# Patient Record
Sex: Female | Born: 1960
Health system: Southern US, Community
[De-identification: ages and names within clinical notes are randomized; demographics above are authoritative.]

## PROBLEM LIST (undated history)

## (undated) DIAGNOSIS — R112 Nausea with vomiting, unspecified: Secondary | ICD-10-CM

## (undated) DIAGNOSIS — B019 Varicella without complication: Secondary | ICD-10-CM

## (undated) DIAGNOSIS — I471 Supraventricular tachycardia, unspecified: Secondary | ICD-10-CM

## (undated) DIAGNOSIS — Z8742 Personal history of other diseases of the female genital tract: Secondary | ICD-10-CM

## (undated) DIAGNOSIS — N809 Endometriosis, unspecified: Secondary | ICD-10-CM

## (undated) DIAGNOSIS — M419 Scoliosis, unspecified: Secondary | ICD-10-CM

## (undated) DIAGNOSIS — N816 Rectocele: Secondary | ICD-10-CM

## (undated) DIAGNOSIS — N393 Stress incontinence (female) (male): Secondary | ICD-10-CM

## (undated) DIAGNOSIS — C4490 Unspecified malignant neoplasm of skin, unspecified: Secondary | ICD-10-CM

## (undated) DIAGNOSIS — T7840XA Allergy, unspecified, initial encounter: Secondary | ICD-10-CM

## (undated) DIAGNOSIS — N951 Menopausal and female climacteric states: Secondary | ICD-10-CM

## (undated) DIAGNOSIS — IMO0002 Reserved for concepts with insufficient information to code with codable children: Secondary | ICD-10-CM

## (undated) DIAGNOSIS — Z973 Presence of spectacles and contact lenses: Secondary | ICD-10-CM

## (undated) DIAGNOSIS — J45909 Unspecified asthma, uncomplicated: Secondary | ICD-10-CM

## (undated) DIAGNOSIS — R32 Unspecified urinary incontinence: Secondary | ICD-10-CM

## (undated) DIAGNOSIS — Z974 Presence of external hearing-aid: Secondary | ICD-10-CM

## (undated) DIAGNOSIS — K589 Irritable bowel syndrome without diarrhea: Secondary | ICD-10-CM

## (undated) DIAGNOSIS — E0500 Thyrotoxicosis with diffuse goiter without thyrotoxic crisis or storm: Secondary | ICD-10-CM

## (undated) DIAGNOSIS — Z83719 Family history of colon polyps, unspecified: Secondary | ICD-10-CM

## (undated) DIAGNOSIS — E785 Hyperlipidemia, unspecified: Secondary | ICD-10-CM

## (undated) DIAGNOSIS — Z9889 Other specified postprocedural states: Secondary | ICD-10-CM

## (undated) DIAGNOSIS — N390 Urinary tract infection, site not specified: Secondary | ICD-10-CM

## (undated) DIAGNOSIS — H7291 Unspecified perforation of tympanic membrane, right ear: Secondary | ICD-10-CM

## (undated) DIAGNOSIS — Z8371 Family history of colonic polyps: Secondary | ICD-10-CM

## (undated) DIAGNOSIS — M1990 Unspecified osteoarthritis, unspecified site: Secondary | ICD-10-CM

## (undated) HISTORY — DX: Varicella without complication: B01.9

## (undated) HISTORY — DX: Hyperlipidemia, unspecified: E78.5

## (undated) HISTORY — DX: Supraventricular tachycardia, unspecified: I47.10

## (undated) HISTORY — DX: Supraventricular tachycardia: I47.1

## (undated) HISTORY — DX: Allergy, unspecified, initial encounter: T78.40XA

## (undated) HISTORY — DX: Unspecified malignant neoplasm of skin, unspecified: C44.90

## (undated) HISTORY — DX: Unspecified urinary incontinence: R32

## (undated) HISTORY — PX: NASOPHARYNGOSCOPY EUSTATION TUBE BALLOON DILATION: SHX6729

## (undated) HISTORY — DX: Stress incontinence (female) (male): N39.3

## (undated) HISTORY — DX: Thyrotoxicosis with diffuse goiter without thyrotoxic crisis or storm: E05.00

## (undated) HISTORY — DX: Family history of colon polyps, unspecified: Z83.719

## (undated) HISTORY — DX: Other specified postprocedural states: Z98.890

## (undated) HISTORY — DX: Urinary tract infection, site not specified: N39.0

## (undated) HISTORY — PX: EYE SURGERY: SHX253

## (undated) HISTORY — PX: THYROIDECTOMY, PARTIAL: SHX18

## (undated) HISTORY — DX: Endometriosis, unspecified: N80.9

## (undated) HISTORY — DX: Family history of colonic polyps: Z83.71

## (undated) HISTORY — DX: Personal history of other diseases of the female genital tract: Z87.42

## (undated) HISTORY — DX: Reserved for concepts with insufficient information to code with codable children: IMO0002

## (undated) HISTORY — DX: Unspecified asthma, uncomplicated: J45.909

## (undated) HISTORY — DX: Rectocele: N81.6

## (undated) HISTORY — PX: TONSILLECTOMY AND ADENOIDECTOMY: SUR1326

## (undated) HISTORY — DX: Irritable bowel syndrome, unspecified: K58.9

## (undated) HISTORY — DX: Menopausal and female climacteric states: N95.1

## (undated) HISTORY — PX: OTHER SURGICAL HISTORY: SHX169

---

## 2004-10-26 ENCOUNTER — Ambulatory Visit: Payer: Self-pay | Admitting: Obstetrics and Gynecology

## 2005-03-01 ENCOUNTER — Ambulatory Visit: Payer: Self-pay | Admitting: Unknown Physician Specialty

## 2005-11-11 ENCOUNTER — Ambulatory Visit: Payer: Self-pay | Admitting: Obstetrics and Gynecology

## 2006-11-14 ENCOUNTER — Ambulatory Visit: Payer: Self-pay | Admitting: Obstetrics and Gynecology

## 2007-11-22 ENCOUNTER — Ambulatory Visit: Payer: Self-pay | Admitting: Obstetrics and Gynecology

## 2008-01-29 ENCOUNTER — Ambulatory Visit: Payer: Self-pay | Admitting: Unknown Physician Specialty

## 2008-02-05 ENCOUNTER — Ambulatory Visit: Payer: Self-pay | Admitting: Gastroenterology

## 2008-04-01 ENCOUNTER — Ambulatory Visit: Payer: Self-pay | Admitting: Unknown Physician Specialty

## 2008-05-01 ENCOUNTER — Ambulatory Visit: Payer: Self-pay | Admitting: Unknown Physician Specialty

## 2008-10-09 ENCOUNTER — Ambulatory Visit: Payer: Self-pay | Admitting: Internal Medicine

## 2008-11-25 ENCOUNTER — Ambulatory Visit: Payer: Self-pay

## 2008-12-02 ENCOUNTER — Ambulatory Visit: Payer: Self-pay | Admitting: Obstetrics and Gynecology

## 2009-02-14 ENCOUNTER — Other Ambulatory Visit: Payer: Self-pay | Admitting: General Practice

## 2009-02-19 ENCOUNTER — Ambulatory Visit: Payer: Self-pay | Admitting: General Practice

## 2009-12-15 ENCOUNTER — Ambulatory Visit: Payer: Self-pay | Admitting: Obstetrics and Gynecology

## 2009-12-16 ENCOUNTER — Ambulatory Visit: Payer: Self-pay | Admitting: Obstetrics and Gynecology

## 2009-12-17 ENCOUNTER — Ambulatory Visit: Payer: Self-pay | Admitting: Obstetrics and Gynecology

## 2009-12-19 ENCOUNTER — Ambulatory Visit: Payer: Self-pay | Admitting: Obstetrics and Gynecology

## 2010-01-13 ENCOUNTER — Ambulatory Visit: Payer: Self-pay | Admitting: Physician Assistant

## 2010-01-14 ENCOUNTER — Ambulatory Visit: Payer: Self-pay | Admitting: General Practice

## 2010-02-04 ENCOUNTER — Ambulatory Visit: Payer: Self-pay | Admitting: Cardiology

## 2011-01-12 ENCOUNTER — Ambulatory Visit: Payer: Self-pay | Admitting: Obstetrics and Gynecology

## 2011-05-20 ENCOUNTER — Ambulatory Visit: Payer: Self-pay

## 2011-05-24 ENCOUNTER — Ambulatory Visit: Payer: Self-pay

## 2011-08-08 ENCOUNTER — Emergency Department: Payer: Self-pay | Admitting: Emergency Medicine

## 2011-08-24 HISTORY — PX: OTHER SURGICAL HISTORY: SHX169

## 2011-09-02 ENCOUNTER — Ambulatory Visit: Payer: Self-pay | Admitting: General Practice

## 2011-09-15 ENCOUNTER — Ambulatory Visit: Payer: Self-pay | Admitting: General Practice

## 2011-09-15 LAB — BASIC METABOLIC PANEL
Anion Gap: 7 (ref 7–16)
BUN: 11 mg/dL (ref 7–18)
Calcium, Total: 9.7 mg/dL (ref 8.5–10.1)
Chloride: 102 mmol/L (ref 98–107)
Creatinine: 0.59 mg/dL — ABNORMAL LOW (ref 0.60–1.30)
EGFR (African American): >60
EGFR (Non-African Amer.): >60
Glucose: 87 mg/dL (ref 65–99)
Osmolality: 280 (ref 275–301)

## 2011-09-15 LAB — HEMOGLOBIN: HGB: 13.9 g/dL (ref 12.0–16.0)

## 2011-09-27 ENCOUNTER — Ambulatory Visit: Payer: Self-pay | Admitting: General Practice

## 2012-01-26 ENCOUNTER — Ambulatory Visit: Payer: Self-pay | Admitting: Obstetrics and Gynecology

## 2012-04-18 ENCOUNTER — Encounter: Payer: Self-pay | Admitting: General Practice

## 2012-04-23 ENCOUNTER — Encounter: Payer: Self-pay | Admitting: General Practice

## 2012-05-24 DIAGNOSIS — N3946 Mixed incontinence: Secondary | ICD-10-CM | POA: Insufficient documentation

## 2012-05-24 DIAGNOSIS — R339 Retention of urine, unspecified: Secondary | ICD-10-CM | POA: Insufficient documentation

## 2012-05-24 DIAGNOSIS — N3020 Other chronic cystitis without hematuria: Secondary | ICD-10-CM | POA: Insufficient documentation

## 2012-06-09 ENCOUNTER — Other Ambulatory Visit: Payer: Self-pay | Admitting: Physician Assistant

## 2012-06-09 LAB — CBC WITH DIFFERENTIAL/PLATELET
Basophil #: 0 10*3/uL (ref 0.0–0.1)
Eosinophil %: 1.1 %
HCT: 40.5 % (ref 35.0–47.0)
Lymphocyte #: 1.8 10*3/uL (ref 1.0–3.6)
MCH: 29.3 pg (ref 26.0–34.0)
MCHC: 34.4 g/dL (ref 32.0–36.0)
MCV: 85 fL (ref 80–100)
Monocyte #: 0.6 x10 3/mm (ref 0.2–0.9)
Monocyte %: 9.2 %
Neutrophil #: 3.9 10*3/uL (ref 1.4–6.5)
Neutrophil %: 61.3 %
Platelet: 241 10*3/uL (ref 150–440)
RBC: 4.76 10*6/uL (ref 3.80–5.20)
WBC: 6.3 10*3/uL (ref 3.6–11.0)

## 2012-06-09 LAB — COMPREHENSIVE METABOLIC PANEL
BUN: 12 mg/dL (ref 7–18)
Bilirubin,Total: 0.6 mg/dL (ref 0.2–1.0)
Calcium, Total: 8.6 mg/dL (ref 8.5–10.1)
Chloride: 105 mmol/L (ref 98–107)
Creatinine: 0.61 mg/dL (ref 0.60–1.30)
EGFR (African American): >60
Potassium: 4.4 mmol/L (ref 3.5–5.1)
SGPT (ALT): 26 U/L (ref 12–78)
Total Protein: 6.6 g/dL (ref 6.4–8.2)

## 2012-06-09 LAB — TSH: Thyroid Stimulating Horm: 0.863 u[IU]/mL

## 2012-06-09 LAB — LIPID PANEL
Cholesterol: 187 mg/dL (ref 0–200)
Ldl Cholesterol, Calc: 127 mg/dL — ABNORMAL HIGH (ref 0–100)
VLDL Cholesterol, Calc: 11 mg/dL (ref 5–40)

## 2012-06-23 ENCOUNTER — Ambulatory Visit: Payer: Self-pay | Admitting: General Practice

## 2013-02-16 ENCOUNTER — Ambulatory Visit: Payer: Self-pay | Admitting: Obstetrics and Gynecology

## 2013-04-23 LAB — HM COLONOSCOPY: HM Colonoscopy: 1

## 2013-05-21 ENCOUNTER — Ambulatory Visit: Payer: Self-pay | Admitting: Unknown Physician Specialty

## 2013-05-23 LAB — PATHOLOGY REPORT

## 2014-01-18 ENCOUNTER — Encounter: Payer: Self-pay | Admitting: Family Medicine

## 2014-01-18 ENCOUNTER — Ambulatory Visit (INDEPENDENT_AMBULATORY_CARE_PROVIDER_SITE_OTHER): Payer: 59 | Admitting: Family Medicine

## 2014-01-18 ENCOUNTER — Other Ambulatory Visit (INDEPENDENT_AMBULATORY_CARE_PROVIDER_SITE_OTHER): Payer: 59

## 2014-01-18 VITALS — BP 120/84 | HR 91 | Ht 62.0 in | Wt 127.0 lb

## 2014-01-18 DIAGNOSIS — S86819A Strain of other muscle(s) and tendon(s) at lower leg level, unspecified leg, initial encounter: Secondary | ICD-10-CM | POA: Insufficient documentation

## 2014-01-18 DIAGNOSIS — M79661 Pain in right lower leg: Secondary | ICD-10-CM

## 2014-01-18 DIAGNOSIS — M79609 Pain in unspecified limb: Secondary | ICD-10-CM

## 2014-01-18 DIAGNOSIS — S838X9A Sprain of other specified parts of unspecified knee, initial encounter: Secondary | ICD-10-CM

## 2014-01-18 DIAGNOSIS — S86119A Strain of other muscle(s) and tendon(s) of posterior muscle group at lower leg level, unspecified leg, initial encounter: Secondary | ICD-10-CM | POA: Insufficient documentation

## 2014-01-18 NOTE — Patient Instructions (Signed)
Very good to meet you Ice 20 minutes 2 times a day Wear compression with activity  Wear heel lift in shoe for now Meloxicam daily for 10 days.  Exercises starting tomorrow and most days of the week OK to bike or maybe try walking a pool.  Come back in 2-3 weeks.

## 2014-01-18 NOTE — Progress Notes (Signed)
  [facility] [place], [place] [address] Phone: [phone] Subjective:     CC:  Leg pain  [id number] [name] is a 53 y.o. female coming in with complaint of leg pain. This right-sided. Started approximately 10 days ago. Patient was running and felt a sharp pain in the back of her leg. Patient did not notice an audible pop. Patient had difficulty to bearing weight and had to call her husband to pick her up. Since that time she has been walking with a limp. Patient denies any swelling or any discoloration. Denies any radiation to the foot or any numbness. Patient has not tried any home modalities at this time. Patient rates the severity a 7/10 and states that she can rest comfortably. Pain is only with ambulation.     Past medical history, social, surgical and family history all reviewed in electronic medical record.   Review of Systems: No headache, visual changes, nausea, vomiting, diarrhea, constipation, dizziness, abdominal pain, skin rash, fevers, chills, night sweats, weight loss, swollen lymph nodes, body aches, joint swelling, muscle aches, chest pain, shortness of breath, mood changes.   Objective Blood pressure 120/84, pulse 91, height 5\' 2"  (1.575 m), weight 127 lb (57.607 kg), SpO2 99.00%.  General: No apparent distress alert and oriented x3 mood and affect normal, dressed appropriately.  HEENT: Pupils equal, extraocular movements intact  Respiratory: Patient's speak in full sentences and does not appear short of breath  Cardiovascular: No lower extremity edema, non tender, no erythema  Skin: Warm dry intact with no signs of infection or rash on extremities or on axial skeleton.  Abdomen: Soft nontender  Neuro: Cranial nerves II through XII are intact, neurovascularly intact in all extremities with 2+ DTRs and 2+ pulses.  Lymph: No lymphadenopathy of posterior or anterior cervical chain or axillae bilaterally.  Gait normal  with good balance and coordination.  MSK:  Non tender with full range of motion and good stability and symmetric strength and tone of shoulders, elbows, wrist, hip, and ankles bilaterally.   Knee: Right Normal to inspection with no erythema or effusion or obvious bony abnormalities. Palpation normal with no warmth, joint line tenderness, patellar tenderness, or condyle tenderness. ROM full in flexion and extension and lower leg rotation. Ligaments with solid consistent endpoints including ACL, PCL, LCL, MCL. Negative Mcmurray's, Apley's, and Thessalonian tests. Non painful patellar compression. Patellar glide without crepitus. Patellar and quadriceps tendons unremarkable. Hamstring and quadriceps strength is normal.  Patient does have severe tenderness to of the gastrocnemius mostly on the medial gastroc head. Contralateral knee and gastrocnemius unremarkable.  MSK US performed of: Right This study was ordered, performed, and interpreted by Charlann Boxer D.O.  Knee: All structures visualized. Anteromedial, anterolateral, posteromedial, and posterolateral menisci unremarkable without tearing, fraying, effusion, or displacement. Patellar Tendon unremarkable on long and transverse views without effusion. No abnormality of prepatellar bursa. LCL and MCL unremarkable on long and transverse views. Patient midsubstance of the medial gastroc head does have a small tear with no significant hypoechoic changes in this area. This measures approximately 2 cm in length. Increasing Doppler flow noted.  IMPRESSION:  Intersubstance tear of the medial head of the gastrocnemius      Impression and Recommendations:     This case required medical decision making of moderate complexity.

## 2014-01-18 NOTE — Assessment & Plan Note (Signed)
Patient does have a tear of the gastrocnemius muscle. Patient was given illness today, compression sleeve, icing protocol and home exercise program. Patient will try these interventions and come back again in 2-3 weeks for further evaluation and treatment.

## 2014-01-22 ENCOUNTER — Telehealth: Payer: Self-pay | Admitting: *Deleted

## 2014-01-22 MED ORDER — MELOXICAM 15 MG PO TABS: 15.0000 mg | ORAL_TABLET | Freq: Every day | ORAL | Status: DC

## 2014-01-22 NOTE — Telephone Encounter (Signed)
Pt called states she was to believe she was being prescribed Meloxicam.  Please send rx to [facility] [phone], [phone].

## 2014-01-22 NOTE — Telephone Encounter (Signed)
rx sent to pharmacy

## 2014-01-29 LAB — HM PAP SMEAR: HM Pap smear: NEGATIVE

## 2014-02-01 ENCOUNTER — Ambulatory Visit (INDEPENDENT_AMBULATORY_CARE_PROVIDER_SITE_OTHER): Payer: 59 | Admitting: Family Medicine

## 2014-02-01 ENCOUNTER — Encounter: Payer: Self-pay | Admitting: Family Medicine

## 2014-02-01 VITALS — BP 114/82 | HR 86 | Ht 62.0 in | Wt 126.0 lb

## 2014-02-01 DIAGNOSIS — S86119A Strain of other muscle(s) and tendon(s) of posterior muscle group at lower leg level, unspecified leg, initial encounter: Secondary | ICD-10-CM

## 2014-02-01 DIAGNOSIS — S838X9A Sprain of other specified parts of unspecified knee, initial encounter: Secondary | ICD-10-CM

## 2014-02-01 DIAGNOSIS — S86819A Strain of other muscle(s) and tendon(s) at lower leg level, unspecified leg, initial encounter: Secondary | ICD-10-CM

## 2014-02-01 NOTE — Progress Notes (Signed)
  [name] [place], [place] [address] Phone: [phone] Subjective:     CC:  Leg pain follow up  [id number] [name] is a 53 y.o. female coming in with complaint of leg pain. Patient was seen previously and was diagnosed with a gastrocnemius tear. Patient was given a heel lifts, we discussed compression, icing as well as a home exercise program slowly. Patient was also given a short burst of anti-inflammatories. Patient states she is approximately 90% better. Patient has been wearing a compression sleeve as well as the heel lift them in doing the exercises regularly. Patient is happy with results and no further pain. No new symptoms.     Past medical history, social, surgical and family history all reviewed in electronic medical record.   Review of Systems: No headache, visual changes, nausea, vomiting, diarrhea, constipation, dizziness, abdominal pain, skin rash, fevers, chills, night sweats, weight loss, swollen lymph nodes, body aches, joint swelling, muscle aches, chest pain, shortness of breath, mood changes.   Objective Blood pressure 114/82, pulse 86, height 5\' 2"  (1.575 m), weight 126 lb (57.153 kg), SpO2 99.00%.  General: No apparent distress alert and oriented x3 mood and affect normal, dressed appropriately.  HEENT: Pupils equal, extraocular movements intact  Respiratory: Patient's speak in full sentences and does not appear short of breath  Cardiovascular: No lower extremity edema, non tender, no erythema  Skin: Warm dry intact with no signs of infection or rash on extremities or on axial skeleton.  Abdomen: Soft nontender  Neuro: Cranial nerves II through XII are intact, neurovascularly intact in all extremities with 2+ DTRs and 2+ pulses.  Lymph: No lymphadenopathy of posterior or anterior cervical chain or axillae bilaterally.  Gait normal with good balance and coordination.  MSK:  Non tender with full range of motion  and good stability and symmetric strength and tone of shoulders, elbows, wrist, hip, and ankles bilaterally.   Knee: Right Normal to inspection with no erythema or effusion or obvious bony abnormalities. Palpation normal with no warmth, joint line tenderness, patellar tenderness, or condyle tenderness. ROM full in flexion and extension and lower leg rotation. Ligaments with solid consistent endpoints including ACL, PCL, LCL, MCL. Negative Mcmurray's, Apley's, and Thessalonian tests. Non painful patellar compression. Patellar glide without crepitus. Patellar and quadriceps tendons unremarkable. Hamstring and quadriceps strength is normal.  Patient is minimally tender over the medial gastroc head. Contralateral knee and gastrocnemius unremarkable.  MSK US performed of: Right This study was ordered, performed, and interpreted by Charlann Boxer D.O.  Knee: All structures visualized. Anteromedial, anterolateral, posteromedial, and posterolateral menisci unremarkable without tearing, fraying, effusion, or displacement. Patellar Tendon unremarkable on long and transverse views without effusion. No abnormality of prepatellar bursa. LCL and MCL unremarkable on long and transverse views. Patient midsubstance of the medial gastroc head does have a small tear but significant improvement from previous exam This measures approximately 0.5 cm in length. Increasing Doppler flow noted.  IMPRESSION:  Significant healing of gastrocnemius tear      Impression and Recommendations:     This case required medical decision making of moderate complexity.

## 2014-02-01 NOTE — Patient Instructions (Signed)
Great to see you! You are healing very fast.  Continue the compression sleeve with exercise for another 3 weeks.  The heel lift I think will still be beneficial for another couple weeks.  Starting in 1 week start the follow progression.  If not perfect in 3 weeks please come back.  Start a walk-run progression: - I would like you to do line drills (try to keep foot on a line when jogging). - Initially start one minute walking than one minute running for 20 mins in the first week,   then 25 mins during the second week, then 30 mins afterwards.  Once you have reached 30 mins: - Run 2 mins, then walk 1 min. -Then run 3 mins, and walk 1 min. -Then run 4 mins, and walk 1 min. -Then run 5 mins, and walk 1 min. -Slowly build up weekly to running 30 mins nonstop.  If painful at any of the steps, back up one step.

## 2014-02-01 NOTE — Assessment & Plan Note (Signed)
Overall patient is doing very well. Patient was given phase II exercises to do. We discussed strengthening as well we'll be helpful. Patient was also given Neurontin progression did start in one week's time. We discussed the importance of continuing compression as well as home exercises. Patient will followup again in 3 weeks.  Spent greater than 25 minutes with patient face-to-face and had greater than 50% of counseling including as described above in assessment and plan.

## 2014-02-18 ENCOUNTER — Ambulatory Visit: Payer: Self-pay | Admitting: Obstetrics and Gynecology

## 2014-02-18 LAB — HM MAMMOGRAPHY

## 2014-02-26 ENCOUNTER — Ambulatory Visit: Payer: 59 | Admitting: Family Medicine

## 2014-03-11 ENCOUNTER — Encounter: Payer: Self-pay | Admitting: Obstetrics and Gynecology

## 2014-03-23 ENCOUNTER — Encounter: Payer: Self-pay | Admitting: Obstetrics and Gynecology

## 2014-04-23 ENCOUNTER — Encounter: Payer: Self-pay | Admitting: Obstetrics and Gynecology

## 2014-06-20 ENCOUNTER — Ambulatory Visit: Payer: Self-pay | Admitting: Cardiology

## 2014-09-09 ENCOUNTER — Ambulatory Visit: Payer: Self-pay | Admitting: Physician Assistant

## 2014-09-09 LAB — URINALYSIS, COMPLETE
BILIRUBIN, UR: NEGATIVE
GLUCOSE, UR: NEGATIVE
KETONE: NEGATIVE
Nitrite: POSITIVE
PH: 6.5 (ref 5.0–8.0)
Protein: NEGATIVE
Specific Gravity: 1.01 (ref 1.000–1.030)
WBC UR: >30 /HPF (ref 0–5)

## 2014-09-12 LAB — URINE CULTURE

## 2014-12-15 NOTE — Op Note (Signed)
PATIENT NAME:  [name], [name] MR#:  [id number] DATE OF BIRTH:  31-Oct-1960  DATE OF PROCEDURE:  09/27/2011  PREOPERATIVE DIAGNOSIS: Internal derangement of the left knee.   POSTOPERATIVE DIAGNOSIS: Tear of the posterior horn medial meniscus, left knee.   PROCEDURE PERFORMED: Left knee arthroscopy and partial medial meniscectomy.   SURGEON: Laurice Record. Holley Bouche., MD  ANESTHESIA: General.   ESTIMATED BLOOD LOSS: Minimal.   TOURNIQUET TIME: Not used.   DRAINS: None.   INDICATIONS FOR SURGERY: The patient is a 54 year old female who has been seen for complaints of left knee pain, catching, near giving way. MRI demonstrated findings consistent with meniscal pathology. After discussion of the risks and benefits of surgical intervention, the patient expressed her understanding of the risks and benefits and agreed with plans for surgical intervention.   PROCEDURE IN DETAIL: Patient was brought into the Operating Room and, after adequate general anesthesia was achieved, a tourniquet was placed on the patient's left thigh and leg was placed in a leg holder. All bony prominences were well padded. The patient's left knee and leg were cleaned and prepped with alcohol and DuraPrep, draped in the usual sterile fashion. A "timeout" was performed as per usual protocol. The anticipated portal sites were injected with 0.25% Marcaine with epinephrine. An anterolateral portal was created and cannula was inserted. A small effusion was evacuated. The scope was inserted and the knee was distended with fluid using the DePuy Mitek pump. Scope was advanced down the medial gutter into the medial compartment of the knee. Under visualization with the scope, an anteromedial portal was created and a hook probe was inserted. Inspection of the medial compartment demonstrated a large flap-type lesion that extended from the posterior horn of the medial meniscus into the medial gutter. The flap-type lesion was debrided and excised  using combination of meniscal punches and 4.5 mm incisor shaver. Some degenerative changes are also noted to the posterior horn of the medial meniscus. These areas were debrided and contoured using meniscal punches and 4.5 mm shaver. Final contouring was performed using an ArthroCare wand. The remaining rim of meniscus was visualized and probed and felt to be stable. The articular surface was in excellent condition. Scope was then advanced into the intercondylar region. Anterior cruciate ligament was visualized and probed and felt to be stable. Scope was removed from anterolateral portal and reinserted via the anteromedial portal so as to better visualize the lateral compartment. The articular surface was in excellent condition. Lateral meniscus was visualized and probed and felt to be stable. Finally, scope was positioned so as to visualize the patellofemoral articulation. Good patellar tracking was appreciated. Articular surface was in good condition.   The knee was irrigated with copious amounts of fluid then suctioned dry. The anterolateral portal was reapproximated using 3-0 nylon. A combination of 0.25% Marcaine with epinephrine and 4 mg of morphine was injected via the scope. Scope was removed and the anteromedial portal was reapproximated using 3-0 nylon. A sterile dressing was applied followed by application of an ice wrap.   Patient tolerated procedure well. She was transported to the recovery room in stable condition.   ____________________________ Laurice Record. Holley Bouche., MD jph:cms D: 09/27/2011 18:17:07 ET T: 09/28/2011 08:28:29 ET JOB#: [id number]  cc: Laurice Record. Holley Bouche., MD, <Dictator> Laurice Record Holley Bouche MD ELECTRONICALLY SIGNED 10/03/2011 9:24

## 2015-02-04 ENCOUNTER — Encounter: Payer: Self-pay | Admitting: Obstetrics and Gynecology

## 2015-03-19 ENCOUNTER — Encounter: Payer: Self-pay | Admitting: Obstetrics and Gynecology

## 2015-03-19 ENCOUNTER — Ambulatory Visit (INDEPENDENT_AMBULATORY_CARE_PROVIDER_SITE_OTHER): Payer: 59 | Admitting: Obstetrics and Gynecology

## 2015-03-19 VITALS — BP 119/78 | HR 99 | Ht 62.0 in | Wt 132.4 lb

## 2015-03-19 DIAGNOSIS — Z1231 Encounter for screening mammogram for malignant neoplasm of breast: Secondary | ICD-10-CM | POA: Diagnosis not present

## 2015-03-19 DIAGNOSIS — Z1211 Encounter for screening for malignant neoplasm of colon: Secondary | ICD-10-CM

## 2015-03-19 DIAGNOSIS — N390 Urinary tract infection, site not specified: Secondary | ICD-10-CM | POA: Diagnosis not present

## 2015-03-19 DIAGNOSIS — N809 Endometriosis, unspecified: Secondary | ICD-10-CM

## 2015-03-19 DIAGNOSIS — Z0000 Encounter for general adult medical examination without abnormal findings: Secondary | ICD-10-CM | POA: Diagnosis not present

## 2015-03-19 DIAGNOSIS — Z01419 Encounter for gynecological examination (general) (routine) without abnormal findings: Secondary | ICD-10-CM

## 2015-03-19 DIAGNOSIS — Z803 Family history of malignant neoplasm of breast: Secondary | ICD-10-CM

## 2015-03-19 DIAGNOSIS — K589 Irritable bowel syndrome without diarrhea: Secondary | ICD-10-CM | POA: Diagnosis not present

## 2015-03-19 DIAGNOSIS — R159 Full incontinence of feces: Secondary | ICD-10-CM | POA: Diagnosis not present

## 2015-03-19 NOTE — Progress Notes (Signed)
Patient ID: [name], female   DOB: 15-Aug-1961, 54 y.o.   MRN: [id number] ANNUAL PREVENTATIVE CARE GYN  ENCOUNTER NOTE  Subjective:       [name] is a 54 y.o. [id number] female here for a routine annual gynecologic exam.  Current complaints: 1.  Hot flashes- 1 a week was having them qd for several months 2.  Wt gain- up 6 # 3.  Bowel incontinence - colonoscopy 2 years ago wnl; symptoms developed after resolution of SUI from physical therapy consultation   Gynecologic History Patient's last menstrual period was 07/01/2014 (exact date). Contraception: vasectomy Last Pap: 01/29/2014 neg/neg. Results were: normal Last mammogram: 02/19/2015 birad 1. Results were: normal  Obstetric History OB History  Gravida Para Term Preterm AB SAB TAB Ectopic Multiple Living  3 3 3       3     # Outcome Date GA Lbr Len/2nd Weight Sex Delivery Anes PTL Lv  3 Term 08/08/96    F CS-Unspec   Y  2 Term 08/23/95    Jerilynn Mages CS-Unspec   Y  1 Term 10/25/93    M CS-Unspec   Y      Past Medical History  Diagnosis Date  . Asthma   . Chicken pox   . Allergy   . Family history of polyps in the colon   . Urine incontinence   . Chronic UTI   . History of heavy periods   . SVT (supraventricular tachycardia)   . Endometriosis   . SUI (stress urinary incontinence, female)   . S/P endometrial ablation   . Perimenopausal   . Cystocele     2nd degree  . Rectocele     mild  . Hyperlipemia     Past Surgical History  Procedure Laterality Date  . Thyroidectomy, partial    . Eye surgery    . Tonsillectomy and adenoidectomy      Current Outpatient Prescriptions on File Prior to Visit  Medication Sig Dispense Refill  . albuterol (PROVENTIL HFA;VENTOLIN HFA) 108 (90 BASE) MCG/ACT inhaler Inhale 2 puffs into the lungs every 6 (six) hours as needed for wheezing or shortness of breath.    Marland Kitchen aspirin 81 MG tablet Take 81 mg by mouth daily.    . cholecalciferol (VITAMIN D) 1000 UNITS tablet Take 1,000 Units  by mouth daily.    . fexofenadine (ALLEGRA) 180 MG tablet Take 180 mg by mouth daily.    . fluticasone (FLONASE) 50 MCG/ACT nasal spray Place 1 spray into both nostrils daily.    Marland Kitchen imipramine (TOFRANIL) 25 MG tablet Take 50 mg by mouth at bedtime.    Marland Kitchen levothyroxine (SYNTHROID, LEVOTHROID) 88 MCG tablet Take 88 mcg by mouth daily before breakfast.    . metoprolol succinate (TOPROL-XL) 50 MG 24 hr tablet Take 50 mg by mouth daily. Take with or immediately following a meal.    . montelukast (SINGULAIR) 10 MG tablet Take 10 mg by mouth at bedtime.    . Multiple Vitamin (MULTIVITAMIN) tablet Take 1 tablet by mouth daily.     No current facility-administered medications on file prior to visit.    Allergies  Allergen Reactions  . Amoxicillin   . Bextra [Valdecoxib]   . Ciprofloxacin   . Floxin [Ofloxacin]   . Gyne-Lotrimin [Clotrimazole]   . Levaquin [Levofloxacin In D5w]   . Septra [Sulfamethoxazole-Trimethoprim]   . Sulfa Antibiotics Other (See Comments)    fever  . Penicillins Rash    History  Social History  . Marital Status: Married    Spouse Name: N/A  . Number of Children: N/A  . Years of Education: N/A   Occupational History  . [employer] History Main Topics  . Smoking status: Never Smoker   . Smokeless tobacco: Never Used  . Alcohol Use: No  . Drug Use: No  . Sexual Activity: Yes   Other Topics Concern  . Not on file   Social History Narrative    Family History  Problem Relation Age of Onset  . Cancer Mother   . Hyperlipidemia Mother   . Heart disease Mother   . Hypertension Mother   . Cancer Father   . Hyperlipidemia Father   . Heart disease Father   . Hypertension Father     The following portions of the patient's history were reviewed and updated as appropriate: allergies, current medications, past family history, past medical history, past social history, past surgical history and problem list.  Review of Systems ROS Review of  Systems - General ROS: negative for - chills, fatigue, fever, hot flashes, night sweats, weight gain or weight loss Psychological ROS: negative for - anxiety, decreased libido, depression, mood swings, physical abuse or sexual abuse Ophthalmic ROS: negative for - blurry vision, eye pain or loss of vision ENT ROS: negative for - headaches, hearing change, visual changes or vocal changes Allergy and Immunology ROS: negative for - hives, itchy/watery eyes or seasonal allergies Hematological and Lymphatic ROS: negative for - bleeding problems, bruising, swollen lymph nodes or weight loss Endocrine ROS: negative for - galactorrhea, hair pattern changes, hot flashes, malaise/lethargy, mood swings, palpitations, polydipsia/polyuria, skin changes, temperature intolerance or unexpected weight changes Breast ROS: negative for - new or changing breast lumps or nipple discharge Respiratory ROS: negative for - cough or shortness of breath Cardiovascular ROS: negative for - chest pain, irregular heartbeat, palpitations or shortness of breath Gastrointestinal ROS: no abdominal pain,, or black or bloody stools.  POSITIVE- change in bowel habits with stool incontinence Genito-Urinary ROS: no dysuria, trouble voiding, or hematuria Musculoskeletal ROS: negative for - joint pain or joint stiffness Neurological ROS: negative for abnormalbladder control changes.  POSITIVE-stool incontinence Dermatological ROS: negative for rash and skin lesion changes   Objective:   BP 119/78 mmHg  Pulse 99  Ht 5\' 2"  (1.575 m)  Wt 132 lb 6.4 oz (60.056 kg)  BMI 24.21 kg/m2  LMP 07/01/2014 (Exact Date) CONSTITUTIONAL: Well-developed, well-nourished female in no acute distress.  PSYCHIATRIC: Normal mood and affect. Normal behavior. Normal judgment and thought content. [place]: Alert and oriented to person, place, and time. Normal muscle tone coordination. No cranial nerve deficit noted. HENT:  Normocephalic, atraumatic,  External right and left ear normal. Oropharynx is clear and moist EYES: Conjunctivae and EOM are normal. Pupils are equal, round, and reactive to light. No scleral icterus.  NECK: Normal range of motion, supple, no masses.  Normal thyroid.  SKIN: Skin is warm and dry. No rash noted. Not diaphoretic. No erythema. No pallor. CARDIOVASCULAR: Normal heart rate noted, regular rhythm, no murmur. RESPIRATORY: Clear to auscultation bilaterally. Effort and breath sounds normal, no problems with respiration noted. BREASTS: Symmetric in size. No masses, skin changes, nipple drainage, or lymphadenopathy. ABDOMEN: Soft, normal bowel sounds, no distention noted.  No tenderness, rebound or guarding.  BLADDER: Normal PELVIC:  External Genitalia: Normal  BUS: Normal  Vagina: Normal  Cervix: Normal  Uterus: Normal  Adnexa: Normal  RV: External Exam NormaI, No Rectal Masses  and Normal Sphincter tone  MUSCULOSKELETAL: Normal range of motion. No tenderness.  No cyanosis, clubbing, or edema.  2+ distal pulses. LYMPHATIC: No Axillary, Supraclavicular, or Inguinal Adenopathy.    Assessment:   Annual gynecologic examination 54 y.o. Contraception: vasectomy Normal BMI Stool incontinence; unclear etiology; normal sphincter tone. SUI, resolved. History of endometriosis, asymptomatic Plan:  Pap: Not needed Mammogram: Ordered Stool Guaiac Testing:  Ordered Labs: thru pcp Routine preventative health maintenance measures emphasized: Exercise/Diet/Weight control, Tobacco Warnings, Alcohol/Substance use risks and Stress Management Monitor stool incontinence symptoms; consider following up with GI or colorectal surgery. Return to [place], [place]  Brayton Mars, MD

## 2015-03-21 DIAGNOSIS — R159 Full incontinence of feces: Secondary | ICD-10-CM | POA: Insufficient documentation

## 2015-03-21 DIAGNOSIS — K589 Irritable bowel syndrome without diarrhea: Secondary | ICD-10-CM | POA: Insufficient documentation

## 2015-03-21 DIAGNOSIS — E0500 Thyrotoxicosis with diffuse goiter without thyrotoxic crisis or storm: Secondary | ICD-10-CM | POA: Insufficient documentation

## 2015-03-21 DIAGNOSIS — J45909 Unspecified asthma, uncomplicated: Secondary | ICD-10-CM | POA: Insufficient documentation

## 2015-03-21 DIAGNOSIS — I471 Supraventricular tachycardia: Secondary | ICD-10-CM | POA: Insufficient documentation

## 2015-03-21 DIAGNOSIS — N809 Endometriosis, unspecified: Secondary | ICD-10-CM | POA: Insufficient documentation

## 2015-03-21 DIAGNOSIS — Z803 Family history of malignant neoplasm of breast: Secondary | ICD-10-CM | POA: Insufficient documentation

## 2015-03-21 DIAGNOSIS — N390 Urinary tract infection, site not specified: Secondary | ICD-10-CM | POA: Insufficient documentation

## 2015-03-26 ENCOUNTER — Encounter: Payer: Self-pay | Admitting: Obstetrics and Gynecology

## 2015-03-31 ENCOUNTER — Ambulatory Visit
Admission: RE | Admit: 2015-03-31 | Discharge: 2015-03-31 | Disposition: A | Discharge status: Home / Self Care | Payer: 59 | Source: Ambulatory Visit | Attending: Obstetrics and Gynecology | Admitting: Obstetrics and Gynecology

## 2015-03-31 ENCOUNTER — Other Ambulatory Visit: Payer: Self-pay | Admitting: Obstetrics and Gynecology

## 2015-03-31 DIAGNOSIS — Z1231 Encounter for screening mammogram for malignant neoplasm of breast: Secondary | ICD-10-CM | POA: Diagnosis not present

## 2015-04-26 LAB — FECAL OCCULT BLOOD, IMMUNOCHEMICAL: Fecal Occult Bld: NEGATIVE

## 2015-06-19 ENCOUNTER — Ambulatory Visit: Payer: Self-pay

## 2015-06-19 ENCOUNTER — Encounter: Payer: Self-pay | Admitting: Physician Assistant

## 2015-06-19 VITALS — BP 110/80 | HR 84 | Temp 97.9°F

## 2015-06-19 DIAGNOSIS — R300 Dysuria: Secondary | ICD-10-CM

## 2015-06-19 LAB — POCT URINALYSIS DIPSTICK
Bilirubin, UA: NEGATIVE
Glucose, UA: NEGATIVE
KETONES UA: NEGATIVE
Leukocytes, UA: NEGATIVE
Nitrite, UA: NEGATIVE
PH UA: 6
PROTEIN UA: NEGATIVE
RBC UA: NEGATIVE
Spec Grav, UA: 1.025
Urobilinogen, UA: 0.2

## 2015-06-19 NOTE — Progress Notes (Signed)
S: c/o low back pain, urine has strong odor, hx of multiple utis, use to be on macrodantin daily for chronic utis, followed by dr cope, home urine test had nitrites in it, no fever/chills/abd pain  O: vitals wnl, nad, ua wnl  A: well adult  P: recheck urine tomorrow

## 2015-07-04 ENCOUNTER — Ambulatory Visit: Payer: 59 | Admitting: Physician Assistant

## 2015-07-04 ENCOUNTER — Encounter: Payer: Self-pay | Admitting: Physician Assistant

## 2015-07-04 LAB — POCT URINALYSIS DIPSTICK
BILIRUBIN UA: NEGATIVE
Glucose, UA: NEGATIVE
KETONES UA: NEGATIVE
LEUKOCYTES UA: NEGATIVE
Nitrite, UA: POSITIVE
Protein, UA: NEGATIVE
Spec Grav, UA: 1.02
Urobilinogen, UA: 0.2
pH, UA: 6

## 2015-07-04 MED ORDER — NITROFURANTOIN MONOHYD MACRO 100 MG PO CAPS: 100.0000 mg | ORAL_CAPSULE | Freq: Every day | ORAL | Status: DC

## 2015-07-04 NOTE — Progress Notes (Signed)
Pt still having urinary odor, home tests are positive for leuks and nitrites, told pt to drop urine off in clinic  O: urine with + nitrites  A: uti secondary to urinary reflux  P: macrobid 100mg  qd

## 2015-10-15 DIAGNOSIS — E7800 Pure hypercholesterolemia, unspecified: Secondary | ICD-10-CM | POA: Diagnosis not present

## 2015-10-15 DIAGNOSIS — I471 Supraventricular tachycardia: Secondary | ICD-10-CM | POA: Diagnosis not present

## 2015-10-31 DIAGNOSIS — Z0000 Encounter for general adult medical examination without abnormal findings: Secondary | ICD-10-CM | POA: Diagnosis not present

## 2015-10-31 DIAGNOSIS — E7800 Pure hypercholesterolemia, unspecified: Secondary | ICD-10-CM | POA: Diagnosis not present

## 2015-11-05 DIAGNOSIS — R7303 Prediabetes: Secondary | ICD-10-CM | POA: Diagnosis not present

## 2015-11-05 DIAGNOSIS — Z8639 Personal history of other endocrine, nutritional and metabolic disease: Secondary | ICD-10-CM | POA: Diagnosis not present

## 2015-11-05 DIAGNOSIS — E7800 Pure hypercholesterolemia, unspecified: Secondary | ICD-10-CM | POA: Diagnosis not present

## 2015-11-05 DIAGNOSIS — Z0000 Encounter for general adult medical examination without abnormal findings: Secondary | ICD-10-CM | POA: Diagnosis not present

## 2015-11-06 ENCOUNTER — Other Ambulatory Visit: Payer: Self-pay | Admitting: Physician Assistant

## 2015-11-06 NOTE — Telephone Encounter (Signed)
Pt has hx of severe allergies to foods and meds Refill approved and sent to [facility]

## 2016-03-24 ENCOUNTER — Encounter: Payer: 59 | Admitting: Obstetrics and Gynecology

## 2016-03-31 ENCOUNTER — Encounter: Payer: 59 | Admitting: Obstetrics and Gynecology

## 2016-04-05 NOTE — Progress Notes (Signed)
ANNUAL PREVENTATIVE CARE GYN  ENCOUNTER NOTE  Subjective:       [name] is a 55 y.o. G73P3003 female here for a routine annual gynecologic exam.  Current complaints: 1.   SUI  Menopause occurred in November 2015; no postmenopausal bleeding Vasomotor symptoms are mild; patient does not desire any intervention Vaginal atrophy symptoms are mild. Patient does have mild leaking of urine with coughing and sneezing; physical therapy consultation in the past has helped these symptoms.  Gynecologic History No LMP recorded. Patient is not currently having periods (Reason: Perimenopausal). Contraception: post menopausal status November 2015 Last Pap: 01/2014 neg/neg. Results were: normal Last mammogram: 03/2015. Results were: normal  Obstetric History OB History  Gravida Para Term Preterm AB Living  3 3 3     3   SAB TAB Ectopic Multiple Live Births          3    # Outcome Date GA Lbr Len/2nd Weight Sex Delivery Anes PTL Lv  3 Term 08/08/96    F CS-Unspec   LIV  2 Term 08/23/95    M CS-Unspec   LIV  1 Term 10/25/93    M CS-Unspec   LIV      Past Medical History:  Diagnosis Date  . Allergy   . Asthma   . Chicken pox   . Chronic UTI   . Cystocele    2nd degree  . Endometriosis   . Family history of polyps in the colon   . History of heavy periods   . Hyperlipemia   . Perimenopausal   . Rectocele    mild  . S/P endometrial ablation   . SUI (stress urinary incontinence, female)   . SVT (supraventricular tachycardia) ([place])   . Urine incontinence     Past Surgical History:  Procedure Laterality Date  . EYE SURGERY    . THYROIDECTOMY, PARTIAL    . TONSILLECTOMY AND ADENOIDECTOMY      Current Outpatient Prescriptions on File Prior to Visit  Medication Sig Dispense Refill  . albuterol (PROVENTIL HFA;VENTOLIN HFA) 108 (90 BASE) MCG/ACT inhaler Inhale 2 puffs into the lungs every 6 (six) hours as needed for wheezing or shortness of breath.    Marland Kitchen aspirin 81 MG tablet Take  81 mg by mouth daily.    . cholecalciferol (VITAMIN D) 1000 UNITS tablet Take 1,000 Units by mouth daily.    Marland Kitchen EPIPEN 2-PAK 0.3 MG/0.3ML SOAJ injection USE FOR ANAPHYLAXIS 2 Device 3  . fexofenadine (ALLEGRA) 180 MG tablet Take 180 mg by mouth daily.    . fluticasone (FLONASE) 50 MCG/ACT nasal spray Place 1 spray into both nostrils daily.    Marland Kitchen imipramine (TOFRANIL) 25 MG tablet Take 50 mg by mouth at bedtime.    Marland Kitchen levothyroxine (SYNTHROID, LEVOTHROID) 88 MCG tablet Take 88 mcg by mouth daily before breakfast.    . lovastatin (MEVACOR) 20 MG tablet Take by mouth.    . metoprolol succinate (TOPROL-XL) 50 MG 24 hr tablet Take 50 mg by mouth daily. Take with or immediately following a meal.    . montelukast (SINGULAIR) 10 MG tablet Take 10 mg by mouth at bedtime.    . Multiple Vitamin (MULTIVITAMIN) tablet Take 1 tablet by mouth daily.    . nitrofurantoin, macrocrystal-monohydrate, (MACROBID) 100 MG capsule Take 1 capsule (100 mg total) by mouth at bedtime. 30 capsule 6   No current facility-administered medications on file prior to visit.     Allergies  Allergen Reactions  .  Amoxicillin   . Bextra [Valdecoxib]   . Ciprofloxacin   . Floxin [Ofloxacin]   . Gyne-Lotrimin [Clotrimazole]   . Levaquin [Levofloxacin In D5w]   . Septra [Sulfamethoxazole-Trimethoprim]   . Sulfa Antibiotics Other (See Comments)    fever  . Penicillins Rash    Social History   Social History  . Marital status: Married    Spouse name: N/A  . Number of children: N/A  . Years of education: N/A   Occupational History  . [place] History Main Topics  . Smoking status: Never Smoker  . Smokeless tobacco: Never Used  . Alcohol use No  . Drug use: No  . Sexual activity: Yes   Other Topics Concern  . Not on file   Social History Narrative  . No narrative on file    Family History  Problem Relation Age of Onset  . Cancer Mother   . Hyperlipidemia Mother   . Heart disease Mother   .  Hypertension Mother   . Breast cancer Mother 56  . Cancer Father   . Hyperlipidemia Father   . Heart disease Father   . Hypertension Father   . Breast cancer Paternal Aunt 58    The following portions of the patient's history were reviewed and updated as appropriate: allergies, current medications, past family history, past medical history, past social history, past surgical history and problem list.  Review of Systems ROS Review of Systems - General ROS: negative for - chills, fatigue, fever,  night sweats, weight gain or weight loss. POSITIVE-hot flashes, Psychological ROS: negative for - anxiety, decreased libido, depression, mood swings, physical abuse or sexual abuse Ophthalmic ROS: negative for - blurry vision, eye pain or loss of vision ENT ROS: negative for - headaches, hearing change, visual changes or vocal changes Allergy and Immunology ROS: negative for - hives, itchy/watery eyes or seasonal allergies Hematological and Lymphatic ROS: negative for - bleeding problems, bruising, swollen lymph nodes or weight loss Endocrine ROS: negative for - galactorrhea, hair pattern changes, hot flashes, malaise/lethargy, mood swings, palpitations, polydipsia/polyuria, skin changes, temperature intolerance or unexpected weight changes Breast ROS: negative for - new or changing breast lumps or nipple discharge Respiratory ROS: negative for - cough or shortness of breath Cardiovascular ROS: negative for - chest pain, irregular heartbeat, palpitations or shortness of breath Gastrointestinal ROS: no abdominal pain, change in bowel habits, or black or bloody stools Genito-Urinary ROS: no dysuria, trouble voiding, or hematuria. POSITIVE mild stress incontinence Musculoskeletal ROS: negative for - joint pain or joint stiffness Neurological ROS: negative for - bowel and bladder control changes Dermatological ROS: negative for rash and skin lesion changes   Objective:  BP 120/82   Pulse 93   Ht 5'  2" (1.575 m)   Wt 136 lb 9.6 oz (62 kg)   BMI 24.98 kg/m  CONSTITUTIONAL: Well-developed, well-nourished female in no acute distress.  PSYCHIATRIC: Normal mood and affect. Normal behavior. Normal judgment and thought content. [place]: Alert and oriented to person, place, and time. Normal muscle tone coordination. No cranial nerve deficit noted. HENT:  Normocephalic, atraumatic, External right and left ear normal. Oropharynx is clear and moist EYES: Conjunctivae and EOM are normal. Pupils are equal, round, and reactive to light. No scleral icterus.  NECK: Normal range of motion, supple, no masses.  Normal thyroid.  SKIN: Skin is warm and dry. No rash noted. Not diaphoretic. No erythema. No pallor. CARDIOVASCULAR: Normal heart rate noted, regular rhythm, no murmur. RESPIRATORY:  Clear to auscultation bilaterally. Effort and breath sounds normal, no problems with respiration noted. BREASTS: Symmetric in size. No masses, skin changes, nipple drainage, or lymphadenopathy. ABDOMEN: Soft, normal bowel sounds, no distention noted.  No tenderness, rebound or guarding.  BLADDER: Normal PELVIC:  External Genitalia: Normal  BUS: Normal  Vagina: Normal estrogen effect; second-degree cystourethrocele  Cervix: Normal  Uterus: Normal; midplane, normal size and shape, mobile, nontender  Adnexa: Normal  RV: External Exam NormaI, No Rectal Masses and Normal Sphincter tone  MUSCULOSKELETAL: Normal range of motion. No tenderness.  No cyanosis, clubbing, or edema.  2+ distal pulses. LYMPHATIC: No Axillary, Supraclavicular, or Inguinal Adenopathy.    Assessment:   Annual gynecologic examination 55 y.o. Contraception: post menopausal status Normal BMI Problem List Items Addressed This Visit    Endometriosis   Family history of breast cancer    Other Visit Diagnoses    Well woman exam    -  Primary   Encounter for screening mammogram for breast cancer       Screening for colon cancer          Grade 2 cystocele Stress urinary incontinence Plan:  Pap: due 2018 Mammogram: Ordered Stool Guaiac Testing:  Ordered Labs: Not ordered Routine preventative health maintenance measures emphasized: Exercise/Diet/Weight control, Tobacco Warnings and Alcohol/Substance use risks  Kegel exercises; timed voiding Return to Clinic - [address] [place], CMA  Brayton Mars, MD

## 2016-04-07 ENCOUNTER — Encounter: Payer: Self-pay | Admitting: Obstetrics and Gynecology

## 2016-04-07 ENCOUNTER — Ambulatory Visit (INDEPENDENT_AMBULATORY_CARE_PROVIDER_SITE_OTHER): Payer: 59 | Admitting: Obstetrics and Gynecology

## 2016-04-07 VITALS — BP 120/82 | HR 93 | Ht 62.0 in | Wt 136.6 lb

## 2016-04-07 DIAGNOSIS — N809 Endometriosis, unspecified: Secondary | ICD-10-CM

## 2016-04-07 DIAGNOSIS — Z1231 Encounter for screening mammogram for malignant neoplasm of breast: Secondary | ICD-10-CM | POA: Diagnosis not present

## 2016-04-07 DIAGNOSIS — N8110 Cystocele, unspecified: Secondary | ICD-10-CM | POA: Diagnosis not present

## 2016-04-07 DIAGNOSIS — IMO0001 Reserved for inherently not codable concepts without codable children: Secondary | ICD-10-CM

## 2016-04-07 DIAGNOSIS — Z1211 Encounter for screening for malignant neoplasm of colon: Secondary | ICD-10-CM

## 2016-04-07 DIAGNOSIS — Z803 Family history of malignant neoplasm of breast: Secondary | ICD-10-CM

## 2016-04-07 DIAGNOSIS — Z0000 Encounter for general adult medical examination without abnormal findings: Secondary | ICD-10-CM

## 2016-04-07 DIAGNOSIS — N393 Stress incontinence (female) (male): Secondary | ICD-10-CM | POA: Diagnosis not present

## 2016-04-07 DIAGNOSIS — Z01419 Encounter for gynecological examination (general) (routine) without abnormal findings: Secondary | ICD-10-CM

## 2016-04-07 DIAGNOSIS — IMO0002 Reserved for concepts with insufficient information to code with codable children: Secondary | ICD-10-CM

## 2016-04-07 NOTE — Patient Instructions (Addendum)
1. No Pap smear is performed. 2. Mammogram is ordered 3. Stool guaiac card testing for colon cancer screening is ordered 4. Routine screening labs are not ordered 5. Continue with healthy eating and exercise 6. Return in 1 year for annual exam

## 2016-04-21 ENCOUNTER — Ambulatory Visit
Admission: RE | Admit: 2016-04-21 | Discharge: 2016-04-21 | Disposition: A | Discharge status: Home / Self Care | Payer: 59 | Source: Ambulatory Visit | Attending: Obstetrics and Gynecology | Admitting: Obstetrics and Gynecology

## 2016-04-21 ENCOUNTER — Other Ambulatory Visit: Payer: Self-pay | Admitting: Obstetrics and Gynecology

## 2016-04-21 DIAGNOSIS — Z01419 Encounter for gynecological examination (general) (routine) without abnormal findings: Secondary | ICD-10-CM

## 2016-04-21 DIAGNOSIS — Z1231 Encounter for screening mammogram for malignant neoplasm of breast: Secondary | ICD-10-CM

## 2016-04-21 DIAGNOSIS — Z803 Family history of malignant neoplasm of breast: Secondary | ICD-10-CM

## 2016-04-26 DIAGNOSIS — Z0000 Encounter for general adult medical examination without abnormal findings: Secondary | ICD-10-CM | POA: Diagnosis not present

## 2016-04-26 DIAGNOSIS — Z1211 Encounter for screening for malignant neoplasm of colon: Secondary | ICD-10-CM | POA: Diagnosis not present

## 2016-05-02 LAB — FECAL OCCULT BLOOD, IMMUNOCHEMICAL: Fecal Occult Bld: NEGATIVE

## 2016-05-07 DIAGNOSIS — R7303 Prediabetes: Secondary | ICD-10-CM | POA: Diagnosis not present

## 2016-05-07 DIAGNOSIS — Z8639 Personal history of other endocrine, nutritional and metabolic disease: Secondary | ICD-10-CM | POA: Diagnosis not present

## 2016-05-07 DIAGNOSIS — E7800 Pure hypercholesterolemia, unspecified: Secondary | ICD-10-CM | POA: Diagnosis not present

## 2016-05-12 DIAGNOSIS — R7303 Prediabetes: Secondary | ICD-10-CM | POA: Diagnosis not present

## 2016-05-12 DIAGNOSIS — E7800 Pure hypercholesterolemia, unspecified: Secondary | ICD-10-CM | POA: Diagnosis not present

## 2016-05-12 DIAGNOSIS — Z8639 Personal history of other endocrine, nutritional and metabolic disease: Secondary | ICD-10-CM | POA: Diagnosis not present

## 2016-05-12 DIAGNOSIS — M2540 Effusion, unspecified joint: Secondary | ICD-10-CM | POA: Diagnosis not present

## 2016-05-12 DIAGNOSIS — M151 Heberden's nodes (with arthropathy): Secondary | ICD-10-CM | POA: Diagnosis not present

## 2016-07-07 DIAGNOSIS — E7800 Pure hypercholesterolemia, unspecified: Secondary | ICD-10-CM | POA: Diagnosis not present

## 2016-07-07 DIAGNOSIS — Z8639 Personal history of other endocrine, nutritional and metabolic disease: Secondary | ICD-10-CM | POA: Diagnosis not present

## 2016-07-18 ENCOUNTER — Encounter: Payer: Self-pay | Admitting: Gynecology

## 2016-07-18 ENCOUNTER — Ambulatory Visit
Admission: EM | Admit: 2016-07-18 | Discharge: 2016-07-18 | Disposition: A | Discharge status: Home / Self Care | Payer: 59 | Attending: Emergency Medicine | Admitting: Emergency Medicine

## 2016-07-18 DIAGNOSIS — J0140 Acute pansinusitis, unspecified: Secondary | ICD-10-CM | POA: Diagnosis not present

## 2016-07-18 DIAGNOSIS — J45901 Unspecified asthma with (acute) exacerbation: Secondary | ICD-10-CM

## 2016-07-18 MED ORDER — AZITHROMYCIN 250 MG PO TABS: 250.0000 mg | ORAL_TABLET | Freq: Every day | ORAL | 0 refills | Status: DC

## 2016-07-18 MED ORDER — PREDNISONE 50 MG PO TABS: 50.0000 mg | ORAL_TABLET | Freq: Every day | ORAL | 0 refills | Status: DC

## 2016-07-18 NOTE — Discharge Instructions (Signed)
.   You may take 800 mg of motrin with 1 gram of tylenol up to 3 times a day as needed for pain. This is an effective combination for pain.  start Mucinex  Discontinue the Allegra for now. Use a neti pot or the NeilMed sinus rinse as often as you want to to reduce nasal congestion. Follow the directions on the box.   Take two puffs from your albuterol inhaler every 4 hours. Finish the steroids unless your doctor tells you to stop. Finish the antibiotics, even if you feel better. Do a peak flow, once the morning and once at night. Write this down. The number should be going up, not down. You may decrease the frequency of your albuterol inhaler as the numbers go up and you start feeling better.   Go to www.goodrx.com to look up your medications. This will give you a list of where you can find your prescriptions at the most affordable prices.

## 2016-07-18 NOTE — ED Triage Notes (Signed)
Patient c/o sinus infection and cough x 4 days. Per patient symptoms causing her asthma to flared up.

## 2016-07-18 NOTE — ED Provider Notes (Signed)
HPI  SUBJECTIVE:  [name] is a 55 y.o. female who presents with cough productive of yellowish sputum, nasal congestion, yellow rhinorrhea, postnasal drip, sinus pain and pressure, sore, scratchy throat, wheezing, chest tightness, shortness of breath with talking and coughing spells for the past 4 days. She tried her albuterol every 4 hours, states that she normally takes this as needed. She has also tried Flonase, Human resources officer, singulair, Tylenol Robitussin. Symptoms are better with cough syrup, worse with lying down. She denies fevers, pleuritic pain, chest pressure or heaviness, flu symptoms. No calf pain, swelling, recent immobilization, hemoptysis, surgeries in the past 4 weeks. No sick contacts. No antipyretic in the past 6-8 hours. She did get a flu shot this year. Patient states that she "needs a Z-Pak and prednisone". She states that this feels identical to previous asthma flares. She states that her baseline peak flows at home have been around 250-300. Her predicted peak flow is 390. She did take her inhaler this morning shortly prior to arrival. Did not take her metoprolol this morning. She has a past medical history including  asthma no intubations admissions or recent steroids. Also a history of sinusitis, SVT. No history of DVT, PE, hypercoagulability. She reports allergies to multiple antibiotics. PMD: Dion Body, MD     Past Medical History:  Diagnosis Date  . Allergy   . Asthma   . Chicken pox   . Chronic UTI   . Cystocele    2nd degree  . Endometriosis   . Family history of polyps in the colon   . History of heavy periods   . Hyperlipemia   . Perimenopausal   . Rectocele    mild  . S/P endometrial ablation   . SUI (stress urinary incontinence, female)   . SVT (supraventricular tachycardia) ([place])   . Urine incontinence     Past Surgical History:  Procedure Laterality Date  . EYE SURGERY    . THYROIDECTOMY, PARTIAL    . TONSILLECTOMY AND ADENOIDECTOMY       Family History  Problem Relation Age of Onset  . Cancer Mother   . Hyperlipidemia Mother   . Heart disease Mother   . Hypertension Mother   . Breast cancer Mother 26  . Cancer Father   . Hyperlipidemia Father   . Heart disease Father   . Hypertension Father   . Breast cancer Paternal Aunt 11  . Breast cancer Maternal Aunt 85    Social History  Substance Use Topics  . Smoking status: Never Smoker  . Smokeless tobacco: Never Used  . Alcohol use No    No current facility-administered medications for this encounter.   Current Outpatient Prescriptions:  .  albuterol (PROVENTIL HFA;VENTOLIN HFA) 108 (90 BASE) MCG/ACT inhaler, Inhale 2 puffs into the lungs every 6 (six) hours as needed for wheezing or shortness of breath., Disp: , Rfl:  .  aspirin 81 MG tablet, Take 81 mg by mouth daily., Disp: , Rfl:  .  cholecalciferol (VITAMIN D) 1000 UNITS tablet, Take 1,000 Units by mouth daily., Disp: , Rfl:  .  EPIPEN 2-PAK 0.3 MG/0.3ML SOAJ injection, USE FOR ANAPHYLAXIS, Disp: 2 Device, Rfl: 3 .  fluticasone (FLONASE) 50 MCG/ACT nasal spray, Place 1 spray into both nostrils daily., Disp: , Rfl:  .  imipramine (TOFRANIL) 25 MG tablet, Take 50 mg by mouth at bedtime., Disp: , Rfl:  .  levothyroxine (SYNTHROID, LEVOTHROID) 88 MCG tablet, Take 88 mcg by mouth daily before breakfast., Disp: , Rfl:  .  metoprolol succinate (TOPROL-XL) 50 MG 24 hr tablet, Take 50 mg by mouth daily. Take with or immediately following a meal., Disp: , Rfl:  .  montelukast (SINGULAIR) 10 MG tablet, Take 10 mg by mouth at bedtime., Disp: , Rfl:  .  Multiple Vitamin (MULTIVITAMIN) tablet, Take 1 tablet by mouth daily., Disp: , Rfl:  .  azithromycin (ZITHROMAX) 250 MG tablet, Take 1 tablet (250 mg total) by mouth daily. 2 tabs po on day 1, 1 tab po on days 2-5, Disp: 6 tablet, Rfl: 0 .  lovastatin (MEVACOR) 20 MG tablet, Take by mouth., Disp: , Rfl:  .  predniSONE (DELTASONE) 50 MG tablet, Take 1 tablet (50 mg total)  by mouth daily with breakfast., Disp: 5 tablet, Rfl: 0  Allergies  Allergen Reactions  . Other Anaphylaxis    All -floxacin drugs- causes anaphylaxis  . Amoxicillin   . Bextra [Valdecoxib]   . Ciprofloxacin   . Floxin [Ofloxacin]   . Gyne-Lotrimin [Clotrimazole]   . Levaquin [Levofloxacin In D5w]   . Quinolones Other (See Comments)  . Septra [Sulfamethoxazole-Trimethoprim] Other (See Comments)    Flushed and fever.  . Sulfa Antibiotics Other (See Comments)    fever  . Penicillins Rash     ROS  As noted in HPI.   Physical Exam  BP 132/86 (BP Location: Left Arm)   Pulse (!) 124   Temp 98.7 F (37.1 C) (Oral)   Resp 16   Ht 5\' 2"  (1.575 m)   Wt 135 lb (61.2 kg)   LMP 07/01/2014 (Exact Date)   SpO2 99%   BMI 24.69 kg/m   Constitutional: Well developed, well nourished, no acute distress Eyes:  EOMI, conjunctiva normal bilaterally HENT: Normocephalic, atraumatic,mucus membranes moist. Positive purulent nasal congestion, positive maxillary frontal sinus tenderness, normal oropharynx. Positive postnasal drip. Neck: No cervical lymphadenopathy Respiratory: Normal inspiratory effort. Poor air movement, lungs clear bilaterally. Positive chest wall tenderness Cardiovascular: Normal rate regular tachycardia, no murmurs rubs gallops skin: No rash, skin intact Musculoskeletal: Calves symmetric, nontender no deformities Neurologic: Alert & oriented x 3, no focal neuro deficits Psychiatric: Speech and behavior appropriate   ED Course   Medications - No data to display  No orders of the defined types were placed in this encounter.   No results found for this or any previous visit (from the past 24 hour(s)). No results found.  ED Clinical Impression  Acute pansinusitis, recurrence not specified  Exacerbation of asthma, unspecified asthma severity, unspecified whether persistent  ED Assessment/Plan  Patient reports anaphylaxis to Augmentin, doxycycline and  fluoroquinolones. States only antibiotic she can tolerate is azithromycin.  Presentation most consistent with a URI with a resulting sinusitis and asthma exacerbation. Doubt PE. She had some albuterol immediately prior to arrival, also states that she has not taken her metoprolol this morning because she has not eaten.  Pt declined duoneb, prednisone peak flow here. Has a peak flow meter at home. She states that she has plenty of albuterol with a spacer at home and does not need a prescription for this. We'll have her discontinue the Allegra, start some Mucinex, saline nasal irrigation. We'll send home with azithromycin  Z-Pak and prednisone 50 mg for 5 days. She is to take 2 puffs from her albuterol inhaler every 4-6 hours. She will keep a log of her peak flows and will follow-up with her doctor in several days. To the ER if gets worse.  Discussed MDM, plan and followup with patient. Discussed sn/sx that  should prompt return to the ED. Patient agrees with plan.   Meds ordered this encounter  Medications  . predniSONE (DELTASONE) 50 MG tablet    Sig: Take 1 tablet (50 mg total) by mouth daily with breakfast.    Dispense:  5 tablet    Refill:  0  . azithromycin (ZITHROMAX) 250 MG tablet    Sig: Take 1 tablet (250 mg total) by mouth daily. 2 tabs po on day 1, 1 tab po on days 2-5    Dispense:  6 tablet    Refill:  0    *This clinic note was created using [employer]. Therefore, there may be occasional mistakes despite careful proofreading.  ?   Melynda Ripple, MD 07/18/16 [phone]

## 2016-07-28 DIAGNOSIS — D225 Melanocytic nevi of trunk: Secondary | ICD-10-CM | POA: Diagnosis not present

## 2016-07-28 DIAGNOSIS — D2261 Melanocytic nevi of right upper limb, including shoulder: Secondary | ICD-10-CM | POA: Diagnosis not present

## 2016-07-28 DIAGNOSIS — D2272 Melanocytic nevi of left lower limb, including hip: Secondary | ICD-10-CM | POA: Diagnosis not present

## 2016-07-28 DIAGNOSIS — Z85828 Personal history of other malignant neoplasm of skin: Secondary | ICD-10-CM | POA: Diagnosis not present

## 2016-08-18 DIAGNOSIS — H5213 Myopia, bilateral: Secondary | ICD-10-CM | POA: Diagnosis not present

## 2016-09-24 DIAGNOSIS — C44622 Squamous cell carcinoma of skin of right upper limb, including shoulder: Secondary | ICD-10-CM | POA: Diagnosis not present

## 2016-09-24 DIAGNOSIS — D485 Neoplasm of uncertain behavior of skin: Secondary | ICD-10-CM | POA: Diagnosis not present

## 2016-10-21 ENCOUNTER — Ambulatory Visit: Payer: Self-pay | Admitting: Physician Assistant

## 2016-10-21 ENCOUNTER — Encounter: Payer: Self-pay | Admitting: Physician Assistant

## 2016-10-21 VITALS — BP 110/80 | HR 100 | Temp 98.7°F

## 2016-10-21 DIAGNOSIS — R300 Dysuria: Secondary | ICD-10-CM

## 2016-10-21 LAB — POCT URINALYSIS DIPSTICK
BILIRUBIN UA: NEGATIVE
GLUCOSE UA: NEGATIVE
Ketones, UA: NEGATIVE
NITRITE UA: POSITIVE
Spec Grav, UA: 1.02
UROBILINOGEN UA: 0.2
pH, UA: 6

## 2016-10-21 MED ORDER — NITROFURANTOIN MONOHYD MACRO 100 MG PO CAPS: 100.0000 mg | ORAL_CAPSULE | Freq: Two times a day (BID) | ORAL | 0 refills | Status: DC

## 2016-10-21 NOTE — Addendum Note (Signed)
Addended by: Sable Feil on: 10/21/2016 11:53 AM   Modules accepted: Orders

## 2016-10-21 NOTE — Progress Notes (Signed)
   Subjective: UTI    Patient ID: [name], female    DOB: [birth year], 56 y.o.   MRN: [id number]  HPI Patient c/o urinary urgency and frequency for 2 days. History of recurrent UTI. Denies vaginal discharge or flank pain.   Review of Systems   Hypothyroidism and hypertension.  Objective:   Physical Exam Deferred. Dip UA positive for Nitrate and 2+ leu       Assessment & Plan:UTI  Macrobid. Follow up with Urologist.

## 2016-10-27 DIAGNOSIS — E7800 Pure hypercholesterolemia, unspecified: Secondary | ICD-10-CM | POA: Diagnosis not present

## 2016-10-27 DIAGNOSIS — I471 Supraventricular tachycardia: Secondary | ICD-10-CM | POA: Diagnosis not present

## 2016-11-04 NOTE — Progress Notes (Signed)
Referral request to [facility]) sent on 11/04/2016 awaiting appointment

## 2016-11-04 NOTE — Addendum Note (Signed)
Addended by: Vassie Loll D on: 11/04/2016 09:33 AM   Modules accepted: Orders

## 2016-11-05 DIAGNOSIS — E7800 Pure hypercholesterolemia, unspecified: Secondary | ICD-10-CM | POA: Diagnosis not present

## 2016-11-05 DIAGNOSIS — M2540 Effusion, unspecified joint: Secondary | ICD-10-CM | POA: Diagnosis not present

## 2016-11-05 DIAGNOSIS — R7303 Prediabetes: Secondary | ICD-10-CM | POA: Diagnosis not present

## 2016-11-10 DIAGNOSIS — Z0000 Encounter for general adult medical examination without abnormal findings: Secondary | ICD-10-CM | POA: Diagnosis not present

## 2016-11-10 DIAGNOSIS — R7303 Prediabetes: Secondary | ICD-10-CM | POA: Diagnosis not present

## 2016-11-10 DIAGNOSIS — Z8639 Personal history of other endocrine, nutritional and metabolic disease: Secondary | ICD-10-CM | POA: Diagnosis not present

## 2016-11-10 DIAGNOSIS — E7800 Pure hypercholesterolemia, unspecified: Secondary | ICD-10-CM | POA: Diagnosis not present

## 2016-11-23 NOTE — Progress Notes (Signed)
11/24/2016 10:16 AM   [name] 27-Nov-1960 [id number]  Referring provider: Dion Body, MD [place] [facility] [place], [place] [address]  Chief Complaint  Patient presents with  . New Patient (Initial Visit)    Dysuria / recurrent uti referred by  Ashok Cordia PA    HPI: Patient is a 56 -year-old Caucasian female who is referred to [place] by, Dr. Netty Starring, for recurrent urinary tract infections.  Patient states that she has had 2 urinary tract infections over the six months and 3 over the last year.  Her symptoms with a urinary tract infection consist of malodorous urine, nausea, fecal incontinence and stomach ache.  She denies dysuria, gross hematuria, back pain, abdominal pain or flank pain.   She has not had any recent fevers, chills or vomiting.    She does not have a history of nephrolithiasis, GU surgery or GU trauma.  Reviewing her records,  she has had one documented UTI for E. Coli 09/09/2014 .    She had been on suppressive antibiotics in the remote past.    She is sexually active.  She has not noted a correlation with her urinary tract infections and sexual intercourse.  She is post menopausal.   She does not engage in anal sex.   She is voiding before and after sex.   She admits to constipation.   She does engage in good perineal hygiene. She does not take tub baths.   She has SUI incontinence.  She is using incontinence pads.   She is not having pain with bladder filling.  She has not had any recent imaging studies.   She is drinking 3 glasses of water daily.   She loves coffee and diet soda.  She is not taking cranberry tablets or probiotics.  She did take Vitamin C, but it caused bruising.  Her PVR was 27 mL.    PMH: Past Medical History:  Diagnosis Date  . Allergy   . Asthma   . Chicken pox   . Chronic UTI   . Cystocele    2nd degree  . Endometriosis   . Family history of polyps in the colon   . Graves disease   .  History of heavy periods   . Hyperlipemia   . IBS (irritable bowel syndrome)   . Perimenopausal   . Rectocele    mild  . S/P endometrial ablation   . Skin cancer   . SUI (stress urinary incontinence, female)   . SVT (supraventricular tachycardia) ([place])   . Urine incontinence     Surgical History: Past Surgical History:  Procedure Laterality Date  . endometrrial ablation    . EYE SURGERY    . knee athroscopy Left 2013  . laprascopic surgery     endometreosis  . THYROIDECTOMY, PARTIAL    . TONSILLECTOMY AND ADENOIDECTOMY    . tubes in ears      Home Medications:  Allergies as of 11/24/2016      Reactions   Other Anaphylaxis   All -floxacin drugs- causes anaphylaxis   Amoxicillin    Bextra [valdecoxib]    Ciprofloxacin    Floxin [ofloxacin]    Gyne-lotrimin [clotrimazole]    Levaquin [levofloxacin In D5w]    Quinolones Other (See Comments)   Septra [sulfamethoxazole-trimethoprim] Other (See Comments)   Flushed and fever.   Sulfa Antibiotics Other (See Comments)   fever   Penicillins Rash      Medication List  Accurate as of 11/24/16 10:16 AM. Always use your most recent med list.          albuterol 108 (90 Base) MCG/ACT inhaler Commonly known as:  PROVENTIL HFA;VENTOLIN HFA Inhale 2 puffs into the lungs every 6 (six) hours as needed for wheezing or shortness of breath.   aspirin 81 MG tablet Take 81 mg by mouth daily.   azithromycin 250 MG tablet Commonly known as:  ZITHROMAX Take 1 tablet (250 mg total) by mouth daily. 2 tabs po on day 1, 1 tab po on days 2-5   cholecalciferol 1000 units tablet Commonly known as:  VITAMIN D Take 1,000 Units by mouth daily.   EPIPEN 2-PAK 0.3 mg/0.3 mL Soaj injection Generic drug:  EPINEPHrine USE FOR ANAPHYLAXIS   fluticasone 50 MCG/ACT nasal spray Commonly known as:  FLONASE Place 1 spray into both nostrils daily.   imipramine 25 MG tablet Commonly known as:  TOFRANIL Take 50 mg by mouth at bedtime.     levothyroxine 88 MCG tablet Commonly known as:  SYNTHROID, LEVOTHROID Take 88 mcg by mouth daily before breakfast.   lovastatin 20 MG tablet Commonly known as:  MEVACOR Take by mouth.   lovastatin 20 MG tablet Commonly known as:  MEVACOR Take by mouth.   metoprolol succinate 50 MG 24 hr tablet Commonly known as:  TOPROL-XL Take 50 mg by mouth daily. Take with or immediately following a meal.   montelukast 10 MG tablet Commonly known as:  SINGULAIR Take 10 mg by mouth at bedtime.   multivitamin tablet Take 1 tablet by mouth daily.   nitrofurantoin (macrocrystal-monohydrate) 100 MG capsule Commonly known as:  MACROBID Take 1 capsule (100 mg total) by mouth 2 (two) times daily.   nitrofurantoin (macrocrystal-monohydrate) 100 MG capsule Commonly known as:  MACROBID Take 1 capsule (100 mg total) by mouth 2 (two) times daily.   predniSONE 50 MG tablet Commonly known as:  DELTASONE Take 1 tablet (50 mg total) by mouth daily with breakfast.       Allergies:  Allergies  Allergen Reactions  . Other Anaphylaxis    All -floxacin drugs- causes anaphylaxis  . Amoxicillin   . Bextra [Valdecoxib]   . Ciprofloxacin   . Floxin [Ofloxacin]   . Gyne-Lotrimin [Clotrimazole]   . Levaquin [Levofloxacin In D5w]   . Quinolones Other (See Comments)  . Septra [Sulfamethoxazole-Trimethoprim] Other (See Comments)    Flushed and fever.  . Sulfa Antibiotics Other (See Comments)    fever  . Penicillins Rash    Family History: Family History  Problem Relation Age of Onset  . Cancer Mother   . Hyperlipidemia Mother   . Heart disease Mother   . Hypertension Mother   . Breast cancer Mother 41  . Kidney failure Mother   . Cancer Father   . Hyperlipidemia Father   . Heart disease Father   . Hypertension Father   . Breast cancer Paternal Aunt 69  . Breast cancer Maternal Aunt 85  . Kidney cancer Neg Hx   . Bladder Cancer Neg Hx   . Prostate cancer Neg Hx     Social History:   reports that she has never smoked. She has never used smokeless tobacco. She reports that she does not drink alcohol or use drugs.  ROS: UROLOGY Frequent Urination?: No Hard to postpone urination?: No Burning/pain with urination?: No Get up at night to urinate?: No Leakage of urine?: Yes Urine stream starts and stops?: No Trouble starting stream?: No Do  you have to strain to urinate?: No Blood in urine?: No Urinary tract infection?: Yes Sexually transmitted disease?: No Injury to kidneys or bladder?: No Painful intercourse?: No Weak stream?: No Currently pregnant?: No Vaginal bleeding?: No Last menstrual period?: n  Gastrointestinal Nausea?: No Vomiting?: No Indigestion/heartburn?: No Diarrhea?: No Constipation?: Yes  Constitutional Fever: No Night sweats?: No Weight loss?: No Fatigue?: No  Skin Skin rash/lesions?: No Itching?: No  Eyes Blurred vision?: No Double vision?: No  Ears/Nose/Throat Sore throat?: No Sinus problems?: No  Hematologic/Lymphatic Swollen glands?: No Easy bruising?: No  Cardiovascular Leg swelling?: No Chest pain?: No  Respiratory Cough?: No Shortness of breath?: No  Endocrine Excessive thirst?: No  Musculoskeletal Back pain?: No Joint pain?: No  Neurological Headaches?: No Dizziness?: No  Psychologic Depression?: No Anxiety?: No  Physical Exam: BP 111/76   Pulse 88   Ht '5\' 2"'  (1.575 m)   Wt 128 lb 14.4 oz (58.5 kg)   LMP 07/01/2014 (Exact Date)   BMI 23.58 kg/m   Constitutional: Well nourished. Alert and oriented, No acute distress. HEENT: [place] AT, moist mucus membranes. Trachea midline, no masses. Cardiovascular: No clubbing, cyanosis, or edema. Respiratory: Normal respiratory effort, no increased work of breathing. GI: Abdomen is soft, non tender, non distended, no abdominal masses. Liver and spleen not palpable.  No hernias appreciated.  Stool sample for occult testing is not indicated.   GU: No CVA  tenderness.  No bladder fullness or masses.  Atrophic external genitalia, normal pubic hair distribution, no lesions.  Normal urethral meatus, no lesions, no prolapse, no discharge.   No urethral masses, tenderness and/or tenderness. No bladder fullness, tenderness or masses. Pale vagina mucosa, poor estrogen effect, no discharge, no lesions, good pelvic support, no cystocele or rectocele noted.  No cervical motion tenderness.  Uterus is freely mobile and non-fixed.  No adnexal/parametria masses or tenderness noted.  Anus and perineum are without rashes or lesions.    Skin: No rashes, bruises or suspicious lesions. Lymph: No cervical or inguinal adenopathy. Neurologic: Grossly intact, no focal deficits, moving all 4 extremities. Psychiatric: Normal mood and affect.  Laboratory Data: Lab Results  Component Value Date   WBC 6.3 06/09/2012   HGB 13.9 06/09/2012   HCT 40.5 06/09/2012   MCV 85 06/09/2012   PLT 241 06/09/2012    Lab Results  Component Value Date   CREATININE 0.61 06/09/2012    Lab Results  Component Value Date   TSH 0.863 06/09/2012       Component Value Date/Time   CHOL 187 06/09/2012 0818   HDL 49 06/09/2012 0818   VLDL 11 06/09/2012 0818   LDLCALC 127 (H) 06/09/2012 0818    Lab Results  Component Value Date   AST 13 (L) 06/09/2012   Lab Results  Component Value Date   ALT 26 06/09/2012    Pertinent Imaging: Results for JESILYN, EASOM (MRN [id number]) as of 11/24/2016 10:04  Ref. Range 11/24/2016 09:49  Scan Result Unknown 27    Assessment & Plan:    1. Malodorous urine  - criteria for recurrent UTI has been met with 2 or more infections in 6 months or 3 or greater infections in one year per patient's report, but there are no documented UTI's  - Patient is instructed to increase their water intake until the urine is pale yellow or clear (10 to 12 cups daily)   - encouraged to take probiotics (yogurt, oral pills or vaginal suppositories), take  cranberry pills or drink  the juice  - Vitamin C 1,000 mg daily caused bruising  - avoid soaking in tubs and wipe front to back after urinating   - benefit from core strengthening exercises has been seen - she has had pelvic floor PT in the past  - advised them to have CATH UA's for urinalysis and culture to prevent skin contamination of the specimen  - reviewed symptoms of UTI and advised not to have urine checked or be treated for UTI if not experiencing symptoms  - discussed antibiotic stewardship with the patient    - RTC in 3 months for OAB questionnaire and PVR  2. Vaginal atrophy  - I explained to the patient that when women go through menopause and her estrogen levels are severely diminished, the normal vaginal flora will change.  This is due to an increase of the vaginal canal's pH. Because of this, the vaginal canal may be colonized by bacteria from the rectum instead of the protective lactobacillus.  This accompanied by the loss of the mucus barrier with vaginal atrophy is a cause of recurrent urinary tract infections.  - In some studies, the use of vaginal estrogen cream has been demonstrated to reduce  recurrent urinary tract infections to one a year.   - Patient does not want to use hormone creams as her mother has been diagnosed with breast cancer  3. SUI  - not bothersome to patient at this time  - managed with imipramine by PCP  4. Cystocele  - see above                                   Return in about 3 months (around 02/23/2017) for PVR and OAB questionnaire.  These notes generated with voice recognition software. I apologize for typographical errors.  Zara Council, [facility] [address], [place], [place] [address] [phone]

## 2016-11-24 ENCOUNTER — Encounter: Payer: Self-pay | Admitting: Urology

## 2016-11-24 ENCOUNTER — Ambulatory Visit: Payer: 59 | Admitting: Urology

## 2016-11-24 VITALS — BP 111/76 | HR 88 | Ht 62.0 in | Wt 128.9 lb

## 2016-11-24 DIAGNOSIS — R8290 Unspecified abnormal findings in urine: Secondary | ICD-10-CM | POA: Diagnosis not present

## 2016-11-24 DIAGNOSIS — N393 Stress incontinence (female) (male): Secondary | ICD-10-CM | POA: Diagnosis not present

## 2016-11-24 DIAGNOSIS — N390 Urinary tract infection, site not specified: Secondary | ICD-10-CM | POA: Diagnosis not present

## 2016-11-24 DIAGNOSIS — N8111 Cystocele, midline: Secondary | ICD-10-CM

## 2016-11-24 DIAGNOSIS — N952 Postmenopausal atrophic vaginitis: Secondary | ICD-10-CM | POA: Diagnosis not present

## 2016-11-24 LAB — BLADDER SCAN AMB NON-IMAGING: SCAN RESULT: 27

## 2016-11-29 ENCOUNTER — Other Ambulatory Visit: Payer: Self-pay | Admitting: Physician Assistant

## 2016-11-29 MED ORDER — TOBRAMYCIN-DEXAMETHASONE 0.3-0.1 % OP SUSP: 2.0000 [drp] | OPHTHALMIC | 0 refills | Status: DC

## 2016-11-29 NOTE — Progress Notes (Signed)
S: pt c/o both eyes being red for several weeks, used otc visine allergy without relief, no matting or crusting, then used patanol op drops without relief, no cold sx , no fever/chills  O: injected conjunctiva b/l, no drainage or matted, perrl eomi  A: acute conjunctivitis  P: tobradex opth gtts, f/u with opth. If not better in few days

## 2016-12-24 ENCOUNTER — Ambulatory Visit (INDEPENDENT_AMBULATORY_CARE_PROVIDER_SITE_OTHER): Payer: 59

## 2016-12-24 VITALS — BP 118/80 | HR 91 | Ht 62.0 in | Wt 131.9 lb

## 2016-12-24 DIAGNOSIS — N390 Urinary tract infection, site not specified: Secondary | ICD-10-CM | POA: Diagnosis not present

## 2016-12-24 LAB — URINALYSIS, COMPLETE
Bilirubin, UA: NEGATIVE
Glucose, UA: NEGATIVE
Ketones, UA: NEGATIVE
Nitrite, UA: POSITIVE — AB
PH UA: 7 (ref 5.0–7.5)
PROTEIN UA: NEGATIVE
RBC, UA: NEGATIVE
Specific Gravity, UA: 1.01 (ref 1.005–1.030)
UUROB: 0.2 mg/dL (ref 0.2–1.0)

## 2016-12-24 LAB — MICROSCOPIC EXAMINATION: RBC MICROSCOPIC, UA: NONE SEEN /HPF (ref 0–?)

## 2016-12-24 NOTE — Progress Notes (Signed)
Pt presents today with c/o dysuria, leakage of urine, urinary urgency, and foul smelling urine. A CATH specimen was obtained for u/a and cx.   Blood pressure 118/80, pulse 91, height 5\' 2"  (1.575 m), weight 131 lb 14.4 oz (59.8 kg), last menstrual period 07/01/2014.

## 2016-12-27 ENCOUNTER — Telehealth: Payer: Self-pay

## 2016-12-27 DIAGNOSIS — H1045 Other chronic allergic conjunctivitis: Secondary | ICD-10-CM | POA: Diagnosis not present

## 2016-12-27 DIAGNOSIS — N390 Urinary tract infection, site not specified: Secondary | ICD-10-CM

## 2016-12-27 LAB — CULTURE, URINE COMPREHENSIVE

## 2016-12-27 MED ORDER — NITROFURANTOIN MONOHYD MACRO 100 MG PO CAPS: 100.0000 mg | ORAL_CAPSULE | Freq: Two times a day (BID) | ORAL | 0 refills | Status: AC

## 2016-12-27 NOTE — Telephone Encounter (Signed)
-----   Message from Nori Riis, PA-C sent at 12/27/2016  9:56 AM EDT ----- Patient has a +UCx.  They need to start Macrobid, one capsule twice daily for seven days.  They also need to take a probiotic with the antibiotic course.

## 2016-12-27 NOTE — Telephone Encounter (Signed)
Spoke with pt in reference to +ucx. Made aware abx were sent into pharmacy. Pt voiced understanding.

## 2017-01-07 DIAGNOSIS — E7800 Pure hypercholesterolemia, unspecified: Secondary | ICD-10-CM | POA: Diagnosis not present

## 2017-01-07 DIAGNOSIS — Z8639 Personal history of other endocrine, nutritional and metabolic disease: Secondary | ICD-10-CM | POA: Diagnosis not present

## 2017-01-10 DIAGNOSIS — H1045 Other chronic allergic conjunctivitis: Secondary | ICD-10-CM | POA: Diagnosis not present

## 2017-01-19 ENCOUNTER — Telehealth: Payer: Self-pay

## 2017-01-19 NOTE — Telephone Encounter (Signed)
Pt called and left a message that she thinks she has another UTI. Pt was seen as a nurse visit on 5/5 for a cath specimen for UTI. Macrobid was given. Pt stated after completing abx she felt better until now. Pt inquired about getting more abx, possible imaging, or just a cath specimen. Please advise.

## 2017-01-19 NOTE — Telephone Encounter (Signed)
I would like the patient to have another CATH UA and culture at this time.  I need to know if an infection is still present and if we are going to order imaging, we need to specify what type of imaging.  The UA will help determine this.

## 2017-01-20 ENCOUNTER — Ambulatory Visit (INDEPENDENT_AMBULATORY_CARE_PROVIDER_SITE_OTHER): Payer: 59 | Admitting: Family Medicine

## 2017-01-20 VITALS — BP 114/78 | HR 86 | Ht 62.0 in | Wt 130.4 lb

## 2017-01-20 DIAGNOSIS — N390 Urinary tract infection, site not specified: Secondary | ICD-10-CM

## 2017-01-20 LAB — URINALYSIS, COMPLETE
Bilirubin, UA: NEGATIVE
Glucose, UA: NEGATIVE
Ketones, UA: NEGATIVE
NITRITE UA: NEGATIVE
Protein, UA: NEGATIVE
RBC, UA: NEGATIVE
Specific Gravity, UA: 1.015 (ref 1.005–1.030)
Urobilinogen, Ur: 0.2 mg/dL (ref 0.2–1.0)
pH, UA: 7 (ref 5.0–7.5)

## 2017-01-20 LAB — MICROSCOPIC EXAMINATION
EPITHELIAL CELLS (NON RENAL): NONE SEEN /HPF (ref 0–10)
RBC MICROSCOPIC, UA: NONE SEEN /HPF (ref 0–?)
WBC, UA: NONE SEEN /hpf (ref 0–?)

## 2017-01-20 NOTE — Progress Notes (Signed)
In and Out Catheterization  Patient is present today for a I & O catheterization due to recurrent UTI. Patient was cleaned and prepped in a sterile fashion with betadine and Lidocaine 2% jelly was instilled into the urethra.  A 16FR cath was inserted no complications were noted , 178ml of urine return was noted, urine was yellow in color. A clean urine sample was collected for urine culture. Bladder was drained  And catheter was removed with out difficulty.     Preformed by: C. Corinna Capra, CMA

## 2017-01-20 NOTE — Telephone Encounter (Signed)
LMOM- call back to be added to nurse schedule for cath specimen.

## 2017-01-22 LAB — URINE CULTURE

## 2017-01-23 ENCOUNTER — Encounter: Payer: Self-pay | Admitting: Urology

## 2017-01-23 ENCOUNTER — Other Ambulatory Visit: Payer: Self-pay | Admitting: Urology

## 2017-01-23 DIAGNOSIS — N390 Urinary tract infection, site not specified: Secondary | ICD-10-CM

## 2017-01-23 MED ORDER — NITROFURANTOIN MONOHYD MACRO 100 MG PO CAPS: 100.0000 mg | ORAL_CAPSULE | Freq: Two times a day (BID) | ORAL | 0 refills | Status: DC

## 2017-01-24 MED ORDER — NITROFURANTOIN MONOHYD MACRO 100 MG PO CAPS: 100.0000 mg | ORAL_CAPSULE | Freq: Two times a day (BID) | ORAL | 0 refills | Status: DC

## 2017-01-24 NOTE — Telephone Encounter (Signed)
-----   Message from Nori Riis, PA-C sent at 01/23/2017  6:43 PM EDT ----- Patient has a +UCx.  They need to start Macrobid one capsule twice daily for seven days.  They also need to take a probiotic with the antibiotic course.  I have e scribed their prescription to [facility] in [place].  Also, we need to schedule a RUS for recurrent UTI's.

## 2017-01-24 NOTE — Telephone Encounter (Signed)
This encounter was created in error - please disregard.

## 2017-02-28 ENCOUNTER — Ambulatory Visit
Admission: RE | Admit: 2017-02-28 | Discharge: 2017-02-28 | Disposition: A | Discharge status: Home / Self Care | Payer: 59 | Source: Ambulatory Visit | Attending: Urology | Admitting: Urology

## 2017-02-28 DIAGNOSIS — Z87440 Personal history of urinary (tract) infections: Secondary | ICD-10-CM | POA: Insufficient documentation

## 2017-02-28 DIAGNOSIS — N390 Urinary tract infection, site not specified: Secondary | ICD-10-CM

## 2017-03-08 NOTE — Progress Notes (Signed)
03/09/2017 2:52 PM   [place] 04-May-1961 [id number]  Referring provider: Dion Body, MD [facility] [place], [place] [address]  Chief Complaint  Patient presents with  . Results    RUS    HPI: 56 yo WF who presents today to discuss her RUS report.  Background history Patient is a 45 -year-old Caucasian female who is referred to [place] by, Dr. Netty Starring, for recurrent urinary tract infections.  Patient states that she has had 2 urinary tract infections over the six months and 3 over the last year.  Her symptoms with a urinary tract infection consist of malodorous urine, nausea, fecal incontinence and stomach ache.  She denies dysuria, gross hematuria, back pain, abdominal pain or flank pain.   She has not had any recent fevers, chills or vomiting.  She does not have a history of nephrolithiasis, GU surgery or GU trauma.  Reviewing her records,  she has had one documented UTI for E. Coli 09/09/2014 .  She had been on suppressive antibiotics in the remote past.  She is sexually active.  She has not noted a correlation with her urinary tract infections and sexual intercourse.  She is post menopausal.   She does not engage in anal sex.   She is voiding before and after sex.  She admits to constipation.   She does engage in good perineal hygiene. She does not take tub baths.  She has SUI incontinence.  She is using incontinence pads.   She is not having pain with bladder filling.  She has not had any recent imaging studies.  She is drinking 3 glasses of water daily.   She loves coffee and diet soda.  She is not taking cranberry tablets or probiotics.  She did take Vitamin C, but it caused bruising.  Her PVR was 27 mL.    I've seen her initially in April 2018. One month later, she contact the office complaining of symptoms of urinary tract infection. Urine culture was positive for E. Coli.  She was treated with a 7 day course of Macrobid.  3 weeks later  her symptoms returned and her catheterized urine was positive for Escherichia coli again. She was given another 7 day course of Macrobid.  RUS performed on 02/28/2017 was normal.    Today, she is having baseline urinary symptoms of SUI.  She denies any gross hematuria, dysuria and suprapubic.  She fevers, chills, nausea or vomiting.  She is taking cranberry tablets and probiotics.  She is sexually active and this may have precipitated her infections.    PMH: Past Medical History:  Diagnosis Date  . Allergy   . Asthma   . Chicken pox   . Chronic UTI   . Cystocele    2nd degree  . Endometriosis   . Family history of polyps in the colon   . Graves disease   . History of heavy periods   . Hyperlipemia   . IBS (irritable bowel syndrome)   . Perimenopausal   . Rectocele    mild  . S/P endometrial ablation   . Skin cancer   . SUI (stress urinary incontinence, female)   . SVT (supraventricular tachycardia) ([place])   . Urine incontinence     Surgical History: Past Surgical History:  Procedure Laterality Date  . endometrrial ablation    . EYE SURGERY    . knee athroscopy Left 2013  . laprascopic surgery     endometreosis  .  THYROIDECTOMY, PARTIAL    . TONSILLECTOMY AND ADENOIDECTOMY    . tubes in ears      Home Medications:  Allergies as of 03/09/2017      Reactions   Other Anaphylaxis   All -floxacin drugs- causes anaphylaxis   Amoxicillin    Bextra [valdecoxib]    Ciprofloxacin    Floxin [ofloxacin]    Gyne-lotrimin [clotrimazole]    Levaquin [levofloxacin In D5w]    Levofloxacin    Quinolones Other (See Comments)   Septra [sulfamethoxazole-trimethoprim] Other (See Comments)   Flushed and fever.   Sulfa Antibiotics Other (See Comments)   fever   Penicillins Rash      Medication List       Accurate as of 03/09/17  2:52 PM. Always use your most recent med list.          albuterol 108 (90 Base) MCG/ACT inhaler Commonly known as:  PROVENTIL HFA;VENTOLIN  HFA Inhale 2 puffs into the lungs every 6 (six) hours as needed for wheezing or shortness of breath.   ALLEGRA ALLERGY 180 MG tablet Generic drug:  fexofenadine Allegra Allergy 180 mg tablet  Take 1 tablet every day by oral route.   aspirin 81 MG tablet Take 81 mg by mouth daily.   azithromycin 250 MG tablet Commonly known as:  ZITHROMAX Take 1 tablet (250 mg total) by mouth daily. 2 tabs po on day 1, 1 tab po on days 2-5   cephALEXin 250 MG capsule Commonly known as:  KEFLEX Take one tablet after intercourse   cholecalciferol 1000 units tablet Commonly known as:  VITAMIN D Take 1,000 Units by mouth daily.   CRANBERRY PO Take by mouth.   EPIPEN 2-PAK 0.3 mg/0.3 mL Soaj injection Generic drug:  EPINEPHrine USE FOR ANAPHYLAXIS   fluticasone 50 MCG/ACT nasal spray Commonly known as:  FLONASE Place 1 spray into both nostrils daily.   imipramine 25 MG tablet Commonly known as:  TOFRANIL Take 50 mg by mouth at bedtime.   ketotifen 0.025 % ophthalmic solution Commonly known as:  ZADITOR Place 1 drop into both eyes 2 (two) times daily.   levothyroxine 88 MCG tablet Commonly known as:  SYNTHROID, LEVOTHROID Take 100 mcg by mouth daily before breakfast.   lovastatin 20 MG tablet Commonly known as:  MEVACOR Take by mouth.   lovastatin 20 MG tablet Commonly known as:  MEVACOR Take by mouth.   metoprolol succinate 50 MG 24 hr tablet Commonly known as:  TOPROL-XL Take 50 mg by mouth daily. Take with or immediately following a meal.   METROGEL 1 % gel Generic drug:  metroNIDAZOLE Metrogel 1 % topical   1 application every day by topical route.   montelukast 10 MG tablet Commonly known as:  SINGULAIR Take 10 mg by mouth at bedtime.   multivitamin tablet Take 1 tablet by mouth daily.   nitrofurantoin (macrocrystal-monohydrate) 100 MG capsule Commonly known as:  MACROBID Take 1 capsule (100 mg total) by mouth every 12 (twelve) hours.   predniSONE 50 MG  tablet Commonly known as:  DELTASONE Take 1 tablet (50 mg total) by mouth daily with breakfast.   PROBIOTIC PO Take by mouth.   tobramycin-dexamethasone ophthalmic solution Commonly known as:  TOBRADEX Place 2 drops into both eyes every 4 (four) hours while awake.   traMADol 50 MG tablet Commonly known as:  ULTRAM tramadol 50 mg tablet  Take 1 tablet every 4 hours by oral route as needed for pain.  Allergies:  Allergies  Allergen Reactions  . Other Anaphylaxis    All -floxacin drugs- causes anaphylaxis  . Amoxicillin   . Bextra [Valdecoxib]   . Ciprofloxacin   . Floxin [Ofloxacin]   . Gyne-Lotrimin [Clotrimazole]   . Levaquin [Levofloxacin In D5w]   . Levofloxacin   . Quinolones Other (See Comments)  . Septra [Sulfamethoxazole-Trimethoprim] Other (See Comments)    Flushed and fever.  . Sulfa Antibiotics Other (See Comments)    fever  . Penicillins Rash    Family History: Family History  Problem Relation Age of Onset  . Cancer Mother   . Hyperlipidemia Mother   . Heart disease Mother   . Hypertension Mother   . Breast cancer Mother 43  . Kidney failure Mother   . Cancer Father   . Hyperlipidemia Father   . Heart disease Father   . Hypertension Father   . Breast cancer Paternal Aunt 103  . Breast cancer Maternal Aunt 85  . Kidney cancer Neg Hx   . Bladder Cancer Neg Hx   . Prostate cancer Neg Hx     Social History:  reports that she has never smoked. She has never used smokeless tobacco. She reports that she does not drink alcohol or use drugs.  ROS: UROLOGY Frequent Urination?: No Hard to postpone urination?: No Burning/pain with urination?: No Get up at night to urinate?: No Leakage of urine?: Yes Urine stream starts and stops?: No Trouble starting stream?: No Do you have to strain to urinate?: No Blood in urine?: No Urinary tract infection?: No Sexually transmitted disease?: No Injury to kidneys or bladder?: No Painful intercourse?:  No Weak stream?: No Currently pregnant?: No Vaginal bleeding?: No Last menstrual period?: n  Gastrointestinal Nausea?: No Vomiting?: No Indigestion/heartburn?: No Diarrhea?: No Constipation?: Yes  Constitutional Fever: No Night sweats?: No Weight loss?: No Fatigue?: No  Skin Skin rash/lesions?: No Itching?: No  Eyes Blurred vision?: No Double vision?: No  Ears/Nose/Throat Sore throat?: No Sinus problems?: No  Hematologic/Lymphatic Swollen glands?: No Easy bruising?: No  Cardiovascular Leg swelling?: No Chest pain?: No  Respiratory Cough?: No Shortness of breath?: No  Endocrine Excessive thirst?: No  Musculoskeletal Back pain?: No Joint pain?: No  Neurological Headaches?: No Dizziness?: No  Psychologic Depression?: No Anxiety?: No  Physical Exam: BP 119/79   Pulse 76   Ht '5\' 2"'  (1.575 m)   Wt 129 lb 9.6 oz (58.8 kg)   LMP 07/01/2014 (Exact Date)   BMI 23.70 kg/m   Constitutional: Well nourished. Alert and oriented, No acute distress. HEENT: [place] AT, moist mucus membranes. Trachea midline, no masses. Cardiovascular: No clubbing, cyanosis, or edema. Respiratory: Normal respiratory effort, no increased work of breathing. Skin: No rashes, bruises or suspicious lesions. Lymph: No cervical or inguinal adenopathy. Neurologic: Grossly intact, no focal deficits, moving all 4 extremities. Psychiatric: Normal mood and affect.  Pertinent Imaging: CLINICAL DATA:  56 year old female with chronic urinary tract infections. Initial encounter.  EXAM: RENAL / URINARY TRACT ULTRASOUND COMPLETE  COMPARISON:  None.  FINDINGS: Right Kidney:  Length: 10.3 cm. Echogenicity within normal limits. No mass or hydronephrosis visualized.  Left Kidney:  Length: 10.2 cm. Echogenicity within normal limits. No mass or hydronephrosis visualized.  Bladder:  Appears normal for degree of bladder distention.  IMPRESSION: Negative  exam.   Electronically Signed   By: Genia Del M.D.   On: 02/28/2017 18:56  I have independently reviewed the films  Assessment & Plan:    1. Recurrent UTI's  -  RUS found known diabetes for UTI's  - criteria for recurrent UTI has been met with 2 or more infections in 6 months or 3 or greater infections in one year per patient's report, but there are no documented UTI's  - Patient is instructed to increase their water intake until the urine is pale yellow or clear (10 to 12 cups daily)   - encouraged to take probiotics (yogurt, oral pills or vaginal suppositories), take cranberry pills or drink the juice  - Vitamin C 1,000 mg daily caused bruising  - avoid soaking in tubs and wipe front to back after urinating   - benefit from core strengthening exercises has been seen - she has had pelvic floor PT in the past  - advised them to have CATH UA's for urinalysis and culture to prevent skin contamination of the specimen  - reviewed symptoms of UTI and advised not to have urine checked or be treated for UTI if not experiencing symptoms  - discussed antibiotic stewardship with the patient  - start Keflex 250 mg for sexual prophylaxis   - RTC in 3 months for OAB questionnaire and PVR  2. Vaginal atrophy  - I explained to the patient that when women go through menopause and her estrogen levels are severely diminished, the normal vaginal flora will change.  This is due to an increase of the vaginal canal's pH. Because of this, the vaginal canal may be colonized by bacteria from the rectum instead of the protective lactobacillus.  This accompanied by the loss of the mucus barrier with vaginal atrophy is a cause of recurrent urinary tract infections.  - In some studies, the use of vaginal estrogen cream has been demonstrated to reduce  recurrent urinary tract infections to one a year.   - Patient does not want to use hormone creams as her mother has been diagnosed with breast cancer  3. SUI  -  not bothersome to patient at this time  - managed with imipramine by PCP  4. Cystocele  - see above                                   Return in about 3 months (around 06/09/2017) for recheck symptoms.  These notes generated with voice recognition software. I apologize for typographical errors.  Zara Council, [facility] [address], [place], [place] [address] [phone]

## 2017-03-09 ENCOUNTER — Encounter: Payer: Self-pay | Admitting: Urology

## 2017-03-09 ENCOUNTER — Ambulatory Visit (INDEPENDENT_AMBULATORY_CARE_PROVIDER_SITE_OTHER): Payer: 59 | Admitting: Urology

## 2017-03-09 VITALS — BP 119/79 | HR 76 | Ht 62.0 in | Wt 129.6 lb

## 2017-03-09 DIAGNOSIS — N952 Postmenopausal atrophic vaginitis: Secondary | ICD-10-CM

## 2017-03-09 DIAGNOSIS — N393 Stress incontinence (female) (male): Secondary | ICD-10-CM

## 2017-03-09 DIAGNOSIS — N390 Urinary tract infection, site not specified: Secondary | ICD-10-CM

## 2017-03-09 DIAGNOSIS — N8111 Cystocele, midline: Secondary | ICD-10-CM | POA: Diagnosis not present

## 2017-03-09 MED ORDER — CEPHALEXIN 250 MG PO CAPS: ORAL_CAPSULE | ORAL | 0 refills | Status: DC

## 2017-04-11 NOTE — Progress Notes (Signed)
ANNUAL PREVENTATIVE CARE GYN  ENCOUNTER NOTE  Subjective:       [name] is a 56 y.o. G48P3003 female here for a routine annual gynecologic exam.  Current complaints: 1.   None  Patient has intermittent vasomotor symptoms.  She is  In hormone replacement therapy due to family history of Breast cancer. New diagnoses include recurrent UTIs; she is being followed by [facility] urology.  They have offered vaginal estrogen cream at this time, but she has declined this therapy   Gynecologic History Patient's last menstrual period was 07/01/2014 (exact date). Contraception: post menopausal status November 2015 Last Pap: 01/2014 neg/neg. Results were: normal Last mammogram: 03/2016 birad 1. Results were: normal  Obstetric History OB History  Gravida Para Term Preterm AB Living  3 3 3     3   SAB TAB Ectopic Multiple Live Births          3    # Outcome Date GA Lbr Len/2nd Weight Sex Delivery Anes PTL Lv  3 Term 08/08/96    F CS-Unspec   LIV  2 Term 08/23/95    M CS-Unspec   LIV  1 Term 10/25/93    M CS-Unspec   LIV      Past Medical History:  Diagnosis Date  . Allergy   . Asthma   . Chicken pox   . Chronic UTI   . Cystocele    2nd degree  . Endometriosis   . Family history of polyps in the colon   . Graves disease   . History of heavy periods   . Hyperlipemia   . IBS (irritable bowel syndrome)   . Perimenopausal   . Rectocele    mild  . S/P endometrial ablation   . Skin cancer   . SUI (stress urinary incontinence, female)   . SVT (supraventricular tachycardia) ([place])   . Urine incontinence     Past Surgical History:  Procedure Laterality Date  . endometrrial ablation    . EYE SURGERY    . knee athroscopy Left 2013  . laprascopic surgery     endometreosis  . THYROIDECTOMY, PARTIAL    . TONSILLECTOMY AND ADENOIDECTOMY    . tubes in ears      Current Outpatient Prescriptions on File Prior to Visit  Medication Sig Dispense Refill  . albuterol (PROVENTIL  HFA;VENTOLIN HFA) 108 (90 BASE) MCG/ACT inhaler Inhale 2 puffs into the lungs every 6 (six) hours as needed for wheezing or shortness of breath.    Marland Kitchen aspirin 81 MG tablet Take 81 mg by mouth daily.    Marland Kitchen azithromycin (ZITHROMAX) 250 MG tablet Take 1 tablet (250 mg total) by mouth daily. 2 tabs po on day 1, 1 tab po on days 2-5 (Patient not taking: Reported on 10/21/2016) 6 tablet 0  . cephALEXin (KEFLEX) 250 MG capsule Take one tablet after intercourse 90 capsule 0  . cholecalciferol (VITAMIN D) 1000 UNITS tablet Take 1,000 Units by mouth daily.    Marland Kitchen CRANBERRY PO Take by mouth.    . EPIPEN 2-PAK 0.3 MG/0.3ML SOAJ injection USE FOR ANAPHYLAXIS 2 Device 3  . fexofenadine (ALLEGRA ALLERGY) 180 MG tablet Allegra Allergy 180 mg tablet  Take 1 tablet every day by oral route.    . fluticasone (FLONASE) 50 MCG/ACT nasal spray Place 1 spray into both nostrils daily.    Marland Kitchen imipramine (TOFRANIL) 25 MG tablet Take 50 mg by mouth at bedtime.    Marland Kitchen ketotifen (ZADITOR) 0.025 % ophthalmic solution  Place 1 drop into both eyes 2 (two) times daily.    Marland Kitchen levothyroxine (SYNTHROID, LEVOTHROID) 88 MCG tablet Take 100 mcg by mouth daily before breakfast.     . lovastatin (MEVACOR) 20 MG tablet Take by mouth.    . lovastatin (MEVACOR) 20 MG tablet Take by mouth.    . metoprolol succinate (TOPROL-XL) 50 MG 24 hr tablet Take 50 mg by mouth daily. Take with or immediately following a meal.    . metroNIDAZOLE (METROGEL) 1 % gel Metrogel 1 % topical   1 application every day by topical route.    . montelukast (SINGULAIR) 10 MG tablet Take 10 mg by mouth at bedtime.    . Multiple Vitamin (MULTIVITAMIN) tablet Take 1 tablet by mouth daily.    . nitrofurantoin, macrocrystal-monohydrate, (MACROBID) 100 MG capsule Take 1 capsule (100 mg total) by mouth every 12 (twelve) hours. (Patient not taking: Reported on 03/09/2017) 14 capsule 0  . predniSONE (DELTASONE) 50 MG tablet Take 1 tablet (50 mg total) by mouth daily with breakfast.  (Patient not taking: Reported on 10/21/2016) 5 tablet 0  . Probiotic Product (PROBIOTIC PO) Take by mouth.    . tobramycin-dexamethasone (TOBRADEX) ophthalmic solution Place 2 drops into both eyes every 4 (four) hours while awake. (Patient not taking: Reported on 03/09/2017) 5 mL 0  . traMADol (ULTRAM) 50 MG tablet tramadol 50 mg tablet  Take 1 tablet every 4 hours by oral route as needed for pain.     No current facility-administered medications on file prior to visit.     Allergies  Allergen Reactions  . Other Anaphylaxis    All -floxacin drugs- causes anaphylaxis  . Amoxicillin   . Bextra [Valdecoxib]   . Ciprofloxacin   . Floxin [Ofloxacin]   . Gyne-Lotrimin [Clotrimazole]   . Levaquin [Levofloxacin In D5w]   . Levofloxacin   . Quinolones Other (See Comments)  . Septra [Sulfamethoxazole-Trimethoprim] Other (See Comments)    Flushed and fever.  . Sulfa Antibiotics Other (See Comments)    fever  . Penicillins Rash    Social History   Social History  . Marital status: Married    Spouse name: N/A  . Number of children: N/A  . Years of education: N/A   Occupational History  . [place] History Main Topics  . Smoking status: Never Smoker  . Smokeless tobacco: Never Used  . Alcohol use No  . Drug use: No  . Sexual activity: Yes   Other Topics Concern  . Not on file   Social History Narrative  . No narrative on file    Family History  Problem Relation Age of Onset  . Cancer Mother   . Hyperlipidemia Mother   . Heart disease Mother   . Hypertension Mother   . Breast cancer Mother 85  . Kidney failure Mother   . Cancer Father   . Hyperlipidemia Father   . Heart disease Father   . Hypertension Father   . Breast cancer Paternal Aunt 58  . Breast cancer Maternal Aunt 85  . Kidney cancer Neg Hx   . Bladder Cancer Neg Hx   . Prostate cancer Neg Hx     The following portions of the patient's history were reviewed and updated as appropriate:  allergies, current medications, past family history, past medical history, past social history, past surgical history and problem list.  Review of Systems Review of Systems  Constitutional: Negative.  Intermittent vasomotor symptoms  HENT: Negative.   Eyes: Negative.   Respiratory: Negative.   Cardiovascular: Negative.   Gastrointestinal: Negative.   Genitourinary:       Recurrent UTIs, followed by [facility] urology  Musculoskeletal: Negative.   Skin: Negative.   Neurological: Negative.     Objective:  LMP 07/01/2014 (Exact Date)  CONSTITUTIONAL: Well-developed, well-nourished female in no acute distress.  PSYCHIATRIC: Normal mood and affect. Normal behavior. Normal judgment and thought content. [place]: Alert and oriented to person, place, and time. Normal muscle tone coordination. No cranial nerve deficit noted. HENT:  Normocephalic, atraumatic, External right and left ear normal. Oropharynx is clear and moist EYES: Conjunctivae and EOM are normal. . No scleral icterus.  NECK: Normal range of motion, supple, no masses.  Normal thyroid.  SKIN: Skin is warm and dry. No rash noted. Not diaphoretic. No erythema. No pallor. CARDIOVASCULAR: Normal heart rate noted, regular rhythm, no murmur. RESPIRATORY: Clear to auscultation bilaterally. Effort and breath sounds normal, no problems with respiration noted. BREASTS: Symmetric in size. No masses, skin changes, nipple drainage, or lymphadenopathy. ABDOMEN: Soft, normal bowel sounds, no distention noted.  No tenderness, rebound or guarding.  BLADDER: Normal PELVIC:  External Genitalia: Normal  BUS: Normal  Vagina: Moderate vaginal atrophy; second-degree cystourethrocele  Cervix: Normal  Uterus: Normal; midplane, normal size and shape, mobile, nontender  Adnexa: Normal  RV: External Exam NormaI, No Rectal Masses and Normal Sphincter tone  MUSCULOSKELETAL: Normal range of motion. No tenderness.  No cyanosis, clubbing, or edema.   2+ distal pulses. LYMPHATIC: No Axillary, Supraclavicular, or Inguinal Adenopathy.    Assessment:   Annual gynecologic examination 56 y.o. Contraception: post menopausal status Normal BMI Grade 2 cystocele Stress urinary incontinence, Minimal. Recurrent UTI. Vaginal atrophy, mildly symptomatic Plan:  Pap: pap/hpv Mammogram: Ordered Stool Guaiac Testing:  Ordered Labs:thru pcp Routine preventative health maintenance measures emphasized: Exercise/Diet/Weight control, Tobacco Warnings and Alcohol/Substance use risks  Consider ERT for vaginal atrophy symptoms Return to [place], CMA  Brayton Mars, MD  Note: This dictation was prepared with Dragon dictation along with smaller phrase technology. Any transcriptional errors that result from this process are unintentional.

## 2017-04-13 ENCOUNTER — Ambulatory Visit (INDEPENDENT_AMBULATORY_CARE_PROVIDER_SITE_OTHER): Payer: 59 | Admitting: Obstetrics and Gynecology

## 2017-04-13 ENCOUNTER — Encounter: Payer: Self-pay | Admitting: Obstetrics and Gynecology

## 2017-04-13 VITALS — BP 117/69 | HR 78 | Ht 62.0 in | Wt 128.8 lb

## 2017-04-13 DIAGNOSIS — Z1211 Encounter for screening for malignant neoplasm of colon: Secondary | ICD-10-CM

## 2017-04-13 DIAGNOSIS — Z01419 Encounter for gynecological examination (general) (routine) without abnormal findings: Secondary | ICD-10-CM | POA: Diagnosis not present

## 2017-04-13 DIAGNOSIS — Z1231 Encounter for screening mammogram for malignant neoplasm of breast: Secondary | ICD-10-CM

## 2017-04-13 DIAGNOSIS — Z803 Family history of malignant neoplasm of breast: Secondary | ICD-10-CM | POA: Diagnosis not present

## 2017-04-13 DIAGNOSIS — N393 Stress incontinence (female) (male): Secondary | ICD-10-CM | POA: Diagnosis not present

## 2017-04-13 DIAGNOSIS — N952 Postmenopausal atrophic vaginitis: Secondary | ICD-10-CM | POA: Diagnosis not present

## 2017-04-13 DIAGNOSIS — N390 Urinary tract infection, site not specified: Secondary | ICD-10-CM | POA: Diagnosis not present

## 2017-04-13 DIAGNOSIS — N809 Endometriosis, unspecified: Secondary | ICD-10-CM | POA: Diagnosis not present

## 2017-04-13 NOTE — Patient Instructions (Signed)
1.  Pap smear is done. 2.  Mammogram is ordered. 3.  Stool guaiac cards are given for colon cancer screening. 4.  Screening labs are obtained through primary care. 5  Continue with healthy eating and exercise. 6.  Recommend calcium with vitamin D supplementation daily 7.  Consider vaginal estrogen therapy for vaginal atrophy and recurrent UTIs. 7.  Return in one year for annual exam   Health Maintenance for Postmenopausal Women Menopause is a normal process in which your reproductive ability comes to an end. This process happens gradually over a span of months to years, usually between the ages of 25 and 77. Menopause is complete when you have missed 12 consecutive menstrual periods. It is important to talk with your health care provider about some of the most common conditions that affect postmenopausal women, such as heart disease, cancer, and bone loss (osteoporosis). Adopting a healthy lifestyle and getting preventive care can help to promote your health and wellness. Those actions can also lower your chances of developing some of these common conditions. What should I know about menopause? During menopause, you may experience a number of symptoms, such as:  Moderate-to-severe hot flashes.  Night sweats.  Decrease in sex drive.  Mood swings.  Headaches.  Tiredness.  Irritability.  Memory problems.  Insomnia.  Choosing to treat or not to treat menopausal changes is an individual decision that you make with your health care provider. What should I know about hormone replacement therapy and supplements? Hormone therapy products are effective for treating symptoms that are associated with menopause, such as hot flashes and night sweats. Hormone replacement carries certain risks, especially as you become older. If you are thinking about using estrogen or estrogen with progestin treatments, discuss the benefits and risks with your health care provider. What should I know about  heart disease and stroke? Heart disease, heart attack, and stroke become more likely as you age. This may be due, in part, to the hormonal changes that your body experiences during menopause. These can affect how your body processes dietary fats, triglycerides, and cholesterol. Heart attack and stroke are both medical emergencies. There are many things that you can do to help prevent heart disease and stroke:  Have your blood pressure checked at least every 1-2 years. High blood pressure causes heart disease and increases the risk of stroke.  If you are 35-67 years old, ask your health care provider if you should take aspirin to prevent a heart attack or a stroke.  Do not use any tobacco products, including cigarettes, chewing tobacco, or electronic cigarettes. If you need help quitting, ask your health care provider.  It is important to eat a healthy diet and maintain a healthy weight. ? Be sure to include plenty of vegetables, fruits, low-fat dairy products, and lean protein. ? Avoid eating foods that are high in solid fats, added sugars, or salt (sodium).  Get regular exercise. This is one of the most important things that you can do for your health. ? Try to exercise for at least 150 minutes each week. The type of exercise that you do should increase your heart rate and make you sweat. This is known as moderate-intensity exercise. ? Try to do strengthening exercises at least twice each week. Do these in addition to the moderate-intensity exercise.  Know your numbers.Ask your health care provider to check your cholesterol and your blood glucose. Continue to have your blood tested as directed by your health care provider.  What should I  know about cancer screening? There are several types of cancer. Take the following steps to reduce your risk and to catch any cancer development as early as possible. Breast Cancer  Practice breast self-awareness. ? This means understanding how your  breasts normally appear and feel. ? It also means doing regular breast self-exams. Let your health care provider know about any changes, no matter how small.  If you are 24 or older, have a clinician do a breast exam (clinical breast exam or CBE) every year. Depending on your age, family history, and medical history, it may be recommended that you also have a yearly breast X-ray (mammogram).  If you have a family history of breast cancer, talk with your health care provider about genetic screening.  If you are at high risk for breast cancer, talk with your health care provider about having an MRI and a mammogram every year.  Breast cancer (BRCA) gene test is recommended for women who have family members with BRCA-related cancers. Results of the assessment will determine the need for genetic counseling and BRCA1 and for BRCA2 testing. BRCA-related cancers include these types: ? Breast. This occurs in males or females. ? Ovarian. ? Tubal. This may also be called fallopian tube cancer. ? Cancer of the abdominal or pelvic lining (peritoneal cancer). ? Prostate. ? Pancreatic.  Cervical, Uterine, and Ovarian Cancer Your health care provider may recommend that you be screened regularly for cancer of the pelvic organs. These include your ovaries, uterus, and vagina. This screening involves a pelvic exam, which includes checking for microscopic changes to the surface of your cervix (Pap test).  For women ages 21-65, health care providers may recommend a pelvic exam and a Pap test every three years. For women ages 52-65, they may recommend the Pap test and pelvic exam, combined with testing for human papilloma virus (HPV), every five years. Some types of HPV increase your risk of cervical cancer. Testing for HPV may also be done on women of any age who have unclear Pap test results.  Other health care providers may not recommend any screening for nonpregnant women who are considered low risk for pelvic  cancer and have no symptoms. Ask your health care provider if a screening pelvic exam is right for you.  If you have had past treatment for cervical cancer or a condition that could lead to cancer, you need Pap tests and screening for cancer for at least 20 years after your treatment. If Pap tests have been discontinued for you, your risk factors (such as having a new sexual partner) need to be reassessed to determine if you should start having screenings again. Some women have medical problems that increase the chance of getting cervical cancer. In these cases, your health care provider may recommend that you have screening and Pap tests more often.  If you have a family history of uterine cancer or ovarian cancer, talk with your health care provider about genetic screening.  If you have vaginal bleeding after reaching menopause, tell your health care provider.  There are currently no reliable tests available to screen for ovarian cancer.  Lung Cancer Lung cancer screening is recommended for adults 61-75 years old who are at high risk for lung cancer because of a history of smoking. A yearly low-dose CT scan of the lungs is recommended if you:  Currently smoke.  Have a history of at least 30 pack-years of smoking and you currently smoke or have quit within the past 15 years. A  pack-year is smoking an average of one pack of cigarettes per day for one year.  Yearly screening should:  Continue until it has been 15 years since you quit.  Stop if you develop a health problem that would prevent you from having lung cancer treatment.  Colorectal Cancer  This type of cancer can be detected and can often be prevented.  Routine colorectal cancer screening usually begins at age 61 and continues through age 47.  If you have risk factors for colon cancer, your health care provider may recommend that you be screened at an earlier age.  If you have a family history of colorectal cancer, talk with  your health care provider about genetic screening.  Your health care provider may also recommend using home test kits to check for hidden blood in your stool.  A small camera at the end of a tube can be used to examine your colon directly (sigmoidoscopy or colonoscopy). This is done to check for the earliest forms of colorectal cancer.  Direct examination of the colon should be repeated every 5-10 years until age 76. However, if early forms of precancerous polyps or small growths are found or if you have a family history or genetic risk for colorectal cancer, you may need to be screened more often.  Skin Cancer  Check your skin from head to toe regularly.  Monitor any moles. Be sure to tell your health care provider: ? About any new moles or changes in moles, especially if there is a change in a mole's shape or color. ? If you have a mole that is larger than the size of a pencil eraser.  If any of your family members has a history of skin cancer, especially at a young age, talk with your health care provider about genetic screening.  Always use sunscreen. Apply sunscreen liberally and repeatedly throughout the day.  Whenever you are outside, protect yourself by wearing long sleeves, pants, a wide-brimmed hat, and sunglasses.  What should I know about osteoporosis? Osteoporosis is a condition in which bone destruction happens more quickly than new bone creation. After menopause, you may be at an increased risk for osteoporosis. To help prevent osteoporosis or the bone fractures that can happen because of osteoporosis, the following is recommended:  If you are 67-73 years old, get at least 1,000 mg of calcium and at least 600 mg of vitamin D per day.  If you are older than age 52 but younger than age 29, get at least 1,200 mg of calcium and at least 600 mg of vitamin D per day.  If you are older than age 38, get at least 1,200 mg of calcium and at least 800 mg of vitamin D per  day.  Smoking and excessive alcohol intake increase the risk of osteoporosis. Eat foods that are rich in calcium and vitamin D, and do weight-bearing exercises several times each week as directed by your health care provider. What should I know about how menopause affects my mental health? Depression may occur at any age, but it is more common as you become older. Common symptoms of depression include:  Low or sad mood.  Changes in sleep patterns.  Changes in appetite or eating patterns.  Feeling an overall lack of motivation or enjoyment of activities that you previously enjoyed.  Frequent crying spells.  Talk with your health care provider if you think that you are experiencing depression. What should I know about immunizations? It is important that you get  and maintain your immunizations. These include:  Tetanus, diphtheria, and pertussis (Tdap) booster vaccine.  Influenza every year before the flu season begins.  Pneumonia vaccine.  Shingles vaccine.  Your health care provider may also recommend other immunizations. This information is not intended to replace advice given to you by your health care provider. Make sure you discuss any questions you have with your health care provider. Document Released: 10/01/2005 Document Revised: 02/27/2016 Document Reviewed: 05/13/2015 Elsevier Interactive Patient Education  2018 [employer].

## 2017-04-15 LAB — PAPIG, HPV, RFX 16/18
HPV, high-risk: NEGATIVE
PAP SMEAR COMMENT: 0

## 2017-05-04 ENCOUNTER — Ambulatory Visit
Admission: RE | Admit: 2017-05-04 | Discharge: 2017-05-04 | Disposition: A | Discharge status: Home / Self Care | Payer: 59 | Source: Ambulatory Visit | Attending: Obstetrics and Gynecology | Admitting: Obstetrics and Gynecology

## 2017-05-04 DIAGNOSIS — Z1231 Encounter for screening mammogram for malignant neoplasm of breast: Secondary | ICD-10-CM | POA: Diagnosis not present

## 2017-05-11 DIAGNOSIS — Z08 Encounter for follow-up examination after completed treatment for malignant neoplasm: Secondary | ICD-10-CM | POA: Diagnosis not present

## 2017-05-11 DIAGNOSIS — D485 Neoplasm of uncertain behavior of skin: Secondary | ICD-10-CM | POA: Diagnosis not present

## 2017-05-11 DIAGNOSIS — Z85828 Personal history of other malignant neoplasm of skin: Secondary | ICD-10-CM | POA: Diagnosis not present

## 2017-05-11 DIAGNOSIS — L820 Inflamed seborrheic keratosis: Secondary | ICD-10-CM | POA: Diagnosis not present

## 2017-05-11 DIAGNOSIS — L910 Hypertrophic scar: Secondary | ICD-10-CM | POA: Diagnosis not present

## 2017-05-11 DIAGNOSIS — C44712 Basal cell carcinoma of skin of right lower limb, including hip: Secondary | ICD-10-CM | POA: Diagnosis not present

## 2017-05-11 DIAGNOSIS — Z1211 Encounter for screening for malignant neoplasm of colon: Secondary | ICD-10-CM | POA: Diagnosis not present

## 2017-05-13 DIAGNOSIS — R634 Abnormal weight loss: Secondary | ICD-10-CM | POA: Diagnosis not present

## 2017-05-13 DIAGNOSIS — E7800 Pure hypercholesterolemia, unspecified: Secondary | ICD-10-CM | POA: Diagnosis not present

## 2017-05-13 DIAGNOSIS — R7303 Prediabetes: Secondary | ICD-10-CM | POA: Diagnosis not present

## 2017-05-18 DIAGNOSIS — E7800 Pure hypercholesterolemia, unspecified: Secondary | ICD-10-CM | POA: Diagnosis not present

## 2017-05-18 DIAGNOSIS — R7303 Prediabetes: Secondary | ICD-10-CM | POA: Diagnosis not present

## 2017-05-18 DIAGNOSIS — Z8639 Personal history of other endocrine, nutritional and metabolic disease: Secondary | ICD-10-CM | POA: Diagnosis not present

## 2017-05-19 DIAGNOSIS — C44712 Basal cell carcinoma of skin of right lower limb, including hip: Secondary | ICD-10-CM | POA: Diagnosis not present

## 2017-05-19 LAB — FECAL OCCULT BLOOD, IMMUNOCHEMICAL: FECAL OCCULT BLD: NEGATIVE

## 2017-06-07 NOTE — Progress Notes (Signed)
06/08/2017 2:54 PM   Peak [birth year] [id number]  Referring provider: Dion Body, MD [place] [facility] [place], [place] [address]  Chief Complaint  Patient presents with  . Recurrent UTI    3 month follow up  . Vaginal Atrophy    HPI: 56 yo WF with recurrent UTI's, vaginal atrophy, SUI and cystocele for a three month follow up.    Background history Patient is a 16 -year-old Caucasian female who is referred to [place] by, Dr. Netty Starring, for recurrent urinary tract infections.  Patient states that she has had 2 urinary tract infections over the six months and 3 over the last year.  Her symptoms with a urinary tract infection consist of malodorous urine, nausea, fecal incontinence and stomach ache.  She denies dysuria, gross hematuria, back pain, abdominal pain or flank pain.   She has not had any recent fevers, chills or vomiting.  She does not have a history of nephrolithiasis, GU surgery or GU trauma.  Reviewing her records,  she has had one documented UTI for E. Coli 09/09/2014 .  She had been on suppressive antibiotics in the remote past.  She is sexually active.  She has not noted a correlation with her urinary tract infections and sexual intercourse.  She is post menopausal.   She does not engage in anal sex.   She is voiding before and after sex.  She admits to constipation.   She does engage in good perineal hygiene. She does not take tub baths.  She has SUI incontinence.  She is using incontinence pads.   She is not having pain with bladder filling.  She has not had any recent imaging studies.  She is drinking 3 glasses of water daily.   She loves coffee and diet soda.  She is not taking cranberry tablets or probiotics.  She did take Vitamin C, but it caused bruising.  Her PVR was 27 mL.    I've seen her initially in April 2018. One month later, she contact the office complaining of symptoms of urinary tract infection. Urine culture was  positive for E. Coli.  She was treated with a 7 day course of Macrobid.  3 weeks later her symptoms returned and her catheterized urine was positive for Escherichia coli again. She was given another 7 day course of Macrobid.  RUS performed on 02/28/2017 was normal.    Today, she is experiencing urgency x 0-3, frequency x 4-7, not restricting fluids to avoid visits to the restroom, not engaging in toilet mapping, incontinence x 0-3 and nocturia x 0-3.   Her PVR is 44 mL.  She is not having dysuria, gross hematuria or suprapubic pain.  She is not having fevers, chills, nausea or vomiting.  She has not had an UTI since she last saw [place].  She has not been taking the Keflex after sex.      PMH: Past Medical History:  Diagnosis Date  . Allergy   . Asthma   . Chicken pox   . Chronic UTI   . Cystocele    2nd degree  . Endometriosis   . Family history of polyps in the colon   . Graves disease   . History of heavy periods   . Hyperlipemia   . IBS (irritable bowel syndrome)   . Perimenopausal   . Rectocele    mild  . S/P endometrial ablation   . Skin cancer   . SUI (stress urinary incontinence, female)   .  SVT (supraventricular tachycardia) ([place])   . Urine incontinence     Surgical History: Past Surgical History:  Procedure Laterality Date  . endometrrial ablation    . EYE SURGERY    . knee athroscopy Left 2013  . laprascopic surgery     endometreosis  . THYROIDECTOMY, PARTIAL    . TONSILLECTOMY AND ADENOIDECTOMY    . tubes in ears      Home Medications:  Allergies as of 06/08/2017      Reactions   Amoxicillin    Bextra [valdecoxib]    Ciprofloxacin    Floxin [ofloxacin]    Gyne-lotrimin [clotrimazole]    Levofloxacin    Penicillin V Potassium Swelling   Caused fever and swelling   Quinolones Other (See Comments)   Septra [sulfamethoxazole-trimethoprim] Other (See Comments)   Flushed and fever.   Sulfa Antibiotics Other (See Comments)   fever   Penicillins Rash       Medication List       Accurate as of 06/08/17  2:54 PM. Always use your most recent med list.          albuterol 108 (90 Base) MCG/ACT inhaler Commonly known as:  PROVENTIL HFA;VENTOLIN HFA Inhale 2 puffs into the lungs every 6 (six) hours as needed for wheezing or shortness of breath.   ALLEGRA ALLERGY 180 MG tablet Generic drug:  fexofenadine Allegra Allergy 180 mg tablet  Take 1 tablet every day by oral route.   aspirin 81 MG tablet Take 81 mg by mouth daily.   cephALEXin 250 MG capsule Commonly known as:  KEFLEX Take one tablet after intercourse   cholecalciferol 1000 units tablet Commonly known as:  VITAMIN D Take 1,000 Units by mouth daily.   CRANBERRY PO Take by mouth.   EPIPEN 2-PAK 0.3 mg/0.3 mL Soaj injection Generic drug:  EPINEPHrine USE FOR ANAPHYLAXIS   fluticasone 50 MCG/ACT nasal spray Commonly known as:  FLONASE Place 1 spray into both nostrils daily.   imipramine 25 MG tablet Commonly known as:  TOFRANIL Take 50 mg by mouth at bedtime.   ketotifen 0.025 % ophthalmic solution Commonly known as:  ZADITOR Place 1 drop into both eyes 2 (two) times daily.   levothyroxine 88 MCG tablet Commonly known as:  SYNTHROID, LEVOTHROID Take 88 mcg by mouth daily before breakfast.   lovastatin 20 MG tablet Commonly known as:  MEVACOR Take by mouth.   lovastatin 20 MG tablet Commonly known as:  MEVACOR Take by mouth.   metoprolol succinate 50 MG 24 hr tablet Commonly known as:  TOPROL-XL Take 50 mg by mouth daily. Take with or immediately following a meal.   montelukast 10 MG tablet Commonly known as:  SINGULAIR Take 10 mg by mouth at bedtime.   multivitamin tablet Take 1 tablet by mouth daily.   MULTI-VITAMINS Tabs Take by mouth.   PROBIOTIC PO Take by mouth.       Allergies:  Allergies  Allergen Reactions  . Amoxicillin   . Bextra [Valdecoxib]   . Ciprofloxacin   . Floxin [Ofloxacin]   . Gyne-Lotrimin [Clotrimazole]   .  Levofloxacin   . Penicillin V Potassium Swelling    Caused fever and swelling  . Quinolones Other (See Comments)  . Septra [Sulfamethoxazole-Trimethoprim] Other (See Comments)    Flushed and fever.  . Sulfa Antibiotics Other (See Comments)    fever  . Penicillins Rash    Family History: Family History  Problem Relation Age of Onset  . Cancer Mother   .  Hyperlipidemia Mother   . Heart disease Mother   . Hypertension Mother   . Breast cancer Mother 72  . Kidney failure Mother   . Hyperlipidemia Father   . Heart disease Father   . Hypertension Father   . Cancer Father        colon  . Breast cancer Paternal Aunt 20  . Breast cancer Maternal Aunt 85  . Kidney cancer Neg Hx   . Bladder Cancer Neg Hx   . Prostate cancer Neg Hx   . Ovarian cancer Neg Hx   . Diabetes Neg Hx     Social History:  reports that she has never smoked. She has never used smokeless tobacco. She reports that she does not drink alcohol or use drugs.  ROS: UROLOGY Frequent Urination?: No Hard to postpone urination?: No Burning/pain with urination?: No Get up at night to urinate?: No Leakage of urine?: No Urine stream starts and stops?: No Trouble starting stream?: No Do you have to strain to urinate?: No Blood in urine?: No Urinary tract infection?: No Sexually transmitted disease?: No Injury to kidneys or bladder?: No Painful intercourse?: No Weak stream?: No Currently pregnant?: No Vaginal bleeding?: No Last menstrual period?: n  Gastrointestinal Nausea?: No Vomiting?: No Indigestion/heartburn?: No Diarrhea?: No Constipation?: No  Constitutional Fever: No Night sweats?: No Weight loss?: No Fatigue?: No  Skin Skin rash/lesions?: No Itching?: No  Eyes Blurred vision?: No Double vision?: No  Ears/Nose/Throat Sore throat?: No Sinus problems?: No  Hematologic/Lymphatic Swollen glands?: No Easy bruising?: No  Cardiovascular Leg swelling?: No Chest pain?:  No  Respiratory Cough?: No Shortness of breath?: No  Endocrine Excessive thirst?: No  Musculoskeletal Back pain?: No Joint pain?: No  Neurological Headaches?: No Dizziness?: No  Psychologic Depression?: No Anxiety?: No  Physical Exam: BP 125/81   Pulse 84   Ht '5\' 2"'  (1.575 m)   Wt 127 lb 6.4 oz (57.8 kg)   LMP 07/01/2014 (Exact Date)   BMI 23.30 kg/m   Constitutional: Well nourished. Alert and oriented, No acute distress. HEENT: [place] AT, moist mucus membranes. Trachea midline, no masses. Cardiovascular: No clubbing, cyanosis, or edema. Respiratory: Normal respiratory effort, no increased work of breathing. Skin: No rashes, bruises or suspicious lesions. Lymph: No cervical or inguinal adenopathy. Neurologic: Grossly intact, no focal deficits, moving all 4 extremities. Psychiatric: Normal mood and affect.  Pertinent Imaging: CLINICAL DATA:  55 year old female with chronic urinary tract infections. Initial encounter.  EXAM: RENAL / URINARY TRACT ULTRASOUND COMPLETE  COMPARISON:  None.  FINDINGS: Right Kidney:  Length: 10.3 cm. Echogenicity within normal limits. No mass or hydronephrosis visualized.  Left Kidney:  Length: 10.2 cm. Echogenicity within normal limits. No mass or hydronephrosis visualized.  Bladder:  Appears normal for degree of bladder distention.  IMPRESSION: Negative exam.   Electronically Signed   By: Genia Del M.D.   On: 02/28/2017 18:56  Assessment & Plan:    1. Recurrent UTI's  - RUS found not etiology for UTI's  - criteria for recurrent UTI has been met with 2 or more infections in 6 months or 3 or greater infections in one year per patient's report, but there are no documented UTI's  - Patient is encouraged to increase their water intake until the urine is pale yellow or clear (10 to 12 cups daily)   - encouraged to take probiotics (yogurt, oral pills or vaginal suppositories), take cranberry pills or drink  the juice  - Vitamin C 1,000 mg daily caused  bruising  - avoid soaking in tubs and wipe front to back after urinating   - benefit from core strengthening exercises has been seen - she has had pelvic floor PT in the past  - advised them to have CATH UA's for urinalysis and culture to prevent skin contamination of the specimen  - reviewed symptoms of UTI and advised not to have urine checked or be treated for UTI if not experiencing symptoms  - discussed antibiotic stewardship with the patient  - start Keflex 250 mg for sexual prophylaxis   - RTC in 3 months for OAB questionnaire and PVR  2. Vaginal atrophy  - Patient does not want to use hormone creams as her mother has been diagnosed with breast cancer  3. SUI  - not bothersome to patient at this time  - managed with imipramine by PCP  4. Cystocele  - see above                                   Return in about 1 year (around 06/08/2018) for PVR and OAB questionnaire.  These notes generated with voice recognition software. I apologize for typographical errors.  Zara Council, [facility] [address], [place], [place] [address] [phone]

## 2017-06-08 ENCOUNTER — Ambulatory Visit: Payer: 59 | Admitting: Urology

## 2017-06-08 ENCOUNTER — Encounter: Payer: Self-pay | Admitting: Urology

## 2017-06-08 VITALS — BP 125/81 | HR 84 | Ht 62.0 in | Wt 127.4 lb

## 2017-06-08 DIAGNOSIS — N8111 Cystocele, midline: Secondary | ICD-10-CM

## 2017-06-08 DIAGNOSIS — N393 Stress incontinence (female) (male): Secondary | ICD-10-CM

## 2017-06-08 DIAGNOSIS — N952 Postmenopausal atrophic vaginitis: Secondary | ICD-10-CM

## 2017-06-08 DIAGNOSIS — Z87440 Personal history of urinary (tract) infections: Secondary | ICD-10-CM | POA: Diagnosis not present

## 2017-06-08 LAB — BLADDER SCAN AMB NON-IMAGING: Scan Result: 44

## 2017-07-18 DIAGNOSIS — H11433 Conjunctival hyperemia, bilateral: Secondary | ICD-10-CM | POA: Diagnosis not present

## 2017-07-22 DIAGNOSIS — Z8639 Personal history of other endocrine, nutritional and metabolic disease: Secondary | ICD-10-CM | POA: Diagnosis not present

## 2017-08-24 DIAGNOSIS — H5213 Myopia, bilateral: Secondary | ICD-10-CM | POA: Diagnosis not present

## 2017-09-22 ENCOUNTER — Ambulatory Visit: Payer: Self-pay | Admitting: Family Medicine

## 2017-09-22 VITALS — BP 118/77 | HR 77 | Temp 98.2°F | Wt 128.0 lb

## 2017-09-22 DIAGNOSIS — H9202 Otalgia, left ear: Secondary | ICD-10-CM

## 2017-09-22 MED ORDER — NEOMYCIN-POLYMYXIN-HC 3.5-10000-1 OT SOLN: 3.0000 [drp] | Freq: Three times a day (TID) | OTIC | 0 refills | Status: AC

## 2017-09-22 NOTE — Progress Notes (Signed)
Subjective:  [name] is a 57 y.o. female who presents for evaluation of left ear pain with drainage.  Symptoms include left ear pressure/pain.  Onset of symptoms was 4 days ago, and has been gradually worsening since that time.  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 Complains of associated dark yellowing discharge draining from left ear only over the course of 4 days.  Drainage is most pronounced upon awakening in the morning.  This has happened more than 10 years ago in the past and she was treated with otic antibiotics.  She is prescribed chronic Keflex for chronic cystitis and has been taking Keflex over the course of the last 4 days without relief of symptoms for ear.  The following portions of the patient's history were reviewed and updated as appropriate:  allergies, current medications and past medical history.  Pertinent items are noted in HPI.  Blood pressure 118/77, pulse 77, temperature 98.2 F (36.8 C), weight 128 lb (58.1 kg), last menstrual period 07/01/2014, SpO2 96 %. Objective:  Ears: abnormal external canal left ear - edematous, erythematous and dried discharge noted within canal.    Assessment:  Left Ear Otitis Externa     Plan:  Will trial neomycin-polymyxin-hydrocortisone 3 drops 3 times daily times 10 days for left ear infection.  If symptoms worsen or does not improve return for follow-up.   Meds ordered this encounter  Medications  . neomycin-polymyxin-hydrocortisone (CORTISPORIN) OTIC solution    Sig: Place 3 drops into the left ear 3 (three) times daily for 7 days.    Dispense:  10 mL    Refill:  0    Carroll Sage. Kenton Kingfisher, MSN, [place], [place] [address] [phone]

## 2017-09-22 NOTE — Patient Instructions (Addendum)
I am prescribing neomycin-polymyxin-hydrocortisone 3 drops, 3 times daily for 10 days for left ear infection.  If symptoms worsen, return for care.         Otitis Externa Otitis externa is an infection of the outer ear canal. The outer ear canal is the area between the outside of the ear and the eardrum. Otitis externa is sometimes called "swimmer's ear." Follow these instructions at home:  If you were given antibiotic ear drops, use them as told by your doctor. Do not stop using them even if your condition gets better.  Take over-the-counter and prescription medicines only as told by your doctor.  Keep all follow-up visits as told by your doctor. This is important. How is this prevented?  Keep your ear dry. Use the corner of a towel to dry your ear after you swim or bathe.  Try not to scratch or put things in your ear. Doing these things makes it easier for germs to grow in your ear.  Avoid swimming in lakes, dirty water, or pools that may not have the right amount of a chemical called chlorine.  Consider making ear drops and putting 3 or 4 drops in each ear after you swim. Ask your doctor about how you can make ear drops. Contact a doctor if:  You have a fever.  After 3 days your ear is still red, swollen, or painful.  After 3 days you still have pus coming from your ear.  Your redness, swelling, or pain gets worse.  You have a really bad headache.  You have redness, swelling, pain, or tenderness behind your ear. This information is not intended to replace advice given to you by your health care provider. Make sure you discuss any questions you have with your health care provider. Document Released: 01/26/2008 Document Revised: 09/04/2015 Document Reviewed: 05/19/2015 Elsevier Interactive Patient Education  [employer].

## 2017-09-28 DIAGNOSIS — D485 Neoplasm of uncertain behavior of skin: Secondary | ICD-10-CM | POA: Diagnosis not present

## 2017-09-28 DIAGNOSIS — L905 Scar conditions and fibrosis of skin: Secondary | ICD-10-CM | POA: Diagnosis not present

## 2017-09-28 DIAGNOSIS — D2272 Melanocytic nevi of left lower limb, including hip: Secondary | ICD-10-CM | POA: Diagnosis not present

## 2017-09-28 DIAGNOSIS — D2262 Melanocytic nevi of left upper limb, including shoulder: Secondary | ICD-10-CM | POA: Diagnosis not present

## 2017-09-28 DIAGNOSIS — Z85828 Personal history of other malignant neoplasm of skin: Secondary | ICD-10-CM | POA: Diagnosis not present

## 2017-09-28 DIAGNOSIS — D225 Melanocytic nevi of trunk: Secondary | ICD-10-CM | POA: Diagnosis not present

## 2017-10-26 DIAGNOSIS — R079 Chest pain, unspecified: Secondary | ICD-10-CM | POA: Diagnosis not present

## 2017-10-26 DIAGNOSIS — I471 Supraventricular tachycardia: Secondary | ICD-10-CM | POA: Diagnosis not present

## 2017-11-18 DIAGNOSIS — Z8639 Personal history of other endocrine, nutritional and metabolic disease: Secondary | ICD-10-CM | POA: Diagnosis not present

## 2017-11-18 DIAGNOSIS — Z0000 Encounter for general adult medical examination without abnormal findings: Secondary | ICD-10-CM | POA: Diagnosis not present

## 2017-11-18 DIAGNOSIS — R7303 Prediabetes: Secondary | ICD-10-CM | POA: Diagnosis not present

## 2017-11-18 DIAGNOSIS — E7800 Pure hypercholesterolemia, unspecified: Secondary | ICD-10-CM | POA: Diagnosis not present

## 2017-11-18 DIAGNOSIS — E559 Vitamin D deficiency, unspecified: Secondary | ICD-10-CM | POA: Diagnosis not present

## 2017-11-23 DIAGNOSIS — E7800 Pure hypercholesterolemia, unspecified: Secondary | ICD-10-CM | POA: Diagnosis not present

## 2017-11-23 DIAGNOSIS — Z0000 Encounter for general adult medical examination without abnormal findings: Secondary | ICD-10-CM | POA: Diagnosis not present

## 2017-11-23 DIAGNOSIS — Z8639 Personal history of other endocrine, nutritional and metabolic disease: Secondary | ICD-10-CM | POA: Diagnosis not present

## 2017-11-23 DIAGNOSIS — R7303 Prediabetes: Secondary | ICD-10-CM | POA: Diagnosis not present

## 2017-11-24 DIAGNOSIS — H6982 Other specified disorders of Eustachian tube, left ear: Secondary | ICD-10-CM | POA: Diagnosis not present

## 2017-11-24 DIAGNOSIS — H7201 Central perforation of tympanic membrane, right ear: Secondary | ICD-10-CM | POA: Diagnosis not present

## 2017-12-06 DIAGNOSIS — H906 Mixed conductive and sensorineural hearing loss, bilateral: Secondary | ICD-10-CM | POA: Diagnosis not present

## 2017-12-06 DIAGNOSIS — H6983 Other specified disorders of Eustachian tube, bilateral: Secondary | ICD-10-CM | POA: Diagnosis not present

## 2017-12-06 DIAGNOSIS — H7201 Central perforation of tympanic membrane, right ear: Secondary | ICD-10-CM | POA: Diagnosis not present

## 2018-02-03 DIAGNOSIS — H903 Sensorineural hearing loss, bilateral: Secondary | ICD-10-CM | POA: Diagnosis not present

## 2018-02-03 DIAGNOSIS — H9211 Otorrhea, right ear: Secondary | ICD-10-CM | POA: Diagnosis not present

## 2018-02-14 DIAGNOSIS — H903 Sensorineural hearing loss, bilateral: Secondary | ICD-10-CM | POA: Diagnosis not present

## 2018-03-08 DIAGNOSIS — H6500 Acute serous otitis media, unspecified ear: Secondary | ICD-10-CM | POA: Diagnosis not present

## 2018-03-08 DIAGNOSIS — H908 Mixed conductive and sensorineural hearing loss, unspecified: Secondary | ICD-10-CM | POA: Diagnosis not present

## 2018-03-22 DIAGNOSIS — H906 Mixed conductive and sensorineural hearing loss, bilateral: Secondary | ICD-10-CM | POA: Diagnosis not present

## 2018-03-22 DIAGNOSIS — H908 Mixed conductive and sensorineural hearing loss, unspecified: Secondary | ICD-10-CM | POA: Diagnosis not present

## 2018-03-22 DIAGNOSIS — H6500 Acute serous otitis media, unspecified ear: Secondary | ICD-10-CM | POA: Diagnosis not present

## 2018-04-07 DIAGNOSIS — H7203 Central perforation of tympanic membrane, bilateral: Secondary | ICD-10-CM | POA: Diagnosis not present

## 2018-04-07 DIAGNOSIS — H908 Mixed conductive and sensorineural hearing loss, unspecified: Secondary | ICD-10-CM | POA: Diagnosis not present

## 2018-04-12 DIAGNOSIS — L821 Other seborrheic keratosis: Secondary | ICD-10-CM | POA: Diagnosis not present

## 2018-04-12 DIAGNOSIS — D2371 Other benign neoplasm of skin of right lower limb, including hip: Secondary | ICD-10-CM | POA: Diagnosis not present

## 2018-04-12 DIAGNOSIS — D2372 Other benign neoplasm of skin of left lower limb, including hip: Secondary | ICD-10-CM | POA: Diagnosis not present

## 2018-04-12 DIAGNOSIS — Z08 Encounter for follow-up examination after completed treatment for malignant neoplasm: Secondary | ICD-10-CM | POA: Diagnosis not present

## 2018-04-12 DIAGNOSIS — Z85828 Personal history of other malignant neoplasm of skin: Secondary | ICD-10-CM | POA: Diagnosis not present

## 2018-04-17 NOTE — Progress Notes (Signed)
ANNUAL PREVENTATIVE CARE GYN  ENCOUNTER NOTE  Subjective:       [name] is a 57 y.o. G26P3003 female here for a routine annual gynecologic exam.  Current complaints: 1.   None  Patient has intermittent vasomotor symptoms.  She does not desire systemic HRT due to family history of breast cancer. Vaginal atrophy symptoms are slightly troublesome and she is interested in a trial of vaginal estrogen therapy. Stress incontinence has resolved following physical therapy consultation. [name] is taking vitamin D daily but is not taking calcium consistently. [name] is exercising at least 4 times a week for 45 minutes per session.  Gynecologic History Patient's last menstrual period was 07/01/2014 (exact date). Contraception: post menopausal status November 2015 Last Pap: 03/2017 neg/neg. Results were: normal Last mammogram: 04/2017 birad 1. Results were: normal  Obstetric History OB History  Gravida Para Term Preterm AB Living  3 3 3     3   SAB TAB Ectopic Multiple Live Births          3    # Outcome Date GA Lbr Len/2nd Weight Sex Delivery Anes PTL Lv  3 Term 08/08/96    F CS-Unspec   LIV  2 Term 08/23/95    M CS-Unspec   LIV  1 Term 10/25/93    M CS-Unspec   LIV    Past Medical History:  Diagnosis Date  . Allergy   . Asthma   . Chicken pox   . Chronic UTI   . Cystocele    2nd degree  . Endometriosis   . Family history of polyps in the colon   . Graves disease   . History of heavy periods   . Hyperlipemia   . IBS (irritable bowel syndrome)   . Perimenopausal   . Rectocele    mild  . S/P endometrial ablation   . Skin cancer   . SUI (stress urinary incontinence, female)   . SVT (supraventricular tachycardia) ([place])   . Urine incontinence     Past Surgical History:  Procedure Laterality Date  . endometrrial ablation    . EYE SURGERY    . knee athroscopy Left 2013  . laprascopic surgery     endometreosis  . THYROIDECTOMY, PARTIAL    . TONSILLECTOMY AND  ADENOIDECTOMY    . tubes in ears      Current Outpatient Medications on File Prior to Visit  Medication Sig Dispense Refill  . albuterol (PROVENTIL HFA;VENTOLIN HFA) 108 (90 BASE) MCG/ACT inhaler Inhale 2 puffs into the lungs every 6 (six) hours as needed for wheezing or shortness of breath.    Marland Kitchen aspirin 81 MG tablet Take 81 mg by mouth daily.    . cephALEXin (KEFLEX) 250 MG capsule Take one tablet after intercourse 90 capsule 0  . cholecalciferol (VITAMIN D) 1000 UNITS tablet Take 1,000 Units by mouth daily.    Marland Kitchen CRANBERRY PO Take by mouth.    . EPIPEN 2-PAK 0.3 MG/0.3ML SOAJ injection USE FOR ANAPHYLAXIS 2 Device 3  . fexofenadine (ALLEGRA ALLERGY) 180 MG tablet Allegra Allergy 180 mg tablet  Take 1 tablet every day by oral route.    . fluticasone (FLONASE) 50 MCG/ACT nasal spray Place 1 spray into both nostrils daily.    Marland Kitchen imipramine (TOFRANIL) 25 MG tablet Take 50 mg by mouth at bedtime.    Marland Kitchen ketotifen (ZADITOR) 0.025 % ophthalmic solution Place 1 drop into both eyes 2 (two) times daily.    Marland Kitchen levothyroxine (SYNTHROID, LEVOTHROID) 88  MCG tablet Take 88 mcg by mouth daily before breakfast.     . lovastatin (MEVACOR) 20 MG tablet Take by mouth.    . lovastatin (MEVACOR) 20 MG tablet Take by mouth.    . metoprolol succinate (TOPROL-XL) 50 MG 24 hr tablet Take 50 mg by mouth daily. Take with or immediately following a meal.    . montelukast (SINGULAIR) 10 MG tablet Take 10 mg by mouth at bedtime.    . Multiple Vitamin (MULTI-VITAMINS) TABS Take by mouth.    . Multiple Vitamin (MULTIVITAMIN) tablet Take 1 tablet by mouth daily.    . Probiotic Product (PROBIOTIC PO) Take by mouth.     No current facility-administered medications on file prior to visit.     Allergies  Allergen Reactions  . Amoxicillin   . Bextra [Valdecoxib]   . Ciprofloxacin   . Floxin [Ofloxacin]   . Gyne-Lotrimin [Clotrimazole]   . Levofloxacin   . Penicillin V Potassium Swelling    Caused fever and swelling  .  Quinolones Other (See Comments)  . Septra [Sulfamethoxazole-Trimethoprim] Other (See Comments)    Flushed and fever.  . Sulfa Antibiotics Other (See Comments)    fever  . Penicillins Rash    Social History   Socioeconomic History  . Marital status: Married    Spouse name: Not on file  . Number of children: Not on file  . Years of education: Not on file  . Highest education level: Not on file  Occupational History  . Occupation: [employer]  . Financial resource strain: Not on file  . Food insecurity:    Worry: Not on file    Inability: Not on file  . Transportation needs:    Medical: Not on file    Non-medical: Not on file  Tobacco Use  . Smoking status: Never Smoker  . Smokeless tobacco: Never Used  Substance and Sexual Activity  . Alcohol use: No    Alcohol/week: 0.0 standard drinks  . Drug use: No  . Sexual activity: Yes    Birth control/protection: Post-menopausal  Lifestyle  . Physical activity:    Days per week: Not on file    Minutes per session: Not on file  . Stress: Not on file  Relationships  . Social connections:    Talks on phone: Not on file    Gets together: Not on file    Attends religious service: Not on file    Active member of club or organization: Not on file    Attends meetings of clubs or organizations: Not on file    Relationship status: Not on file  . Intimate partner violence:    Fear of current or ex partner: Not on file    Emotionally abused: Not on file    Physically abused: Not on file    Forced sexual activity: Not on file  Other Topics Concern  . Not on file  Social History Narrative  . Not on file    Family History  Problem Relation Age of Onset  . Cancer Mother   . Hyperlipidemia Mother   . Heart disease Mother   . Hypertension Mother   . Breast cancer Mother 45  . Kidney failure Mother   . Hyperlipidemia Father   . Heart disease Father   . Hypertension Father   . Cancer Father        colon  .  Breast cancer Paternal Aunt 79  . Breast cancer Maternal Aunt 85  .  Kidney cancer Neg Hx   . Bladder Cancer Neg Hx   . Prostate cancer Neg Hx   . Ovarian cancer Neg Hx   . Diabetes Neg Hx    Review of Systems Review of Systems  Constitutional:       Vasomotor symptoms  Eyes: Negative.   Respiratory: Negative.   Cardiovascular: Negative.   Gastrointestinal: Negative.   Genitourinary:       No stress incontinence following physical therapy consultation  Musculoskeletal: Negative.   Skin: Negative.   Neurological: Negative.   Endo/Heme/Allergies: Negative.   Psychiatric/Behavioral: Negative.      Objective:  LMP 07/01/2014 (Exact Date)  BP 112/72   Pulse 98   Ht 5\' 2"  (1.575 m)   Wt 130 lb (59 kg)   LMP 07/01/2014 (Exact Date)   BMI 23.78 kg/m   CONSTITUTIONAL: Well-developed, well-nourished female in no acute distress.  PSYCHIATRIC: Normal mood and affect. Normal behavior. Normal judgment and thought content. [place]: Alert and oriented to person, place, and time. Normal muscle tone coordination. No cranial nerve deficit noted. HENT:  Normocephalic, atraumatic, External right and left ear normal.  EYES: Conjunctivae and EOM are normal. . No scleral icterus.  NECK: Normal range of motion, supple, no masses.  Normal thyroid.  SKIN: Skin is warm and dry. No rash noted. Not diaphoretic. No erythema. No pallor. CARDIOVASCULAR: Normal heart rate noted, regular rhythm, no murmur. RESPIRATORY: Clear to auscultation bilaterally. Effort and breath sounds normal, no problems with respiration noted. BREASTS: Symmetric in size. No masses, skin changes, nipple drainage, or lymphadenopathy. ABDOMEN: Soft, normal bowel sounds, no distention noted.  No tenderness, rebound or guarding.  BLADDER: Normal PELVIC:  External Genitalia: Normal  BUS: Normal  Vagina: Moderate vaginal atrophy; second-degree cystourethrocele  Cervix: Normal  Uterus: Normal; midplane, normal size and shape,  mobile, nontender  Adnexa: Normal  RV: External Exam NormaI, No Rectal Masses and Normal Sphincter tone  MUSCULOSKELETAL: Normal range of motion. No tenderness.  No cyanosis, clubbing, or edema.  2+ distal pulses. LYMPHATIC: No Axillary, Supraclavicular, or Inguinal Adenopathy.    Assessment:   Annual gynecologic examination 57 y.o. Contraception: post menopausal status Normal BMI Grade 2 cystocele Stress urinary incontinence, Minimal; status post physical therapy consultation with improvement in symptomatology History of recurrent UTI. Vaginal atrophy, mildly symptomatic; does not desire systemic HRT Family history of breast cancer-mom  Plan:  Pap:due 2021 Mammogram: Ordered Stool Guaiac Testing:  Colonoscopy due this year- 05/2018.  Appointment already scheduled with Dawson Bills at [facility] Labs:thru pcp Routine preventative health maintenance measures emphasized: Exercise/Diet/Weight control, Tobacco Warnings and Alcohol/Substance use risks  Trial of Premarin cream intravaginal 1/2 g twice weekly Continue with vitamin D supplementation.  Consider taking Tums as calcium supplementation Return to [place], [place]  Brayton Mars, MD  Note: This dictation was prepared with Dragon dictation along with smaller phrase technology. Any transcriptional errors that result from this process are unintentional.

## 2018-04-19 ENCOUNTER — Ambulatory Visit (INDEPENDENT_AMBULATORY_CARE_PROVIDER_SITE_OTHER): Payer: 59 | Admitting: Obstetrics and Gynecology

## 2018-04-19 ENCOUNTER — Encounter: Payer: Self-pay | Admitting: Obstetrics and Gynecology

## 2018-04-19 VITALS — BP 112/72 | HR 98 | Ht 62.0 in | Wt 130.0 lb

## 2018-04-19 DIAGNOSIS — Z01419 Encounter for gynecological examination (general) (routine) without abnormal findings: Secondary | ICD-10-CM | POA: Diagnosis not present

## 2018-04-19 DIAGNOSIS — N8111 Cystocele, midline: Secondary | ICD-10-CM | POA: Diagnosis not present

## 2018-04-19 DIAGNOSIS — Z1211 Encounter for screening for malignant neoplasm of colon: Secondary | ICD-10-CM | POA: Diagnosis not present

## 2018-04-19 DIAGNOSIS — Z803 Family history of malignant neoplasm of breast: Secondary | ICD-10-CM | POA: Diagnosis not present

## 2018-04-19 DIAGNOSIS — N809 Endometriosis, unspecified: Secondary | ICD-10-CM | POA: Diagnosis not present

## 2018-04-19 DIAGNOSIS — N952 Postmenopausal atrophic vaginitis: Secondary | ICD-10-CM

## 2018-04-19 DIAGNOSIS — Z1231 Encounter for screening mammogram for malignant neoplasm of breast: Secondary | ICD-10-CM | POA: Diagnosis not present

## 2018-04-19 DIAGNOSIS — N393 Stress incontinence (female) (male): Secondary | ICD-10-CM | POA: Diagnosis not present

## 2018-04-19 NOTE — Patient Instructions (Addendum)
1.  Pap smear is not done.  Next Pap smear is due in 2021. 2.  Mammogram is ordered. 3.  Screening colonoscopy is scheduled. 4.  Routine labs are to be obtained through primary care. 5.  Continue with healthy eating and exercise. 6.  Recommend calcium 600 mg twice a day and vitamin D 400 international units twice a day. 7.  Return in 1 year for annual exam 8.  Trial of Premarin cream 1/2 g intravaginal twice a week   Health Maintenance for Postmenopausal Women Menopause is a normal process in which your reproductive ability comes to an end. This process happens gradually over a span of months to years, usually between the ages of 52 and 68. Menopause is complete when you have missed 12 consecutive menstrual periods. It is important to talk with your health care provider about some of the most common conditions that affect postmenopausal women, such as heart disease, cancer, and bone loss (osteoporosis). Adopting a healthy lifestyle and getting preventive care can help to promote your health and wellness. Those actions can also lower your chances of developing some of these common conditions. What should I know about menopause? During menopause, you may experience a number of symptoms, such as:  Moderate-to-severe hot flashes.  Night sweats.  Decrease in sex drive.  Mood swings.  Headaches.  Tiredness.  Irritability.  Memory problems.  Insomnia.  Choosing to treat or not to treat menopausal changes is an individual decision that you make with your health care provider. What should I know about hormone replacement therapy and supplements? Hormone therapy products are effective for treating symptoms that are associated with menopause, such as hot flashes and night sweats. Hormone replacement carries certain risks, especially as you become older. If you are thinking about using estrogen or estrogen with progestin treatments, discuss the benefits and risks with your health care  provider. What should I know about heart disease and stroke? Heart disease, heart attack, and stroke become more likely as you age. This may be due, in part, to the hormonal changes that your body experiences during menopause. These can affect how your body processes dietary fats, triglycerides, and cholesterol. Heart attack and stroke are both medical emergencies. There are many things that you can do to help prevent heart disease and stroke:  Have your blood pressure checked at least every 1-2 years. High blood pressure causes heart disease and increases the risk of stroke.  If you are 23-33 years old, ask your health care provider if you should take aspirin to prevent a heart attack or a stroke.  Do not use any tobacco products, including cigarettes, chewing tobacco, or electronic cigarettes. If you need help quitting, ask your health care provider.  It is important to eat a healthy diet and maintain a healthy weight. ? Be sure to include plenty of vegetables, fruits, low-fat dairy products, and lean protein. ? Avoid eating foods that are high in solid fats, added sugars, or salt (sodium).  Get regular exercise. This is one of the most important things that you can do for your health. ? Try to exercise for at least 150 minutes each week. The type of exercise that you do should increase your heart rate and make you sweat. This is known as moderate-intensity exercise. ? Try to do strengthening exercises at least twice each week. Do these in addition to the moderate-intensity exercise.  Know your numbers.Ask your health care provider to check your cholesterol and your blood glucose. Continue to  have your blood tested as directed by your health care provider.  What should I know about cancer screening? There are several types of cancer. Take the following steps to reduce your risk and to catch any cancer development as early as possible. Breast Cancer  Practice breast self-awareness. ? This  means understanding how your breasts normally appear and feel. ? It also means doing regular breast self-exams. Let your health care provider know about any changes, no matter how small.  If you are 61 or older, have a clinician do a breast exam (clinical breast exam or CBE) every year. Depending on your age, family history, and medical history, it may be recommended that you also have a yearly breast X-ray (mammogram).  If you have a family history of breast cancer, talk with your health care provider about genetic screening.  If you are at high risk for breast cancer, talk with your health care provider about having an MRI and a mammogram every year.  Breast cancer (BRCA) gene test is recommended for women who have family members with BRCA-related cancers. Results of the assessment will determine the need for genetic counseling and BRCA1 and for BRCA2 testing. BRCA-related cancers include these types: ? Breast. This occurs in males or females. ? Ovarian. ? Tubal. This may also be called fallopian tube cancer. ? Cancer of the abdominal or pelvic lining (peritoneal cancer). ? Prostate. ? Pancreatic.  Cervical, Uterine, and Ovarian Cancer Your health care provider may recommend that you be screened regularly for cancer of the pelvic organs. These include your ovaries, uterus, and vagina. This screening involves a pelvic exam, which includes checking for microscopic changes to the surface of your cervix (Pap test).  For women ages 21-65, health care providers may recommend a pelvic exam and a Pap test every three years. For women ages 73-65, they may recommend the Pap test and pelvic exam, combined with testing for human papilloma virus (HPV), every five years. Some types of HPV increase your risk of cervical cancer. Testing for HPV may also be done on women of any age who have unclear Pap test results.  Other health care providers may not recommend any screening for nonpregnant women who are  considered low risk for pelvic cancer and have no symptoms. Ask your health care provider if a screening pelvic exam is right for you.  If you have had past treatment for cervical cancer or a condition that could lead to cancer, you need Pap tests and screening for cancer for at least 20 years after your treatment. If Pap tests have been discontinued for you, your risk factors (such as having a new sexual partner) need to be reassessed to determine if you should start having screenings again. Some women have medical problems that increase the chance of getting cervical cancer. In these cases, your health care provider may recommend that you have screening and Pap tests more often.  If you have a family history of uterine cancer or ovarian cancer, talk with your health care provider about genetic screening.  If you have vaginal bleeding after reaching menopause, tell your health care provider.  There are currently no reliable tests available to screen for ovarian cancer.  Lung Cancer Lung cancer screening is recommended for adults 74-37 years old who are at high risk for lung cancer because of a history of smoking. A yearly low-dose CT scan of the lungs is recommended if you:  Currently smoke.  Have a history of at least 30 pack-years  of smoking and you currently smoke or have quit within the past 15 years. A pack-year is smoking an average of one pack of cigarettes per day for one year.  Yearly screening should:  Continue until it has been 15 years since you quit.  Stop if you develop a health problem that would prevent you from having lung cancer treatment.  Colorectal Cancer  This type of cancer can be detected and can often be prevented.  Routine colorectal cancer screening usually begins at age 39 and continues through age 9.  If you have risk factors for colon cancer, your health care provider may recommend that you be screened at an earlier age.  If you have a family history of  colorectal cancer, talk with your health care provider about genetic screening.  Your health care provider may also recommend using home test kits to check for hidden blood in your stool.  A small camera at the end of a tube can be used to examine your colon directly (sigmoidoscopy or colonoscopy). This is done to check for the earliest forms of colorectal cancer.  Direct examination of the colon should be repeated every 5-10 years until age 89. However, if early forms of precancerous polyps or small growths are found or if you have a family history or genetic risk for colorectal cancer, you may need to be screened more often.  Skin Cancer  Check your skin from head to toe regularly.  Monitor any moles. Be sure to tell your health care provider: ? About any new moles or changes in moles, especially if there is a change in a mole's shape or color. ? If you have a mole that is larger than the size of a pencil eraser.  If any of your family members has a history of skin cancer, especially at a young age, talk with your health care provider about genetic screening.  Always use sunscreen. Apply sunscreen liberally and repeatedly throughout the day.  Whenever you are outside, protect yourself by wearing long sleeves, pants, a wide-brimmed hat, and sunglasses.  What should I know about osteoporosis? Osteoporosis is a condition in which bone destruction happens more quickly than new bone creation. After menopause, you may be at an increased risk for osteoporosis. To help prevent osteoporosis or the bone fractures that can happen because of osteoporosis, the following is recommended:  If you are 86-41 years old, get at least 1,000 mg of calcium and at least 600 mg of vitamin D per day.  If you are older than age 45 but younger than age 50, get at least 1,200 mg of calcium and at least 600 mg of vitamin D per day.  If you are older than age 57, get at least 1,200 mg of calcium and at least 800 mg  of vitamin D per day.  Smoking and excessive alcohol intake increase the risk of osteoporosis. Eat foods that are rich in calcium and vitamin D, and do weight-bearing exercises several times each week as directed by your health care provider. What should I know about how menopause affects my mental health? Depression may occur at any age, but it is more common as you become older. Common symptoms of depression include:  Low or sad mood.  Changes in sleep patterns.  Changes in appetite or eating patterns.  Feeling an overall lack of motivation or enjoyment of activities that you previously enjoyed.  Frequent crying spells.  Talk with your health care provider if you think that you  are experiencing depression. What should I know about immunizations? It is important that you get and maintain your immunizations. These include:  Tetanus, diphtheria, and pertussis (Tdap) booster vaccine.  Influenza every year before the flu season begins.  Pneumonia vaccine.  Shingles vaccine.  Your health care provider may also recommend other immunizations. This information is not intended to replace advice given to you by your health care provider. Make sure you discuss any questions you have with your health care provider. Document Released: 10/01/2005 Document Revised: 02/27/2016 Document Reviewed: 05/13/2015 Elsevier Interactive Patient Education  2018 [employer].

## 2018-04-27 DIAGNOSIS — J301 Allergic rhinitis due to pollen: Secondary | ICD-10-CM | POA: Diagnosis not present

## 2018-04-27 DIAGNOSIS — H722X9 Other marginal perforations of tympanic membrane, unspecified ear: Secondary | ICD-10-CM | POA: Diagnosis not present

## 2018-04-27 DIAGNOSIS — H6980 Other specified disorders of Eustachian tube, unspecified ear: Secondary | ICD-10-CM | POA: Diagnosis not present

## 2018-05-05 ENCOUNTER — Other Ambulatory Visit: Payer: Self-pay

## 2018-05-05 MED ORDER — ESTROGENS, CONJUGATED 0.625 MG/GM VA CREA: 0.5000 | TOPICAL_CREAM | VAGINAL | 6 refills | Status: DC

## 2018-05-09 DIAGNOSIS — Z860100 Personal history of colon polyps, unspecified: Secondary | ICD-10-CM | POA: Insufficient documentation

## 2018-05-10 ENCOUNTER — Ambulatory Visit
Admission: RE | Admit: 2018-05-10 | Discharge: 2018-05-10 | Disposition: A | Discharge status: Home / Self Care | Payer: 59 | Source: Ambulatory Visit | Attending: Obstetrics and Gynecology | Admitting: Obstetrics and Gynecology

## 2018-05-10 DIAGNOSIS — Z803 Family history of malignant neoplasm of breast: Secondary | ICD-10-CM | POA: Diagnosis not present

## 2018-05-10 DIAGNOSIS — Z1231 Encounter for screening mammogram for malignant neoplasm of breast: Secondary | ICD-10-CM | POA: Diagnosis not present

## 2018-05-11 ENCOUNTER — Encounter: Payer: Self-pay | Admitting: *Deleted

## 2018-05-11 ENCOUNTER — Other Ambulatory Visit: Payer: Self-pay

## 2018-05-11 NOTE — Discharge Instructions (Signed)
General Anesthesia, Adult, Care After These instructions provide you with information about caring for yourself after your procedure. Your health care provider may also give you more specific instructions. Your treatment has been planned according to current medical practices, but problems sometimes occur. Call your health care provider if you have any problems or questions after your procedure. What can I expect after the procedure? After the procedure, it is common to have:  Vomiting.  A sore throat.  Mental slowness.  It is common to feel:  Nauseous.  Cold or shivery.  Sleepy.  Tired.  Sore or achy, even in parts of your body where you did not have surgery.  Follow these instructions at home: For at least 24 hours after the procedure:  Do not: ? Participate in activities where you could fall or become injured. ? Drive. ? Use heavy machinery. ? Drink alcohol. ? Take sleeping pills or medicines that cause drowsiness. ? Make important decisions or sign legal documents. ? Take care of children on your own.  Rest. Eating and drinking  If you vomit, drink water, juice, or soup when you can drink without vomiting.  Drink enough fluid to keep your urine clear or pale yellow.  Make sure you have little or no nausea before eating solid foods.  Follow the diet recommended by your health care provider. General instructions  Have a responsible adult stay with you until you are awake and alert.  Return to your normal activities as told by your health care provider. Ask your health care provider what activities are safe for you.  Take over-the-counter and prescription medicines only as told by your health care provider.  If you smoke, do not smoke without supervision.  Keep all follow-up visits as told by your health care provider. This is important. Contact a health care provider if:  You continue to have nausea or vomiting at home, and medicines are not helpful.  You  cannot drink fluids or start eating again.  You cannot urinate after 8-12 hours.  You develop a skin rash.  You have fever.  You have increasing redness at the site of your procedure. Get help right away if:  You have difficulty breathing.  You have chest pain.  You have unexpected bleeding.  You feel that you are having a life-threatening or urgent problem. This information is not intended to replace advice given to you by your health care provider. Make sure you discuss any questions you have with your health care provider. Document Released: 11/15/2000 Document Revised: 01/12/2016 Document Reviewed: 07/24/2015 Elsevier Interactive Patient Education  2018 [place] ENDOSCOPIC SINUS SURGERY [facility]  What is Functional Endoscopic Sinus Surgery?  The Surgery involves making the natural openings of the sinuses larger by removing the bony partitions that separate the sinuses from the nasal cavity.  The natural sinus lining is preserved as much as possible to allow the sinuses to resume normal function after the surgery.  In some patients nasal polyps (excessively swollen lining of the sinuses) may be removed to relieve obstruction of the sinus openings.  The surgery is performed through the nose using lighted scopes, which eliminates the need for incisions on the face.  A septoplasty is a different procedure which is sometimes performed with sinus surgery.  It involves straightening the boy partition that separates the two sides of your nose.  A crooked or deviated septum may need repair if is  obstructing the sinuses or nasal airflow.  Turbinate reduction is also often performed during sinus surgery.  The turbinates are bony proturberances from the side walls of the nose which swell and can obstruct the nose in patients with sinus and allergy problems.  Their size can be surgically reduced to help  relieve nasal obstruction.  What Can Sinus Surgery Do For Me?  Sinus surgery can reduce the frequency of sinus infections requiring antibiotic treatment.  This can provide improvement in nasal congestion, post-nasal drainage, facial pressure and nasal obstruction.  Surgery will NOT prevent you from ever having an infection again, so it usually only for patients who get infections 4 or more times yearly requiring antibiotics, or for infections that do not clear with antibiotics.  It will not cure nasal allergies, so patients with allergies may still require medication to treat their allergies after surgery. Surgery may improve headaches related to sinusitis, however, some people will continue to require medication to control sinus headaches related to allergies.  Surgery will do nothing for other forms of headache (migraine, tension or cluster).  What Are the Risks of Endoscopic Sinus Surgery?  Current techniques allow surgery to be performed safely with little risk, however, there are rare complications that patients should be aware of.  Because the sinuses are located around the eyes, there is risk of eye injury, including blindness, though again, this would be quite rare. This is usually a result of bleeding behind the eye during surgery, which puts the vision oat risk, though there are treatments to protect the vision and prevent permanent disrupted by surgery causing a leak of the spinal fluid that surrounds the brain.  More serious complications would include bleeding inside the brain cavity or damage to the brain.  Again, all of these complications are uncommon, and spinal fluid leaks can be safely managed surgically if they occur.  The most common complication of sinus surgery is bleeding from the nose, which may require packing or cauterization of the nose.  Continued sinus have polyps may experience recurrence of the polyps requiring revision surgery.  Alterations of sense of smell or injury to the  tear ducts are also rare complications.   What is the Surgery Like, and what is the Recovery?  The Surgery usually takes a couple of hours to perform, and is usually performed under a general anesthetic (completely asleep).  Patients are usually discharged home after a couple of hours.  Sometimes during surgery it is necessary to pack the nose to control bleeding, and the packing is left in place for 24 - 48 hours, and removed by your surgeon.  If a septoplasty was performed during the procedure, there is often a splint placed which must be removed after 5-7 days.   Discomfort: Pain is usually mild to moderate, and can be controlled by prescription pain medication or acetaminophen (Tylenol).  Aspirin, Ibuprofen (Advil, Motrin), or Naprosyn (Aleve) should be avoided, as they can cause increased bleeding.  Most patients feel sinus pressure like they have a bad head cold for several days.  Sleeping with your head elevated can help reduce swelling and facial pressure, as can ice packs over the face.  A humidifier may be helpful to keep the mucous and blood from drying in the nose.   Diet: There are no specific diet restrictions, however, you should generally start with clear liquids and a light diet of bland foods because the anesthetic can cause some nausea.  Advance your diet depending on how your  stomach feels.  Taking your pain medication with food will often help reduce stomach upset which pain medications can cause.  Nasal Saline Irrigation: It is important to remove blood clots and dried mucous from the nose as it is healing.  This is done by having you irrigate the nose at least 3 - 4 times daily with a salt water solution.  We recommend using NeilMed Sinus Rinse (available at the drug store).  Fill the squeeze bottle with the solution, bend over a sink, and insert the tip of the squeeze bottle into the nose  of an inch.  Point the tip of the squeeze bottle towards the inside corner of the eye on the  same side your irrigating.  Squeeze the bottle and gently irrigate the nose.  If you bend forward as you do this, most of the fluid will flow back out of the nose, instead of down your throat.   The solution should be warm, near body temperature, when you irrigate.   Each time you irrigate, you should use a full squeeze bottle.   Note that if you are instructed to use Nasal Steroid Sprays at any time after your surgery, irrigate with saline BEFORE using the steroid spray, so you do not wash it all out of the nose. Another product, Nasal Saline Gel (such as AYR Nasal Saline Gel) can be applied in each nostril 3 - 4 times daily to moisture the nose and reduce scabbing or crusting.  Bleeding:  Bloody drainage from the nose can be expected for several days, and patients are instructed to irrigate their nose frequently with salt water to help remove mucous and blood clots.  The drainage may be dark red or brown, though some fresh blood may be seen intermittently, especially after irrigation.  Do not blow you nose, as bleeding may occur. If you must sneeze, keep your mouth open to allow air to escape through your mouth.  If heavy bleeding occurs: Irrigate the nose with saline to rinse out clots, then spray the nose 3 - 4 times with Afrin Nasal Decongestant Spray.  The spray will constrict the blood vessels to slow bleeding.  Pinch the lower half of your nose shut to apply pressure, and lay down with your head elevated.  Ice packs over the nose may help as well. If bleeding persists despite these measures, you should notify your doctor.  Do not use the Afrin routinely to control nasal congestion after surgery, as it can result in worsening congestion and may affect healing.     Activity: Return to work varies among patients. Most patients will be out of work at least 5 - 7 days to recover.  Patient may return to work after they are off of narcotic pain medication, and feeling well enough to perform the functions  of their job.  Patients must avoid heavy lifting (over 10 pounds) or strenuous physical for 2 weeks after surgery, so your employer may need to assign you to light duty, or keep you out of work longer if light duty is not possible.  NOTE: you should not drive, operate dangerous machinery, do any mentally demanding tasks or make any important legal or financial decisions while on narcotic pain medication and recovering from the general anesthetic.    Call Your Doctor Immediately if You Have Any of the Following: 1. Bleeding that you cannot control with the above measures 2. Loss of vision, double vision, bulging of the eye or black eyes. 3. Fever over  101 degrees 4. Neck stiffness with severe headache, fever, nausea and change in mental state. You are always encourage to call anytime with concerns, however, please call with requests for pain medication refills during office hours.  Office Endoscopy: During follow-up visits your doctor will remove any packing or splints that may have been placed and evaluate and clean your sinuses endoscopically.  Topical anesthetic will be used to make this as comfortable as possible, though you may want to take your pain medication prior to the visit.  How often this will need to be done varies from patient to patient.  After complete recovery from the surgery, you may need follow-up endoscopy from time to time, particularly if there is concern of recurrent infection or nasal polyps.

## 2018-05-18 ENCOUNTER — Ambulatory Visit: Payer: 59 | Admitting: Anesthesiology

## 2018-05-18 ENCOUNTER — Ambulatory Visit
Admission: RE | Admit: 2018-05-18 | Discharge: 2018-05-18 | Disposition: A | Discharge status: Home / Self Care | Payer: 59 | Source: Ambulatory Visit | Attending: Otolaryngology | Admitting: Otolaryngology

## 2018-05-18 ENCOUNTER — Encounter
Admission: RE | Disposition: A | Discharge status: Home / Self Care | Payer: Self-pay | Source: Ambulatory Visit | Attending: Otolaryngology

## 2018-05-18 DIAGNOSIS — J3489 Other specified disorders of nose and nasal sinuses: Secondary | ICD-10-CM | POA: Insufficient documentation

## 2018-05-18 DIAGNOSIS — J45909 Unspecified asthma, uncomplicated: Secondary | ICD-10-CM | POA: Insufficient documentation

## 2018-05-18 DIAGNOSIS — J343 Hypertrophy of nasal turbinates: Secondary | ICD-10-CM | POA: Diagnosis not present

## 2018-05-18 DIAGNOSIS — E0590 Thyrotoxicosis, unspecified without thyrotoxic crisis or storm: Secondary | ICD-10-CM | POA: Diagnosis not present

## 2018-05-18 DIAGNOSIS — H9193 Unspecified hearing loss, bilateral: Secondary | ICD-10-CM | POA: Insufficient documentation

## 2018-05-18 DIAGNOSIS — M1990 Unspecified osteoarthritis, unspecified site: Secondary | ICD-10-CM | POA: Diagnosis not present

## 2018-05-18 DIAGNOSIS — H6983 Other specified disorders of Eustachian tube, bilateral: Secondary | ICD-10-CM | POA: Diagnosis not present

## 2018-05-18 DIAGNOSIS — Z79899 Other long term (current) drug therapy: Secondary | ICD-10-CM | POA: Diagnosis not present

## 2018-05-18 HISTORY — DX: Scoliosis, unspecified: M41.9

## 2018-05-18 HISTORY — DX: Unspecified osteoarthritis, unspecified site: M19.90

## 2018-05-18 HISTORY — DX: Nausea with vomiting, unspecified: R11.2

## 2018-05-18 HISTORY — DX: Presence of spectacles and contact lenses: Z97.3

## 2018-05-18 HISTORY — DX: Other specified postprocedural states: Z98.890

## 2018-05-18 HISTORY — DX: Nausea with vomiting, unspecified: Z98.890

## 2018-05-18 HISTORY — PX: NASOPHARYNGOSCOPY EUSTATION TUBE BALLOON DILATION: SHX6729

## 2018-05-18 SURGERY — NASOPHARYNGOSCOPY, WITH EUSTACHIAN TUBE DILATION USING BALLOON
Anesthesia: General | Site: Nose | Laterality: Bilateral

## 2018-05-18 MED ORDER — METOPROLOL TARTRATE 5 MG/5ML IV SOLN
5.0000 mg | Freq: Once | INTRAVENOUS | Status: AC
  Administered 2018-05-18: 5 mg via INTRAVENOUS

## 2018-05-18 MED ORDER — PROMETHAZINE HCL 25 MG/ML IJ SOLN: 6.2500 mg | INTRAMUSCULAR | Status: DC | PRN

## 2018-05-18 MED ORDER — PHENYLEPHRINE HCL 0.5 % NA SOLN
NASAL | Status: DC | PRN
  Administered 2018-05-18: 30 mL via TOPICAL

## 2018-05-18 MED ORDER — SUCCINYLCHOLINE CHLORIDE 20 MG/ML IJ SOLN
INTRAMUSCULAR | Status: DC | PRN
  Administered 2018-05-18: 100 mg via INTRAVENOUS

## 2018-05-18 MED ORDER — FENTANYL CITRATE (PF) 100 MCG/2ML IJ SOLN
INTRAMUSCULAR | Status: DC | PRN
  Administered 2018-05-18: 50 ug via INTRAVENOUS

## 2018-05-18 MED ORDER — OXYMETAZOLINE HCL 0.05 % NA SOLN
2.0000 | Freq: Once | NASAL | Status: AC
  Administered 2018-05-18: 2 via NASAL

## 2018-05-18 MED ORDER — GLYCOPYRROLATE 0.2 MG/ML IJ SOLN
INTRAMUSCULAR | Status: DC | PRN
  Administered 2018-05-18: 0.1 mg via INTRAVENOUS

## 2018-05-18 MED ORDER — ONDANSETRON HCL 4 MG/2ML IJ SOLN
INTRAMUSCULAR | Status: DC | PRN
  Administered 2018-05-18: 4 mg via INTRAVENOUS

## 2018-05-18 MED ORDER — MIDAZOLAM HCL 5 MG/5ML IJ SOLN
INTRAMUSCULAR | Status: DC | PRN
  Administered 2018-05-18: 2 mg via INTRAVENOUS

## 2018-05-18 MED ORDER — OXYCODONE HCL 5 MG PO TABS: 5.0000 mg | ORAL_TABLET | Freq: Once | ORAL | Status: DC | PRN

## 2018-05-18 MED ORDER — OXYCODONE HCL 5 MG/5ML PO SOLN: 5.0000 mg | Freq: Once | ORAL | Status: DC | PRN

## 2018-05-18 MED ORDER — PROPOFOL 10 MG/ML IV BOLUS
INTRAVENOUS | Status: DC | PRN
  Administered 2018-05-18: 150 mg via INTRAVENOUS

## 2018-05-18 MED ORDER — DEXAMETHASONE SODIUM PHOSPHATE 4 MG/ML IJ SOLN
INTRAMUSCULAR | Status: DC | PRN
  Administered 2018-05-18: 10 mg via INTRAVENOUS

## 2018-05-18 MED ORDER — FENTANYL CITRATE (PF) 100 MCG/2ML IJ SOLN: 25.0000 ug | INTRAMUSCULAR | Status: DC | PRN

## 2018-05-18 MED ORDER — LIDOCAINE HCL (CARDIAC) PF 100 MG/5ML IV SOSY
PREFILLED_SYRINGE | INTRAVENOUS | Status: DC | PRN
  Administered 2018-05-18: 40 mg via INTRAVENOUS

## 2018-05-18 MED ORDER — LACTATED RINGERS IV SOLN
INTRAVENOUS | Status: DC
  Administered 2018-05-18: 08:00:00 via INTRAVENOUS

## 2018-05-18 SURGICAL SUPPLY — 18 items
BALLN CATH EUST TUBE 6X16 (BALLOONS) ×3
CANISTER SUCT 1200ML W/VALVE (MISCELLANEOUS) ×3 IMPLANT
CATH BALLOON EUST TUBE 6X16 (BALLOONS) ×1 IMPLANT
COAGULATOR SUCT 8FR VV (MISCELLANEOUS) ×2 IMPLANT
DEVICE INFLATION SEID (MISCELLANEOUS) ×3 IMPLANT
DRAPE HEAD BAR (DRAPES) ×2 IMPLANT
ELECT REM PT RETURN 9FT ADLT (ELECTROSURGICAL) ×3
ELECTRODE REM PT RTRN 9FT ADLT (ELECTROSURGICAL) IMPLANT
GLOVE PI ULTRA LF STRL 7.5 (GLOVE) ×1 IMPLANT
GLOVE PI ULTRA NON LATEX 7.5 (GLOVE) ×4
NDL SPNL 25GX3.5 QUINCKE BL (NEEDLE) IMPLANT
NEEDLE SPNL 25GX3.5 QUINCKE BL (NEEDLE) ×3 IMPLANT
NS IRRIG 500ML POUR BTL (IV SOLUTION) ×3 IMPLANT
PACK ENT CUSTOM (PACKS) ×3 IMPLANT
PATTIES SURGICAL .5 X3 (DISPOSABLE) ×3 IMPLANT
SOL ANTI-FOG 6CC FOG-OUT (MISCELLANEOUS) ×1 IMPLANT
SOL FOG-OUT ANTI-FOG 6CC (MISCELLANEOUS) ×2
SYR 3ML LL SCALE MARK (SYRINGE) ×2 IMPLANT

## 2018-05-18 NOTE — Transfer of Care (Signed)
Immediate Anesthesia Transfer of Care Note  Patient: [name]  Procedure(s) Performed: NASOPHARYNGOSCOPY EUSTATION TUBE BALLOON DILATION OUTFRACTURE BILATERAL INFERIOR TURBINATES (Bilateral Nose)  Patient Location: PACU  Anesthesia Type: General  Level of Consciousness: awake, alert  and patient cooperative  Airway and Oxygen Therapy: Patient Spontanous Breathing and Patient connected to supplemental oxygen  Post-op Assessment: Post-op Vital signs reviewed, Patient's Cardiovascular Status Stable, Respiratory Function Stable, Patent Airway and No signs of Nausea or vomiting  Post-op Vital Signs: Reviewed and stable  Complications: No apparent anesthesia complications

## 2018-05-18 NOTE — H&P (Signed)
H&P has been reviewedand patient reevaluated,  and no changes necessary. To be downloaded later.  

## 2018-05-18 NOTE — Anesthesia Preprocedure Evaluation (Addendum)
Anesthesia Evaluation    History of Anesthesia Complications (+) PONV  Airway Mallampati: II  TM Distance: >3 FB Neck ROM: Full    Dental no notable dental hx.    Pulmonary asthma ,    Pulmonary exam normal breath sounds clear to auscultation       Cardiovascular Normal cardiovascular exam Rhythm:Regular Rate:Normal  SVT.  No recent symptoms. No medication changes at last cardiologist appointment. .  Pt works out vigorously 45 mins per day.    Neuro/Psych    GI/Hepatic   Endo/Other  Hyperthyroidism   Renal/GU      Musculoskeletal  (+) Arthritis ,   Abdominal   Peds  Hematology   Anesthesia Other Findings   Reproductive/Obstetrics                            Anesthesia Physical Anesthesia Plan  ASA: II  Anesthesia Plan: General   Post-op Pain Management:    Induction: Intravenous  PONV Risk Score and Plan:   Airway Management Planned: LMA  Additional Equipment:   Intra-op Plan:   Post-operative Plan: Extubation in OR  Informed Consent: I have reviewed the patients History and Physical, chart, labs and discussed the procedure including the risks, benefits and alternatives for the proposed anesthesia with the patient or authorized representative who has indicated his/her understanding and acceptance.   Dental advisory given  Plan Discussed with: CRNA  Anesthesia Plan Comments:         Anesthesia Quick Evaluation

## 2018-05-18 NOTE — Anesthesia Procedure Notes (Signed)
Procedure Name: Intubation Date/Time: 05/18/2018 8:42 AM Performed by: Mayme Genta, CRNA Pre-anesthesia Checklist: Patient identified, Emergency Drugs available, Suction available, Patient being monitored and Timeout performed Patient Re-evaluated:Patient Re-evaluated prior to induction Oxygen Delivery Method: Circle system utilized Preoxygenation: Pre-oxygenation with 100% oxygen Induction Type: IV induction Ventilation: Mask ventilation without difficulty Laryngoscope Size: Miller and 2 Grade View: Grade I Tube type: Oral Rae Tube size: 7.0 mm Number of attempts: 1 Placement Confirmation: ETT inserted through vocal cords under direct vision,  positive ETCO2 and breath sounds checked- equal and bilateral Tube secured with: Tape Dental Injury: Teeth and Oropharynx as per pre-operative assessment

## 2018-05-18 NOTE — Anesthesia Postprocedure Evaluation (Signed)
Anesthesia Post Note  Patient: Laurine Kuyper Bills  Procedure(s) Performed: NASOPHARYNGOSCOPY EUSTATION TUBE BALLOON DILATION OUTFRACTURE BILATERAL INFERIOR TURBINATES (Bilateral Nose)  Patient location during evaluation: PACU Anesthesia Type: General Level of consciousness: awake and alert Pain management: pain level controlled Vital Signs Assessment: post-procedure vital signs reviewed and stable Respiratory status: spontaneous breathing, nonlabored ventilation, respiratory function stable and patient connected to nasal cannula oxygen Cardiovascular status: blood pressure returned to baseline and stable Postop Assessment: no apparent nausea or vomiting Anesthetic complications: no    Taggert Bozzi C

## 2018-05-18 NOTE — Op Note (Signed)
05/18/2018  9:10 AM    Gerry, Freda Munro  [id number]   Pre-Op Dx: Chronic eustachian tube dysfunction, nasal obstruction, hypertrophied inferior turbinates  Post-op Dx: Same  Proc: Nasopharyngoscopy, outfracture inferior turbinates bilaterally, balloon dilation of the eustachian tubes bilaterally  Surg:  Elon Alas Jacynda Brunke  Anes:  GOT  EBL: Minimal  Comp: None  Findings: Large inferior turbinates that obstructed part of the lower airway.  Once they were outfractured the lower airway was much straighter and open.  The balloon fit well into the eustachian tube opening on both sides.   Procedure: The patient was brought to the operating room and given general anesthesia by oral endotracheal intubation.  Once the patient was asleep the nose was prepped by packing it with with cotton pledgets soaked in phenylephrine and Xylocaine.  She was prepped and draped in sterile fashion.  The cotton pledgets removed the 0 degrees scope was used to visualize the nasal airway on both sides.  Enlarged turbinates on both sides.  These were outfractured to create a little more room.  There is minimal irritation mucosa on the surface that led to minimal bleeding.  The 30 degrees scope was then used to pass through the nose to visualize the nasopharynx.  This was done on the left side first.  The balloon dilator was passed through the nose and placed into the opening of the torus tubarius.  The balloon was then placed up into the eustachian tube until it was fully deployed and in the eustachian tube as far as it would go.  The balloon was then dilated up to 12 cm of pressure slowly.  The pressure was left there for 3 minutes and then the pressure was taken down.  The balloon was then removed from the eustachian tube.  The opening looked larger.  There is no sign of any bleeding from the eustachian tube.  This procedure was then repeated on the right side using the 0 degrees scope visualized turbinate and  outfracturing it with a Arts development officer.  The 30 degrees scope was then used for visualizing the nasopharynx.  The balloon dilator was placed down through the right nasal passage of the nasopharynx.  The scope was used to visualize the opening to the eustachian tube and the balloon was passed up into the eustachian tube until it was completely in the eustachian tube.  The balloon was then dilated to 12 cm and kept inflated for 3 minutes.  Was then deflated and removed.  There is no bleeding.  The airway is look more open on both sides of the scope.  Rest the nasopharynx was very clear without any growths or masses.  Patient was awakened and taken to the recovery room in satisfactory condition.  There were no operative complications.  Dispo:   To PACU to be discharged home  Plan: To follow-up in the office in a couple weeks and redo tympanograms.  Can eat normally at home and go back to work tomorrow.  She will let me know if she has any problems  Huey Romans  05/18/2018 9:10 AM

## 2018-05-19 ENCOUNTER — Encounter: Payer: Self-pay | Admitting: Otolaryngology

## 2018-05-19 DIAGNOSIS — Z8639 Personal history of other endocrine, nutritional and metabolic disease: Secondary | ICD-10-CM | POA: Diagnosis not present

## 2018-05-19 DIAGNOSIS — R7303 Prediabetes: Secondary | ICD-10-CM | POA: Diagnosis not present

## 2018-05-19 DIAGNOSIS — E7800 Pure hypercholesterolemia, unspecified: Secondary | ICD-10-CM | POA: Diagnosis not present

## 2018-05-24 DIAGNOSIS — Z8639 Personal history of other endocrine, nutritional and metabolic disease: Secondary | ICD-10-CM | POA: Diagnosis not present

## 2018-05-24 DIAGNOSIS — E7800 Pure hypercholesterolemia, unspecified: Secondary | ICD-10-CM | POA: Diagnosis not present

## 2018-05-24 DIAGNOSIS — R7303 Prediabetes: Secondary | ICD-10-CM | POA: Diagnosis not present

## 2018-06-02 DIAGNOSIS — H900 Conductive hearing loss, bilateral: Secondary | ICD-10-CM | POA: Diagnosis not present

## 2018-06-02 DIAGNOSIS — H722X9 Other marginal perforations of tympanic membrane, unspecified ear: Secondary | ICD-10-CM | POA: Diagnosis not present

## 2018-06-02 DIAGNOSIS — H6980 Other specified disorders of Eustachian tube, unspecified ear: Secondary | ICD-10-CM | POA: Diagnosis not present

## 2018-06-07 ENCOUNTER — Ambulatory Visit: Payer: 59 | Admitting: Urology

## 2018-06-14 ENCOUNTER — Other Ambulatory Visit (INDEPENDENT_AMBULATORY_CARE_PROVIDER_SITE_OTHER): Payer: 59

## 2018-06-14 ENCOUNTER — Encounter: Payer: Self-pay | Admitting: Obstetrics and Gynecology

## 2018-06-14 ENCOUNTER — Ambulatory Visit (INDEPENDENT_AMBULATORY_CARE_PROVIDER_SITE_OTHER): Payer: 59 | Admitting: Obstetrics and Gynecology

## 2018-06-14 ENCOUNTER — Other Ambulatory Visit (HOSPITAL_COMMUNITY)
Admission: RE | Admit: 2018-06-14 | Discharge: 2018-06-14 | Disposition: A | Discharge status: Home / Self Care | Payer: 59 | Source: Ambulatory Visit | Attending: Obstetrics and Gynecology | Admitting: Obstetrics and Gynecology

## 2018-06-14 ENCOUNTER — Other Ambulatory Visit: Payer: Self-pay | Admitting: Obstetrics and Gynecology

## 2018-06-14 ENCOUNTER — Encounter: Payer: Self-pay | Admitting: Urology

## 2018-06-14 ENCOUNTER — Ambulatory Visit (INDEPENDENT_AMBULATORY_CARE_PROVIDER_SITE_OTHER): Payer: 59 | Admitting: Urology

## 2018-06-14 VITALS — BP 134/85 | HR 88 | Ht 62.0 in | Wt 130.0 lb

## 2018-06-14 VITALS — BP 135/87 | HR 85 | Ht 62.0 in | Wt 129.9 lb

## 2018-06-14 DIAGNOSIS — N858 Other specified noninflammatory disorders of uterus: Secondary | ICD-10-CM | POA: Diagnosis not present

## 2018-06-14 DIAGNOSIS — Z8639 Personal history of other endocrine, nutritional and metabolic disease: Secondary | ICD-10-CM | POA: Insufficient documentation

## 2018-06-14 DIAGNOSIS — E7800 Pure hypercholesterolemia, unspecified: Secondary | ICD-10-CM | POA: Insufficient documentation

## 2018-06-14 DIAGNOSIS — N950 Postmenopausal bleeding: Secondary | ICD-10-CM

## 2018-06-14 DIAGNOSIS — J310 Chronic rhinitis: Secondary | ICD-10-CM | POA: Insufficient documentation

## 2018-06-14 DIAGNOSIS — N952 Postmenopausal atrophic vaginitis: Secondary | ICD-10-CM | POA: Diagnosis not present

## 2018-06-14 DIAGNOSIS — L719 Rosacea, unspecified: Secondary | ICD-10-CM | POA: Insufficient documentation

## 2018-06-14 DIAGNOSIS — N393 Stress incontinence (female) (male): Secondary | ICD-10-CM | POA: Diagnosis not present

## 2018-06-14 DIAGNOSIS — Z87440 Personal history of urinary (tract) infections: Secondary | ICD-10-CM | POA: Diagnosis not present

## 2018-06-14 DIAGNOSIS — N882 Stricture and stenosis of cervix uteri: Secondary | ICD-10-CM

## 2018-06-14 DIAGNOSIS — N8111 Cystocele, midline: Secondary | ICD-10-CM

## 2018-06-14 LAB — BLADDER SCAN AMB NON-IMAGING

## 2018-06-14 MED ORDER — NITROFURANTOIN MONOHYD MACRO 100 MG PO CAPS: ORAL_CAPSULE | ORAL | 0 refills | Status: DC

## 2018-06-14 NOTE — Progress Notes (Signed)
06/14/2018 1:46 PM   [name] May 09, 1961 [id number]  Referring provider: Dion Body, MD [facility] [name],  [address]  Chief Complaint  Patient presents with  . Over Active Bladder    1year    HPI: 57 yo WF with recurrent UTI's, vaginal atrophy, SUI and cystocele for a one year follow up.    rUTI's Risk factors: age, post menopausal state, consumption of surgery drinks, constipation and incontinence.  RUS performed on 02/28/2017 was normal.  She had an UTI in August 2019 - self diagnosed with OTC test strips.     Vaginal atrophy Risk factor for rUTI.  Of note, she is having some vaginal bleeding.  Her gynecologist feels it is most likely due to the initiation of vaginal estrogen cream, but she is having a pelvic ultrasound this afternoon for confirmation.  SUI Managed with imipramine prescribed by her PCP.  The patient has been experiencing urgency x 0-3 (stable), frequency x 4-7 (stable), not restricting fluids to avoid visits to the restroom, not engaging in toilet mapping, incontinence x 0-3 (stable) and nocturia x 0-3 (stable).  Patient denies any gross hematuria, dysuria or suprapubic/flank pain.  Patient denies any fevers, chills, nausea or vomiting.  Her PVR is 14 mL    Cystocele Patient is having success with imipramine.  She is not having any stress incontinence while exercising is able to run 4 times a week.  PMH: Past Medical History:  Diagnosis Date  . Allergy   . Arthritis    osteoarthritis - fingers  . Asthma   . Chicken pox   . Chronic UTI   . Cystocele    2nd degree  . Endometriosis   . Family history of polyps in the colon   . Graves disease   . History of heavy periods   . Hyperlipemia   . IBS (irritable bowel syndrome)   . Perimenopausal   . PONV (postoperative nausea and vomiting)    nausea only  . Rectocele    mild  . S/P endometrial ablation   . Scoliosis   . Skin cancer   . SUI  (stress urinary incontinence, female)   . SVT (supraventricular tachycardia) ([name])   . Urine incontinence   . Wears contact lenses     Surgical History: Past Surgical History:  Procedure Laterality Date  . endometrrial ablation    . EYE SURGERY    . knee athroscopy Left 2013  . laprascopic surgery     endometreosis  . NASOPHARYNGOSCOPY EUSTATION TUBE BALLOON DILATION Bilateral 05/18/2018   Procedure: NASOPHARYNGOSCOPY EUSTATION TUBE BALLOON DILATION OUTFRACTURE BILATERAL INFERIOR TURBINATES;  Surgeon: Margaretha Sheffield, MD;  Location: [facility];  Service: ENT;  Laterality: Bilateral;  . THYROIDECTOMY, PARTIAL    . TONSILLECTOMY AND ADENOIDECTOMY    . tubes in ears      Home Medications:  Allergies as of 06/14/2018      Reactions   Amoxicillin    Bextra [valdecoxib]    Ciprofloxacin    Floxin [ofloxacin]    Gyne-lotrimin [clotrimazole]    Levofloxacin    Penicillin V Potassium Swelling   Caused fever and swelling   Quinolones Other (See Comments)   Septra [sulfamethoxazole-trimethoprim] Other (See Comments)   Flushed and fever.   Sulfa Antibiotics Other (See Comments)   fever   Penicillins Rash      Medication List        Accurate as of 06/14/18  1:46 PM. Always use  your most recent med list.          albuterol 108 (90 Base) MCG/ACT inhaler Commonly known as:  PROVENTIL HFA;VENTOLIN HFA Inhale 2 puffs into the lungs every 6 (six) hours as needed for wheezing or shortness of breath.   ALLEGRA ALLERGY 180 MG tablet Generic drug:  fexofenadine Allegra Allergy 180 mg tablet  Take 1 tablet every day by oral route.   aspirin 81 MG tablet Take 81 mg by mouth daily.   cholecalciferol 1000 units tablet Commonly known as:  VITAMIN D Take 1,000 Units by mouth daily.   conjugated estrogens vaginal cream Commonly known as:  PREMARIN Place 0.5 Applicatorfuls vaginally 2 (two) times a week.   CRANBERRY PO Take by mouth.   EPIPEN 2-PAK 0.3 mg/0.3 mL Soaj  injection Generic drug:  EPINEPHrine USE FOR ANAPHYLAXIS   fluticasone 50 MCG/ACT nasal spray Commonly known as:  FLONASE Place 1 spray into both nostrils daily.   imipramine 25 MG tablet Commonly known as:  TOFRANIL Take 50 mg by mouth at bedtime.   levothyroxine 88 MCG tablet Commonly known as:  SYNTHROID, LEVOTHROID Take 88 mcg by mouth daily before breakfast.   lovastatin 20 MG tablet Commonly known as:  MEVACOR Take by mouth.   metoprolol succinate 50 MG 24 hr tablet Commonly known as:  TOPROL-XL Take 50 mg by mouth daily. Take with or immediately following a meal.   montelukast 10 MG tablet Commonly known as:  SINGULAIR Take 10 mg by mouth at bedtime.   MULTI-VITAMINS Tabs Take by mouth.   nitrofurantoin (macrocrystal-monohydrate) 100 MG capsule Commonly known as:  MACROBID Take one after intercourse       Allergies:  Allergies  Allergen Reactions  . Amoxicillin   . Bextra [Valdecoxib]   . Ciprofloxacin   . Floxin [Ofloxacin]   . Gyne-Lotrimin [Clotrimazole]   . Levofloxacin   . Penicillin V Potassium Swelling    Caused fever and swelling  . Quinolones Other (See Comments)  . Septra [Sulfamethoxazole-Trimethoprim] Other (See Comments)    Flushed and fever.  . Sulfa Antibiotics Other (See Comments)    fever  . Penicillins Rash    Family History: Family History  Problem Relation Age of Onset  . Cancer Mother   . Hyperlipidemia Mother   . Heart disease Mother   . Hypertension Mother   . Breast cancer Mother 66  . Kidney failure Mother   . Hyperlipidemia Father   . Heart disease Father   . Hypertension Father   . Cancer Father        colon  . Breast cancer Paternal Aunt 81  . Breast cancer Maternal Aunt 85  . Kidney cancer Neg Hx   . Bladder Cancer Neg Hx   . Prostate cancer Neg Hx   . Ovarian cancer Neg Hx   . Diabetes Neg Hx     Social History:  reports that she has never smoked. She has never used smokeless tobacco. She reports that  she does not drink alcohol or use drugs.  ROS: UROLOGY Frequent Urination?: No Hard to postpone urination?: No Burning/pain with urination?: No Get up at night to urinate?: No Leakage of urine?: Yes Urine stream starts and stops?: No Trouble starting stream?: No Do you have to strain to urinate?: No Blood in urine?: No Urinary tract infection?: Yes Sexually transmitted disease?: No Injury to kidneys or bladder?: No Painful intercourse?: No Weak stream?: No Currently pregnant?: No Vaginal bleeding?: Yes Last menstrual period?: n  Gastrointestinal Nausea?: No Vomiting?: No Indigestion/heartburn?: No Diarrhea?: No Constipation?: No  Constitutional Fever: No Night sweats?: No Weight loss?: No Fatigue?: No  Skin Skin rash/lesions?: No Itching?: No  Eyes Blurred vision?: No Double vision?: No  Ears/Nose/Throat Sore throat?: No Sinus problems?: No  Hematologic/Lymphatic Swollen glands?: No Easy bruising?: No  Cardiovascular Leg swelling?: No Chest pain?: No  Respiratory Cough?: No Shortness of breath?: No  Endocrine Excessive thirst?: No  Musculoskeletal Back pain?: No Joint pain?: No  Neurological Headaches?: No Dizziness?: No  Psychologic Depression?: No Anxiety?: No  Physical Exam: BP 134/85   Pulse 88   Ht 5\' 2"  (1.575 m)   Wt 130 lb (59 kg)   LMP 07/01/2014 (Exact Date)   BMI 23.78 kg/m   Constitutional: Well nourished. Alert and oriented, No acute distress. HEENT: [place] AT, moist mucus membranes. Trachea midline, no masses. Cardiovascular: No clubbing, cyanosis, or edema. Respiratory: Normal respiratory effort, no increased work of breathing. Skin: No rashes, bruises or suspicious lesions. Lymph: No cervical or inguinal adenopathy. Neurologic: Grossly intact, no focal deficits, moving all 4 extremities. Psychiatric: Normal mood and affect.   Pertinent Imaging: Results for ANNELLA, PROWELL (MRN [id number]) as of 06/14/2018  13:53  Ref. Range 06/14/2018 13:32  Scan Result Unknown 54ml    Assessment & Plan:    1. Recurrent UTI's Continue Macrobid for postcoital prophylactics Patient advised to contact us if she should experience recurrent UTIs, UTI with fever/chills or gross hematuria  2. Vaginal atrophy Patient prescribed vaginal estrogen cream by her gynecologist Experiencing vaginal bleeding, pelvic ultrasound pending  3. SUI  - not bothersome to patient at this time  - managed with imipramine by PCP  4. Cystocele  - see above                                   Return in about 1 year (around 06/15/2019) for PVR and OAB questionnaire.  These notes generated with voice recognition software. I apologize for typographical errors.  Zara Council, PA-C  [facility] [address] [place], [place] [address] [phone]

## 2018-06-14 NOTE — Patient Instructions (Signed)
1.  Endocervical canal dilation and endometrial biopsy is performed today. 2.  Results will be made available through my chart or by phone. 3.  Maintain menstrual calendar monitoring for any abnormal uterine bleeding 4.  Post biopsy instructions are given:   Endometrial Biopsy, Care After This sheet gives you information about how to care for yourself after your procedure. Your health care provider may also give you more specific instructions. If you have problems or questions, contact your health care provider. What can I expect after the procedure? After the procedure, it is common to have:  Mild cramping.  A small amount of vaginal bleeding for a few days. This is normal.  Follow these instructions at home:  Take over-the-counter and prescription medicines only as told by your health care provider.  Do not douche, use tampons, or have sexual intercourse until your health care provider approves.  Return to your normal activities as told by your health care provider. Ask your health care provider what activities are safe for you.  Follow instructions from your health care provider about any activity restrictions, such as restrictions on strenuous exercise or heavy lifting. Contact a health care provider if:  You have heavy bleeding, or bleed for longer than 2 days after the procedure.  You have bad smelling discharge from your vagina.  You have a fever or chills.  You have a burning sensation when urinating or you have difficulty urinating.  You have severe pain in your lower abdomen. Get help right away if:  You have severe cramps in your stomach or back.  You pass large blood clots.  Your bleeding increases.  You become weak or light-headed, or you pass out. Summary  After the procedure, it is common to have mild cramping and a small amount of vaginal bleeding for a few days.  Do not douche, use tampons, or have sexual intercourse until your health care provider  approves.  Return to your normal activities as told by your health care provider. Ask your health care provider what activities are safe for you. This information is not intended to replace advice given to you by your health care provider. Make sure you discuss any questions you have with your health care provider. Document Released: 05/30/2013 Document Revised: 08/25/2016 Document Reviewed: 08/25/2016 Elsevier Interactive Patient Education  2017 [employer].

## 2018-06-14 NOTE — Progress Notes (Addendum)
Chief complaint: 1.  Postmenopausal bleeding 2.  Recent start of vaginal ERT for urogenital atrophy symptoms  She last started Premarin vaginal cream 1/2 g twice weekly for urogenital atrophy symptoms.  Patient c/o light BRB after IC 1 week ago, bleeding lasted about 4 hours, none since then. Patient denies pain or discomfort.  No pelvic cramping. Bowel function is normal. Bladder function is normal. History of endometrial ablation  Ultrasound today:  Indications: PMB Findings:  The uterus measures 6.4 x 2.9 x 2.8 cm.  Slightly small for age? Echo texture is heterogeneous without evidence of focal masses. Small calcifications are noted throughout the uterus, however, this does not look to be suggestive of fibroids.  The Endometrium is unable to be delineated due to scattered calcifications.  Patient is status post endometrial ablation.  Right Ovary measures 1.8 x 1.1 x 1.0 cm. It is normal in appearance. Left Ovary measures 2.0 x 1.3 x 1.1 cm. It is normal appearance. Survey of the adnexa demonstrates no adnexal masses. There is no free fluid in the cul de sac.  Impression: 1. Anteverted uterus appears of normal contour.  Slightly small for age? 2. Scattered small calcifications throughout uterus. 3. Endometrium is not able to be visualized. 4. Bilateral ovaries appear WNL.   Recommendations: 1.Clinical correlation with the patient's History and Physical Exam.  Dario Ave, RDMS Brayton Mars, MD  OBJECTIVE: BP 135/87   Pulse 85   Ht 5\' 2"  (1.575 m)   Wt 129 lb 14.4 oz (58.9 kg)   LMP 07/01/2014 (Exact Date)   BMI 23.76 kg/m  Pleasant female no acute distress.  Alert and oriented.  Affect is appropriate. Abdomen: Soft, nontender Pelvic exam: External genitalia-normal BUS-normal Vagina-decreased estrogen effect; no vaginal discharge or bleeding Cervix-stenotic; no cervical motion tenderness Uterus-midplane, normal size shape, mobile,  nontender Adnexa-nonpalpable nontender Rectovaginal-normal external exam  PROCEDURE: Endometrial biopsy with endocervical canal dilation Verbal consent is obtained.  Patient is placed in the dorsolithotomy position.  Bimanual exam demonstrates a midplane uterus of normal size and shape.  Graves speculum was placed into the vagina.  Single-tooth sac was placed on the Intra-Op of the cervix.  The 3 mm pipette is unable to be inserted because of cervical stenosis.  Lacrimal duct probes were used to dilate the endocervical canal.  The canal ultimately is dilated and sounds to 5 cm with lacrimal duct probe.  The 3 mm pipette was then inserted with removal of tissue specimen in standard fashion; scant tissue was obtained.  Procedure was well-tolerated.  No complications.  ASSESSMENT: 1.  Postmenopausal bleeding, onset after initiation of ERT for urogenital atrophy symptoms 2.  Inconclusive ultrasound with uncertain endometrium thickness because of scarring and ultrasound technique  PLAN: 1.  Results from ultrasound have been reviewed 2.  Endometrial biopsy is performed as noted following endocervical canal dilation 3.  Results will be made available to patient 4.  Maintain menstrual calendar monitoring over the next 6 months.  Brayton Mars, MD  Note: This dictation was prepared with Dragon dictation along with smaller phrase technology. Any transcriptional errors that result from this process are unintentional.

## 2018-07-10 ENCOUNTER — Ambulatory Visit
Admission: RE | Admit: 2018-07-10 | Discharge: 2018-07-10 | Disposition: A | Discharge status: Home / Self Care | Payer: 59 | Source: Ambulatory Visit | Attending: Unknown Physician Specialty | Admitting: Unknown Physician Specialty

## 2018-07-10 ENCOUNTER — Ambulatory Visit: Payer: 59 | Admitting: Anesthesiology

## 2018-07-10 ENCOUNTER — Encounter
Admission: RE | Disposition: A | Discharge status: Home / Self Care | Payer: Self-pay | Source: Ambulatory Visit | Attending: Unknown Physician Specialty

## 2018-07-10 DIAGNOSIS — Z8371 Family history of colonic polyps: Secondary | ICD-10-CM | POA: Insufficient documentation

## 2018-07-10 DIAGNOSIS — Z7982 Long term (current) use of aspirin: Secondary | ICD-10-CM | POA: Insufficient documentation

## 2018-07-10 DIAGNOSIS — Z79890 Hormone replacement therapy (postmenopausal): Secondary | ICD-10-CM | POA: Insufficient documentation

## 2018-07-10 DIAGNOSIS — Q439 Congenital malformation of intestine, unspecified: Secondary | ICD-10-CM | POA: Diagnosis not present

## 2018-07-10 DIAGNOSIS — K635 Polyp of colon: Secondary | ICD-10-CM | POA: Diagnosis not present

## 2018-07-10 DIAGNOSIS — Z09 Encounter for follow-up examination after completed treatment for conditions other than malignant neoplasm: Secondary | ICD-10-CM | POA: Diagnosis not present

## 2018-07-10 DIAGNOSIS — K640 First degree hemorrhoids: Secondary | ICD-10-CM | POA: Insufficient documentation

## 2018-07-10 DIAGNOSIS — Z86010 Personal history of colonic polyps: Secondary | ICD-10-CM | POA: Diagnosis not present

## 2018-07-10 DIAGNOSIS — Z800 Family history of malignant neoplasm of digestive organs: Secondary | ICD-10-CM | POA: Insufficient documentation

## 2018-07-10 DIAGNOSIS — Z7951 Long term (current) use of inhaled steroids: Secondary | ICD-10-CM | POA: Insufficient documentation

## 2018-07-10 DIAGNOSIS — Z85828 Personal history of other malignant neoplasm of skin: Secondary | ICD-10-CM | POA: Diagnosis not present

## 2018-07-10 DIAGNOSIS — Z79899 Other long term (current) drug therapy: Secondary | ICD-10-CM | POA: Diagnosis not present

## 2018-07-10 DIAGNOSIS — J45909 Unspecified asthma, uncomplicated: Secondary | ICD-10-CM | POA: Insufficient documentation

## 2018-07-10 DIAGNOSIS — D125 Benign neoplasm of sigmoid colon: Secondary | ICD-10-CM | POA: Diagnosis not present

## 2018-07-10 DIAGNOSIS — E785 Hyperlipidemia, unspecified: Secondary | ICD-10-CM | POA: Diagnosis not present

## 2018-07-10 DIAGNOSIS — Z1211 Encounter for screening for malignant neoplasm of colon: Secondary | ICD-10-CM | POA: Diagnosis not present

## 2018-07-10 HISTORY — PX: COLONOSCOPY WITH PROPOFOL: SHX5780

## 2018-07-10 SURGERY — COLONOSCOPY WITH PROPOFOL
Anesthesia: General

## 2018-07-10 MED ORDER — SODIUM CHLORIDE 0.9 % IV SOLN
INTRAVENOUS | Status: DC
  Administered 2018-07-10: 1000 mL via INTRAVENOUS

## 2018-07-10 MED ORDER — FENTANYL CITRATE (PF) 100 MCG/2ML IJ SOLN
INTRAMUSCULAR | Status: DC | PRN
  Administered 2018-07-10 (×2): 50 ug via INTRAVENOUS

## 2018-07-10 MED ORDER — MIDAZOLAM HCL 5 MG/5ML IJ SOLN
INTRAMUSCULAR | Status: DC | PRN
  Administered 2018-07-10: 2 mg via INTRAVENOUS

## 2018-07-10 MED ORDER — LIDOCAINE HCL (PF) 2 % IJ SOLN
INTRAMUSCULAR | Status: DC | PRN
  Administered 2018-07-10: 50 mg

## 2018-07-10 MED ORDER — LIDOCAINE HCL (PF) 2 % IJ SOLN
INTRAMUSCULAR | Status: AC
  Filled 2018-07-10: qty 10

## 2018-07-10 MED ORDER — MIDAZOLAM HCL 2 MG/2ML IJ SOLN
INTRAMUSCULAR | Status: AC
  Filled 2018-07-10: qty 2

## 2018-07-10 MED ORDER — PROPOFOL 10 MG/ML IV BOLUS
INTRAVENOUS | Status: DC | PRN
  Administered 2018-07-10: 10 mg via INTRAVENOUS
  Administered 2018-07-10: 30 mg via INTRAVENOUS
  Administered 2018-07-10: 20 mg via INTRAVENOUS

## 2018-07-10 MED ORDER — PROPOFOL 500 MG/50ML IV EMUL
INTRAVENOUS | Status: DC | PRN
  Administered 2018-07-10: 50 ug/kg/min via INTRAVENOUS

## 2018-07-10 MED ORDER — FENTANYL CITRATE (PF) 100 MCG/2ML IJ SOLN
INTRAMUSCULAR | Status: AC
  Filled 2018-07-10: qty 2

## 2018-07-10 NOTE — Anesthesia Preprocedure Evaluation (Signed)
Anesthesia Evaluation  Patient identified by MRN, date of birth, ID band Patient awake    Reviewed: Allergy & Precautions, NPO status , Patient's Chart, lab work & pertinent test results  History of Anesthesia Complications (+) PONV and history of anesthetic complications  Airway Mallampati: II  TM Distance: >3 FB Neck ROM: Full    Dental no notable dental hx.    Pulmonary asthma , neg sleep apnea,    breath sounds clear to auscultation- rhonchi (-) wheezing      Cardiovascular Exercise Tolerance: Good (-) hypertension(-) CAD, (-) Past MI, (-) Cardiac Stents and (-) CABG + dysrhythmias Supra Ventricular Tachycardia  Rhythm:Regular Rate:Normal - Systolic murmurs and - Diastolic murmurs    Neuro/Psych negative neurological ROS  negative psych ROS   GI/Hepatic negative GI ROS, Neg liver ROS,   Endo/Other  neg diabetes  Renal/GU negative Renal ROS     Musculoskeletal  (+) Arthritis ,   Abdominal (+) - obese,   Peds  Hematology negative hematology ROS (+)   Anesthesia Other Findings Past Medical History: No date: Allergy No date: Arthritis     Comment:  osteoarthritis - fingers No date: Asthma No date: Chicken pox No date: Chronic UTI No date: Cystocele     Comment:  2nd degree No date: Endometriosis No date: Family history of polyps in the colon No date: Graves disease No date: History of heavy periods No date: Hyperlipemia No date: IBS (irritable bowel syndrome) No date: Perimenopausal No date: PONV (postoperative nausea and vomiting)     Comment:  nausea only No date: Rectocele     Comment:  mild No date: S/P endometrial ablation No date: Scoliosis No date: Skin cancer No date: SUI (stress urinary incontinence, female) No date: SVT (supraventricular tachycardia) ([facility]) No date: Urine incontinence No date: Wears contact lenses   Reproductive/Obstetrics                              Anesthesia Physical Anesthesia Plan  ASA: II  Anesthesia Plan: General   Post-op Pain Management:    Induction: Intravenous  PONV Risk Score and Plan: 3 and Propofol infusion  Airway Management Planned: Natural Airway  Additional Equipment:   Intra-op Plan:   Post-operative Plan:   Informed Consent: I have reviewed the patients History and Physical, chart, labs and discussed the procedure including the risks, benefits and alternatives for the proposed anesthesia with the patient or authorized representative who has indicated his/her understanding and acceptance.   Dental advisory given  Plan Discussed with: CRNA and Anesthesiologist  Anesthesia Plan Comments:         Anesthesia Quick Evaluation

## 2018-07-10 NOTE — Anesthesia Post-op Follow-up Note (Signed)
Anesthesia QCDR form completed.        

## 2018-07-10 NOTE — Op Note (Signed)
[name] Gastroenterology Patient Name: [name] Procedure Date: 07/10/2018 2:57 PM MRN: [id number] Account #: [id number] Date of Birth: Jun 14, 1961 Admit Type: Outpatient Age: 57 Room: [facility] ENDO ROOM 1 Gender: Female Note Status: Finalized Procedure:            Colonoscopy Indications:          High risk colon cancer surveillance: Personal history                        of colonic polyps, Family history of colon cancer in a                        first-degree relative Providers:            Manya Silvas, MD Referring MD:         Dion Body (Referring MD) Medicines:            Propofol per Anesthesia Complications:        No immediate complications. Procedure:            Pre-Anesthesia Assessment:                       - After reviewing the risks and benefits, the patient                        was deemed in satisfactory condition to undergo the                        procedure.                       After obtaining informed consent, the colonoscope was                        passed under direct vision. Throughout the procedure,                        the patient's blood pressure, pulse, and oxygen                        saturations were monitored continuously. The                        Colonoscope was introduced through the anus and                        advanced to the the cecum, identified by appendiceal                        orifice and ileocecal valve. The colonoscopy was                        somewhat difficult due to a tortuous colon. Successful                        completion of the procedure was aided by applying                        abdominal pressure. The patient tolerated the procedure  well. The quality of the bowel preparation was                        excellent. Findings:      A diminutive polyp was found in the sigmoid colon. The polyp was       sessile. The polyp was removed with a jumbo cold  forceps. Resection and       retrieval were complete.      Internal hemorrhoids were found during endoscopy. The hemorrhoids were       small and Grade I (internal hemorrhoids that do not prolapse).      The exam was otherwise without abnormality. Impression:           - One diminutive polyp in the sigmoid colon, removed                        with a jumbo cold forceps. Resected and retrieved.                       - Internal hemorrhoids.                       - The examination was otherwise normal. Recommendation:       - Await pathology results. Manya Silvas, MD 07/10/2018 3:36:27 PM This report has been signed electronically. Number of Addenda: 0 Note Initiated On: 07/10/2018 2:57 PM Scope Withdrawal Time: 0 hours 11 minutes 59 seconds  Total Procedure Duration: 0 hours 21 minutes 7 seconds       [facility]

## 2018-07-10 NOTE — Transfer of Care (Signed)
Immediate Anesthesia Transfer of Care Note  Patient: [name]  Procedure(s) Performed: COLONOSCOPY WITH PROPOFOL (N/A )  Patient Location: PACU  Anesthesia Type:General  Level of Consciousness: sedated  Airway & Oxygen Therapy: Patient Spontanous Breathing and Patient connected to nasal cannula oxygen  Post-op Assessment: Report given to RN and Post -op Vital signs reviewed and stable  Post vital signs: Reviewed and stable  Last Vitals:  Vitals Value Taken Time  BP    Temp    Pulse    Resp    SpO2      Last Pain:  Vitals:   07/10/18 1424  TempSrc: Tympanic  PainSc: 6       Patients Stated Pain Goal: 0 (56/38/75 6433)  Complications: No apparent anesthesia complications

## 2018-07-10 NOTE — Anesthesia Postprocedure Evaluation (Signed)
Anesthesia Post Note  Patient: [name]  Procedure(s) Performed: COLONOSCOPY WITH PROPOFOL (N/A )  Patient location during evaluation: Endoscopy Anesthesia Type: General Level of consciousness: awake and alert Pain management: pain level controlled Vital Signs Assessment: post-procedure vital signs reviewed and stable Respiratory status: spontaneous breathing, nonlabored ventilation, respiratory function stable and patient connected to nasal cannula oxygen Cardiovascular status: blood pressure returned to baseline and stable Postop Assessment: no apparent nausea or vomiting Anesthetic complications: no     Last Vitals:  Vitals:   07/10/18 1546 07/10/18 1556  BP: 126/76 137/89  Pulse: 75 82  Resp: 19 16  Temp:    SpO2: 94% 96%    Last Pain:  Vitals:   07/10/18 1556  TempSrc:   PainSc: 0-No pain                 Xitlalic Maslin S

## 2018-07-10 NOTE — H&P (Signed)
Primary Care Physician:  Dion Body, MD Primary Gastroenterologist:  Dr. Vira Agar  Pre-Procedure History & Physical: HPI:  [name] is a 57 y.o. female is here for a colonoscopy past one was for [facility] colon polyps and FH colon cancer in father.    Past Medical History:  Diagnosis Date  . Allergy   . Arthritis    osteoarthritis - fingers  . Asthma   . Chicken pox   . Chronic UTI   . Cystocele    2nd degree  . Endometriosis   . Family history of polyps in the colon   . Graves disease   . History of heavy periods   . Hyperlipemia   . IBS (irritable bowel syndrome)   . Perimenopausal   . PONV (postoperative nausea and vomiting)    nausea only  . Rectocele    mild  . S/P endometrial ablation   . Scoliosis   . Skin cancer   . SUI (stress urinary incontinence, female)   . SVT (supraventricular tachycardia) ([place])   . Urine incontinence   . Wears contact lenses     Past Surgical History:  Procedure Laterality Date  . endometrrial ablation    . EYE SURGERY    . knee athroscopy Left 2013  . laprascopic surgery     endometreosis  . NASOPHARYNGOSCOPY EUSTATION TUBE BALLOON DILATION Bilateral 05/18/2018   Procedure: NASOPHARYNGOSCOPY EUSTATION TUBE BALLOON DILATION OUTFRACTURE BILATERAL INFERIOR TURBINATES;  Surgeon: Margaretha Sheffield, MD;  Location: [place];  Service: ENT;  Laterality: Bilateral;  . NASOPHARYNGOSCOPY EUSTATION TUBE BALLOON DILATION    . nova-sure    . THYROIDECTOMY, PARTIAL    . TONSILLECTOMY AND ADENOIDECTOMY    . tubes in ears      Prior to Admission medications   Medication Sig Start Date End Date Taking? Authorizing Provider  albuterol (PROVENTIL HFA;VENTOLIN HFA) 108 (90 BASE) MCG/ACT inhaler Inhale 2 puffs into the lungs every 6 (six) hours as needed for wheezing or shortness of breath.   Yes [provider]  aspirin 81 MG tablet Take 81 mg by mouth daily.   Yes [provider]  cholecalciferol (VITAMIN D)  1000 UNITS tablet Take 1,000 Units by mouth daily.   Yes [provider]  CRANBERRY PO Take by mouth.   Yes [provider]  EPIPEN 2-PAK 0.3 MG/0.3ML SOAJ injection USE FOR ANAPHYLAXIS 11/06/15  Yes Fisher, Linden Dolin, PA-C  fexofenadine [facility] ALLERGY) 180 MG tablet Allegra Allergy 180 mg tablet  Take 1 tablet every day by oral route.   Yes [provider]  fluticasone (FLONASE) 50 MCG/ACT nasal spray Place 1 spray into both nostrils daily.   Yes [provider]  imipramine (TOFRANIL) 25 MG tablet Take 50 mg by mouth at bedtime.   Yes [provider]  levothyroxine (SYNTHROID, LEVOTHROID) 75 MCG tablet Take 75 mcg by mouth daily before breakfast.   Yes [provider]  lovastatin (MEVACOR) 20 MG tablet Take 20 mg by mouth at bedtime.   Yes [provider]  metoprolol succinate (TOPROL-XL) 50 MG 24 hr tablet Take 50 mg by mouth daily. Take with or immediately following a meal.   Yes [provider]  montelukast (SINGULAIR) 10 MG tablet Take 10 mg by mouth at bedtime.   Yes [provider]  Multiple Vitamin (MULTI-VITAMINS) TABS Take by mouth.   Yes [provider]  nitrofurantoin, macrocrystal-monohydrate, (MACROBID) 100 MG capsule Take one after intercourse Patient not taking: Reported on  07/10/2018 06/14/18   Zara Council A, PA-C    Allergies as of 07/06/2018 - Review Complete 06/14/2018  Allergen Reaction Noted  . Amoxicillin  01/18/2014  . Bextra [valdecoxib]  01/18/2014  . Ciprofloxacin  01/18/2014  . Floxin [ofloxacin]  01/18/2014  . Gyne-lotrimin [clotrimazole]  01/18/2014  . Levofloxacin    . Penicillin v potassium Swelling 06/10/2014  . Quinolones Other (See Comments) 06/15/2013  . Septra [sulfamethoxazole-trimethoprim] Other (See Comments) 01/18/2014  . Sulfa antibiotics Other (See Comments) 12/25/2014  . Penicillins Rash 12/25/2014    Family History  Problem Relation Age of Onset   . Cancer Mother   . Hyperlipidemia Mother   . Heart disease Mother   . Hypertension Mother   . Breast cancer Mother 85  . Kidney failure Mother   . Stroke Mother   . Hyperlipidemia Father   . Heart disease Father   . Hypertension Father   . Cancer Father        colon  . Breast cancer Paternal Aunt 75  . Breast cancer Maternal Aunt 85  . Kidney cancer Neg Hx   . Bladder Cancer Neg Hx   . Prostate cancer Neg Hx   . Ovarian cancer Neg Hx   . Diabetes Neg Hx     Social History   Socioeconomic History  . Marital status: Married    Spouse name: Not on file  . Number of children: Not on file  . Years of education: Not on file  . Highest education level: Not on file  Occupational History  . 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 Financial resource strain: Not on file  . Food insecurity:    Worry: Not on file    Inability: Not on file  . Transportation needs:    Medical: Not on file    Non-medical: Not on file  Tobacco Use  . Smoking status: Never Smoker  . Smokeless tobacco: Never Used  Substance and Sexual Activity  . Alcohol use: No    Alcohol/week: 0.0 standard drinks  . Drug use: No  . Sexual activity: Yes    Birth control/protection: Post-menopausal  Lifestyle  . Physical activity:    Days per week: 4 days    Minutes per session: 40 min  . Stress: Not on file  Relationships  . Social connections:    Talks on phone: Not on file    Gets together: Not on file    Attends religious service: Not on file    Active member of club or organization: Not on file    Attends meetings of clubs or organizations: Not on file    Relationship status: Not on file  . Intimate partner violence:    Fear of current or ex partner: Not on file    Emotionally abused: Not on file    Physically abused: Not on file    Forced sexual activity: Not on file  Other Topics Concern  . Not on file  Social History Narrative  . Not on file    Review of Systems: See HPI,  otherwise negative ROS  Physical Exam: BP (!) 134/96   Pulse 91   Temp 97.9 F (36.6 C) (Tympanic)   Resp 20   Ht 5\' 2"  (1.575 m)   Wt 56.7 kg   LMP 07/01/2014 (Exact Date)   SpO2 100%   BMI 22.86 kg/m  General:   Alert,  pleasant and cooperative in NAD Head:  Normocephalic and atraumatic. Neck:  Supple; no masses  or thyromegaly. Lungs:  Clear throughout to auscultation.    Heart:  Regular rate and rhythm. Abdomen:  Soft, nontender and nondistended. Normal bowel sounds, without guarding, and without rebound.   Neurologic:  Alert and  oriented x4;  grossly normal neurologically.  Impression/Plan: Ottilia Pippenger Medley is here for an colonoscopy to be performed for [facility] colon polyps.  Risks, benefits, limitations, and alternatives regarding  colonoscopy have been reviewed with the patient.  Questions have been answered.  All parties agreeable.   Gaylyn Cheers, MD  07/10/2018, 3:02 PM

## 2018-07-12 DIAGNOSIS — M67449 Ganglion, unspecified hand: Secondary | ICD-10-CM | POA: Insufficient documentation

## 2018-07-12 LAB — SURGICAL PATHOLOGY

## 2018-07-14 ENCOUNTER — Encounter: Payer: Self-pay | Admitting: Unknown Physician Specialty

## 2018-07-28 DIAGNOSIS — Z8639 Personal history of other endocrine, nutritional and metabolic disease: Secondary | ICD-10-CM | POA: Diagnosis not present

## 2018-08-14 DIAGNOSIS — M71342 Other bursal cyst, left hand: Secondary | ICD-10-CM | POA: Diagnosis not present

## 2018-09-22 DIAGNOSIS — E7800 Pure hypercholesterolemia, unspecified: Secondary | ICD-10-CM | POA: Diagnosis not present

## 2018-09-22 DIAGNOSIS — Z8639 Personal history of other endocrine, nutritional and metabolic disease: Secondary | ICD-10-CM | POA: Diagnosis not present

## 2018-09-22 DIAGNOSIS — R7303 Prediabetes: Secondary | ICD-10-CM | POA: Diagnosis not present

## 2018-09-27 DIAGNOSIS — L538 Other specified erythematous conditions: Secondary | ICD-10-CM | POA: Diagnosis not present

## 2018-09-27 DIAGNOSIS — D2271 Melanocytic nevi of right lower limb, including hip: Secondary | ICD-10-CM | POA: Diagnosis not present

## 2018-09-27 DIAGNOSIS — L820 Inflamed seborrheic keratosis: Secondary | ICD-10-CM | POA: Diagnosis not present

## 2018-09-28 DIAGNOSIS — R7303 Prediabetes: Secondary | ICD-10-CM | POA: Diagnosis not present

## 2018-09-28 DIAGNOSIS — Z8639 Personal history of other endocrine, nutritional and metabolic disease: Secondary | ICD-10-CM | POA: Diagnosis not present

## 2018-09-28 DIAGNOSIS — M71342 Other bursal cyst, left hand: Secondary | ICD-10-CM | POA: Diagnosis not present

## 2018-09-28 DIAGNOSIS — J4520 Mild intermittent asthma, uncomplicated: Secondary | ICD-10-CM | POA: Diagnosis not present

## 2018-09-28 DIAGNOSIS — E7800 Pure hypercholesterolemia, unspecified: Secondary | ICD-10-CM | POA: Diagnosis not present

## 2018-10-10 DIAGNOSIS — Z4789 Encounter for other orthopedic aftercare: Secondary | ICD-10-CM | POA: Insufficient documentation

## 2018-10-25 DIAGNOSIS — E7800 Pure hypercholesterolemia, unspecified: Secondary | ICD-10-CM | POA: Diagnosis not present

## 2018-10-25 DIAGNOSIS — R7303 Prediabetes: Secondary | ICD-10-CM | POA: Diagnosis not present

## 2018-10-25 DIAGNOSIS — I471 Supraventricular tachycardia: Secondary | ICD-10-CM | POA: Diagnosis not present

## 2018-12-08 DIAGNOSIS — Z8639 Personal history of other endocrine, nutritional and metabolic disease: Secondary | ICD-10-CM | POA: Diagnosis not present

## 2018-12-13 MED FILL — METOPROLOL SUCCINATE ER 50: 50 | 90 days supply | Qty: 90 | Fill #0

## 2018-12-13 MED FILL — IMIPRAMINE HCL 25 MG TABLET: 25 | 90 days supply | Qty: 180 | Fill #0

## 2018-12-18 MED FILL — LEVOTHYROXINE 88 MCG TABLET: 88 | 30 days supply | Qty: 30 | Fill #0

## 2018-12-20 ENCOUNTER — Other Ambulatory Visit: Payer: Self-pay

## 2018-12-20 ENCOUNTER — Encounter: Payer: Self-pay | Admitting: Obstetrics and Gynecology

## 2018-12-20 ENCOUNTER — Ambulatory Visit (INDEPENDENT_AMBULATORY_CARE_PROVIDER_SITE_OTHER): Payer: 59 | Admitting: Obstetrics and Gynecology

## 2018-12-20 VITALS — BP 133/81 | HR 86 | Ht 62.0 in | Wt 130.6 lb

## 2018-12-20 DIAGNOSIS — N63 Unspecified lump in unspecified breast: Secondary | ICD-10-CM

## 2018-12-20 DIAGNOSIS — Z803 Family history of malignant neoplasm of breast: Secondary | ICD-10-CM

## 2018-12-20 NOTE — Progress Notes (Signed)
Pt is present today due to finding a nodule in her right breast x 2 weeks.

## 2018-12-20 NOTE — Patient Instructions (Signed)
Breast Self-Awareness Breast self-awareness means:  Knowing how your breasts look.  Knowing how your breasts feel.  Checking your breasts every month for changes.  Telling your doctor if you notice a change in your breasts. Breast self-awareness allows you to notice a breast problem early while it is still small. How to do a breast self-exam One way to learn what is normal for your breasts and to check for changes is to do a breast self-exam. To do a breast self-exam: Look for Changes  1. Take off all the clothes above your waist. 2. Stand in front of a mirror in a room with good lighting. 3. Put your hands on your hips. 4. Push your hands down. 5. Look at your breasts and nipples in the mirror to see if one breast or nipple looks different than the other. Check to see if: ? The shape of one breast is different. ? The size of one breast is different. ? There are wrinkles, dips, and bumps in one breast and not the other. 6. Look at each breast for changes in your skin, such as: ? Redness. ? Scaly areas. 7. Look for changes in your nipples, such as: ? Liquid around the nipples. ? Bleeding. ? Dimpling. ? Redness. ? A change in where the nipples are. Feel for Changes 1. Lie on your back on the floor. 2. Feel each breast. To do this, follow these steps: ? Pick a breast to feel. ? Put the arm closest to that breast above your head. ? Use your other arm to feel the nipple area of your breast. Feel the area with the pads of your three middle fingers by making small circles with your fingers. For the first circle, press lightly. For the second circle, press harder. For the third circle, press even harder. ? Keep making circles with your fingers at the light, harder, and even harder pressures as you move down your breast. Stop when you feel your ribs. ? Move your fingers a little toward the center of your body. ? Start making circles with your fingers again, this time going up until you  reach your collarbone. ? Keep making up and down circles until you reach your armpit. Remember to keep using the three pressures. ? Feel the other breast in the same way. 3. Sit or stand in the shower or tub. 4. With soapy water on your skin, feel each breast the same way you did in step 2, when you were lying on the floor. Write Down What You Find After doing the self-exam, write down:  What is normal for each breast.  Any changes you find in each breast.  When you last had your period.  How often should I check my breasts? Check your breasts every month. If you are breastfeeding, the best time to check them is after you feed your baby or after you use a breast pump. If you get periods, the best time to check your breasts is 5-7 days after your period is over. When should I see my doctor? See your doctor if you notice:  A change in shape or size of your breasts or nipples.  A change in the skin of your breast or nipples, such as red or scaly skin.  Unusual fluid coming from your nipples.  A lump or thick area that was not there before.  Pain in your breasts.  Anything that concerns you. This information is not intended to replace advice given to you by your  of your breast or nipples, such as red or scaly skin.  · Unusual fluid coming from your nipples.  · A lump or thick area that was not there before.  · Pain in your breasts.  · Anything that concerns you.  This information is not intended to replace advice given to you by your health care provider. Make sure you discuss any questions you have with your health care provider.  Document Released: 01/26/2008 Document Revised: 01/15/2016 Document Reviewed: 06/29/2015  Elsevier Interactive Patient Education © 2019 [employer]

## 2018-12-20 NOTE — Progress Notes (Signed)
GYNECOLOGY PROGRESS NOTE  Subjective:    Patient ID: [name], female    DOB: May 13, 1961, 58 y.o.   MRN: [id number]  HPI  Patient is a 58 y.o. [id number] female who presents for complaints of a left breast nodule for 2 weeks.  She notes that the lump is on the underside of her breasts and is approximately the size of a small pea.  The nodule has not changed in size over the past several weeks, and she denies any nipple discharge or breast tenderness or redness in the area.  Expresses concern due to her family history of breast cancer.  Her last mammogram was in 2009, and was normal.  She performs monthly breast exams.  She is a previous patient of Dr. Hassell Done Defrancesco, who has retired.  The following portions of the patient's history were reviewed and updated as appropriate:   She  has a past medical history of Allergy, Arthritis, Asthma, Chicken pox, Chronic UTI, Cystocele, Endometriosis, Family history of polyps in the colon, Graves disease, History of heavy periods, Hyperlipemia, IBS (irritable bowel syndrome), Perimenopausal, PONV (postoperative nausea and vomiting), Rectocele, S/P endometrial ablation, Scoliosis, Skin cancer, SUI (stress urinary incontinence, female), SVT (supraventricular tachycardia) ([place]), Urine incontinence, and Wears contact lenses.   She  has a past surgical history that includes Thyroidectomy, partial; Eye surgery; Tonsillectomy and adenoidectomy; tubes in ears; laprascopic surgery; endometrrial ablation; knee athroscopy (Left, 2013); Nasopharyngoscopy eustation tube balloon dilation (Bilateral, 05/18/2018); Nasopharyngoscopy eustation tube balloon dilation; nova-sure; and Colonoscopy with propofol (N/A, 07/10/2018).   Her family history includes Breast cancer (age of onset: 5) in her paternal aunt; Breast cancer (age of onset: 17) in her mother; Breast cancer (age of onset: 24) in her maternal aunt; Cancer in her father and mother; Heart disease in her father  and mother; Hyperlipidemia in her father and mother; Hypertension in her father and mother; Kidney failure in her mother; Stroke in her mother.  She reports that she has never smoked. She has never used smokeless tobacco. She reports that she does not drink alcohol or use drugs.   She has a current medication list which includes the following prescription(s): albuterol, aspirin, cholecalciferol, cranberry, epipen 2-pak, fexofenadine, fluticasone, imipramine, levothyroxine, levothyroxine, lovastatin, metoprolol succinate, montelukast, multi-vitamins, and nitrofurantoin (macrocrystal-monohydrate).   She is allergic to amoxicillin; bextra [valdecoxib]; ciprofloxacin; floxin [ofloxacin]; gyne-lotrimin [clotrimazole]; levofloxacin; penicillin v potassium; quinolones; septra [sulfamethoxazole-trimethoprim]; sulfa antibiotics; and penicillins..  Review of Systems Pertinent items noted in HPI and remainder of comprehensive ROS otherwise negative.   Objective:   Blood pressure 133/81, pulse 86, height 5\' 2"  (1.575 m), weight 130 lb 9.6 oz (59.2 kg), last menstrual period 07/01/2014. General appearance: alert and no distress Breasts: right breast normal appearing without mass, skin or nipple changes or axillary nodes.  Left breast normal appearing, with small palpable nodule approximately pea-sized at 7-8 o'clock approximately 2 cm below areola, slightly mobile and nontender, without skin or nipple changes or axillary nodes.    Assessment:   Left breast nodule Family history of breast cancer  Plan:   1.  Patient with left breast nodule in the setting of a family history of breast cancer in several family members including her mother.  Although patient has had a normal mammogram in September of last year and nodule does feel benign, I would recommend that she have a diagnostic mammogram and possible ultrasound in order to further assess the breast nodule.  To follow-up as needed   Rubie Maid,  MD [facility]

## 2018-12-27 NOTE — Addendum Note (Signed)
Addended by: Edrick Oh J on: 12/27/2018 11:20 AM   Modules accepted: Orders

## 2018-12-27 NOTE — Addendum Note (Signed)
Addended by: Edrick Oh J on: 12/27/2018 11:26 AM   Modules accepted: Orders

## 2019-01-03 ENCOUNTER — Ambulatory Visit
Admission: RE | Admit: 2019-01-03 | Discharge: 2019-01-03 | Disposition: A | Discharge status: Home / Self Care | Payer: 59 | Source: Ambulatory Visit | Attending: Obstetrics and Gynecology | Admitting: Obstetrics and Gynecology

## 2019-01-03 ENCOUNTER — Other Ambulatory Visit: Payer: 59

## 2019-01-03 ENCOUNTER — Other Ambulatory Visit: Payer: Self-pay

## 2019-01-03 DIAGNOSIS — N63 Unspecified lump in unspecified breast: Secondary | ICD-10-CM

## 2019-01-03 DIAGNOSIS — Z803 Family history of malignant neoplasm of breast: Secondary | ICD-10-CM | POA: Diagnosis not present

## 2019-01-03 DIAGNOSIS — N6320 Unspecified lump in the left breast, unspecified quadrant: Secondary | ICD-10-CM | POA: Diagnosis not present

## 2019-01-03 DIAGNOSIS — R922 Inconclusive mammogram: Secondary | ICD-10-CM | POA: Diagnosis not present

## 2019-01-17 ENCOUNTER — Encounter: Payer: 59 | Admitting: Obstetrics and Gynecology

## 2019-04-06 DIAGNOSIS — R7303 Prediabetes: Secondary | ICD-10-CM | POA: Diagnosis not present

## 2019-04-06 DIAGNOSIS — E7800 Pure hypercholesterolemia, unspecified: Secondary | ICD-10-CM | POA: Diagnosis not present

## 2019-04-11 DIAGNOSIS — R7303 Prediabetes: Secondary | ICD-10-CM | POA: Diagnosis not present

## 2019-04-11 DIAGNOSIS — E7800 Pure hypercholesterolemia, unspecified: Secondary | ICD-10-CM | POA: Diagnosis not present

## 2019-04-11 DIAGNOSIS — Z8639 Personal history of other endocrine, nutritional and metabolic disease: Secondary | ICD-10-CM | POA: Diagnosis not present

## 2019-04-11 DIAGNOSIS — Z0000 Encounter for general adult medical examination without abnormal findings: Secondary | ICD-10-CM | POA: Diagnosis not present

## 2019-04-18 DIAGNOSIS — D2371 Other benign neoplasm of skin of right lower limb, including hip: Secondary | ICD-10-CM | POA: Diagnosis not present

## 2019-04-18 DIAGNOSIS — R58 Hemorrhage, not elsewhere classified: Secondary | ICD-10-CM | POA: Diagnosis not present

## 2019-04-18 DIAGNOSIS — L821 Other seborrheic keratosis: Secondary | ICD-10-CM | POA: Diagnosis not present

## 2019-04-18 DIAGNOSIS — Z85828 Personal history of other malignant neoplasm of skin: Secondary | ICD-10-CM | POA: Diagnosis not present

## 2019-04-18 DIAGNOSIS — L820 Inflamed seborrheic keratosis: Secondary | ICD-10-CM | POA: Diagnosis not present

## 2019-04-18 DIAGNOSIS — L538 Other specified erythematous conditions: Secondary | ICD-10-CM | POA: Diagnosis not present

## 2019-04-18 DIAGNOSIS — Z08 Encounter for follow-up examination after completed treatment for malignant neoplasm: Secondary | ICD-10-CM | POA: Diagnosis not present

## 2019-04-25 ENCOUNTER — Encounter: Payer: Self-pay | Admitting: Obstetrics and Gynecology

## 2019-04-25 ENCOUNTER — Ambulatory Visit (INDEPENDENT_AMBULATORY_CARE_PROVIDER_SITE_OTHER): Payer: 59 | Admitting: Obstetrics and Gynecology

## 2019-04-25 ENCOUNTER — Other Ambulatory Visit: Payer: Self-pay

## 2019-04-25 ENCOUNTER — Encounter: Payer: 59 | Admitting: Obstetrics and Gynecology

## 2019-04-25 VITALS — BP 112/71 | HR 71 | Ht 62.0 in | Wt 129.6 lb

## 2019-04-25 DIAGNOSIS — Z01419 Encounter for gynecological examination (general) (routine) without abnormal findings: Secondary | ICD-10-CM | POA: Diagnosis not present

## 2019-04-25 DIAGNOSIS — N951 Menopausal and female climacteric states: Secondary | ICD-10-CM | POA: Diagnosis not present

## 2019-04-25 DIAGNOSIS — R7303 Prediabetes: Secondary | ICD-10-CM | POA: Diagnosis not present

## 2019-04-25 DIAGNOSIS — Z803 Family history of malignant neoplasm of breast: Secondary | ICD-10-CM

## 2019-04-25 DIAGNOSIS — Z1231 Encounter for screening mammogram for malignant neoplasm of breast: Secondary | ICD-10-CM | POA: Diagnosis not present

## 2019-04-25 DIAGNOSIS — N63 Unspecified lump in unspecified breast: Secondary | ICD-10-CM | POA: Diagnosis not present

## 2019-04-25 NOTE — Progress Notes (Signed)
ANNUAL PREVENTATIVE CARE GYNECOLOGY  ENCOUNTER NOTE  Subjective:       [name] is a 58 y.o. G10P3003 female here for a routine annual gynecologic exam. The patient is sexually active. The patient has never taken hormone replacement therapy. Patient denies post-menopausal vaginal bleeding. The patient wears seatbelts: no. The patient participates in regular exercise: yes (5 x weekly, runs/jogs). Has the patient ever been transfused or tattooed?: no. The patient reports that there is not domestic violence in her life.  Current complaints: 1.  Does complain about hot flushes, mostly after drinking coffee or while wearing her face mask.     Gynecologic History Patient's last menstrual period was 07/01/2014 (exact date). Contraception: post menopausal status Last Pap: 03/2017. Results were: normal Last mammogram: 05/10/2018. Results were: normal. Patient had breast lump in left breast, had diagnostic mammogram with ultrasound5/13/2020, normal. Recommend f/u routine screening.  Last Colonoscopy: 06/2018.  Results were normal. Repeat q 5 years due to polyps and family history of colon cancer (dad)   Obstetric History OB History  Gravida Para Term Preterm AB Living  3 3 3     3   SAB TAB Ectopic Multiple Live Births          3    # Outcome Date GA Lbr Len/2nd Weight Sex Delivery Anes PTL Lv  3 Term 08/08/96    F CS-Unspec   LIV  2 Term 08/23/95    M CS-Unspec   LIV  1 Term 10/25/93    M CS-Unspec   LIV    Past Medical History:  Diagnosis Date  . Allergy   . Arthritis    osteoarthritis - fingers  . Asthma   . Chicken pox   . Chronic UTI   . Cystocele    2nd degree  . Endometriosis   . Family history of polyps in the colon   . Graves disease   . History of heavy periods   . Hyperlipemia   . IBS (irritable bowel syndrome)   . Perimenopausal   . PONV (postoperative nausea and vomiting)    nausea only  . Rectocele    mild  . S/P endometrial ablation   . Scoliosis   .  Skin cancer   . SUI (stress urinary incontinence, female)   . SVT (supraventricular tachycardia) ([place])   . Urine incontinence   . Wears contact lenses     Family History  Problem Relation Age of Onset  . Cancer Mother   . Hyperlipidemia Mother   . Heart disease Mother   . Hypertension Mother   . Breast cancer Mother 84  . Kidney failure Mother   . Stroke Mother   . Hyperlipidemia Father   . Heart disease Father   . Hypertension Father   . Cancer Father        colon  . Breast cancer Paternal Aunt 64  . Breast cancer Maternal Aunt 85  . Kidney cancer Neg Hx   . Bladder Cancer Neg Hx   . Prostate cancer Neg Hx   . Ovarian cancer Neg Hx   . Diabetes Neg Hx     Past Surgical History:  Procedure Laterality Date  . COLONOSCOPY WITH PROPOFOL N/A 07/10/2018   Procedure: COLONOSCOPY WITH PROPOFOL;  Surgeon: Manya Silvas, MD;  Location: [facility] ENDOSCOPY;  Service: Endoscopy;  Laterality: N/A;  . endometrrial ablation    . EYE SURGERY    . knee athroscopy Left 2013  . laprascopic surgery  endometreosis  . NASOPHARYNGOSCOPY EUSTATION TUBE BALLOON DILATION Bilateral 05/18/2018   Procedure: NASOPHARYNGOSCOPY EUSTATION TUBE BALLOON DILATION OUTFRACTURE BILATERAL INFERIOR TURBINATES;  Surgeon: Margaretha Sheffield, MD;  Location: [facility];  Service: ENT;  Laterality: Bilateral;  . NASOPHARYNGOSCOPY EUSTATION TUBE BALLOON DILATION    . nova-sure    . THYROIDECTOMY, PARTIAL    . TONSILLECTOMY AND ADENOIDECTOMY    . tubes in ears      Social History   Socioeconomic History  . Marital status: Married    Spouse name: Not on file  . Number of children: Not on file  . Years of education: Not on file  . Highest education level: Not on file  Occupational History  . Occupation: [employer]  . Financial resource strain: Not on file  . Food insecurity    Worry: Not on file    Inability: Not on file  . Transportation needs    Medical: Not on file     Non-medical: Not on file  Tobacco Use  . Smoking status: Never Smoker  . Smokeless tobacco: Never Used  Substance and Sexual Activity  . Alcohol use: No    Alcohol/week: 0.0 standard drinks  . Drug use: No  . Sexual activity: Yes    Birth control/protection: Post-menopausal  Lifestyle  . Physical activity    Days per week: 4 days    Minutes per session: 40 min  . Stress: Not on file  Relationships  . Social Herbalist on phone: Not on file    Gets together: Not on file    Attends religious service: Not on file    Active member of club or organization: Not on file    Attends meetings of clubs or organizations: Not on file    Relationship status: Not on file  . Intimate partner violence    Fear of current or ex partner: Not on file    Emotionally abused: Not on file    Physically abused: Not on file    Forced sexual activity: Not on file  Other Topics Concern  . Not on file  Social History Narrative  . Not on file    Current Outpatient Medications on File Prior to Visit  Medication Sig Dispense Refill  . albuterol (PROVENTIL HFA;VENTOLIN HFA) 108 (90 BASE) MCG/ACT inhaler Inhale 2 puffs into the lungs every 6 (six) hours as needed for wheezing or shortness of breath.    Marland Kitchen aspirin 81 MG tablet Take 81 mg by mouth daily.    . cholecalciferol (VITAMIN D) 1000 UNITS tablet Take 1,000 Units by mouth daily.    Marland Kitchen CRANBERRY PO Take by mouth.    . EPIPEN 2-PAK 0.3 MG/0.3ML SOAJ injection USE FOR ANAPHYLAXIS 2 Device 3  . fexofenadine (ALLEGRA ALLERGY) 180 MG tablet Allegra Allergy 180 mg tablet  Take 1 tablet every day by oral route.    . fluticasone (FLONASE) 50 MCG/ACT nasal spray Place 1 spray into both nostrils daily as needed.     Marland Kitchen imipramine (TOFRANIL) 25 MG tablet Take 50 mg by mouth at bedtime.    Marland Kitchen levothyroxine (SYNTHROID) 88 MCG tablet Take 88 mcg by mouth daily before breakfast. Take one tablet in the morning M-F    . levothyroxine (SYNTHROID, LEVOTHROID) 75  MCG tablet Take 75 mcg by mouth daily before breakfast. Take 55mcg only on Saturdays and Sundays    . lovastatin (MEVACOR) 20 MG tablet Take 20 mg by mouth at bedtime.    Marland Kitchen  metoprolol succinate (TOPROL-XL) 50 MG 24 hr tablet Take 50 mg by mouth daily. Take with or immediately following a meal.    . montelukast (SINGULAIR) 10 MG tablet Take 10 mg by mouth at bedtime.    . Multiple Vitamin (MULTI-VITAMINS) TABS Take by mouth.    . nitrofurantoin, macrocrystal-monohydrate, (MACROBID) 100 MG capsule Take one after intercourse (Patient taking differently: 2 (two) times daily as needed. Take one after intercourse) 90 capsule 0   No current facility-administered medications on file prior to visit.     Allergies  Allergen Reactions  . Amoxicillin   . Bextra [Valdecoxib]   . Ciprofloxacin   . Floxin [Ofloxacin]   . Gyne-Lotrimin [Clotrimazole]   . Levofloxacin   . Penicillin V Potassium Swelling    Caused fever and swelling  . Quinolones Other (See Comments)  . Septra [Sulfamethoxazole-Trimethoprim] Other (See Comments)    Flushed and fever.  . Sulfa Antibiotics Other (See Comments)    fever  . Penicillins Rash     Review of Systems ROS Review of Systems - General ROS: negative for - chills, fatigue, fever, hot flashes, night sweats, weight gain or weight loss Psychological ROS: negative for - anxiety, decreased libido, depression, mood swings, physical abuse or sexual abuse Ophthalmic ROS: negative for - blurry vision, eye pain or loss of vision ENT ROS: negative for - headaches, hearing change, visual changes or vocal changes Allergy and Immunology ROS: negative for - hives, itchy/watery eyes or seasonal allergies Hematological and Lymphatic ROS: negative for - bleeding problems, bruising, swollen lymph nodes or weight loss Endocrine ROS: negative for - galactorrhea, hair pattern changes, hot flashes, malaise/lethargy, mood swings, palpitations, polydipsia/polyuria, skin changes,  temperature intolerance or unexpected weight changes Breast ROS: negative for - new or changing breast lumps (however notes she still feels previous left breast lump in LLQ). Is still or nipple discharge Respiratory ROS: negative for - cough or shortness of breath Cardiovascular ROS: negative for - chest pain, irregular heartbeat, palpitations or shortness of breath Gastrointestinal ROS: no abdominal pain, change in bowel habits, or black or bloody stools Genito-Urinary ROS: no dysuria, trouble voiding, or hematuria Musculoskeletal ROS: negative for - joint pain or joint stiffness Neurological ROS: negative for - bowel and bladder control changes Dermatological ROS: negative for rash and skin lesion changes   Objective:   BP 112/71   Pulse 71   Ht 5\' 2"  (1.575 m)   Wt 129 lb 9.6 oz (58.8 kg)   LMP 07/01/2014 (Exact Date)   BMI 23.70 kg/m  CONSTITUTIONAL: Well-developed, well-nourished female in no acute distress.  PSYCHIATRIC: Normal mood and affect. Normal behavior. Normal judgment and thought content. [place]: Alert and oriented to person, place, and time. Normal muscle tone coordination. No cranial nerve deficit noted. HENT:  Normocephalic, atraumatic, External right and left ear normal. Oropharynx is clear and moist EYES: Conjunctivae and EOM are normal. Pupils are equal, round, and reactive to light. No scleral icterus.  NECK: Normal range of motion, supple, no masses.  Normal thyroid.  SKIN: Skin is warm and dry. No rash noted. Not diaphoretic. No erythema. No pallor. CARDIOVASCULAR: Normal heart rate noted, regular rhythm, no murmur. RESPIRATORY: Clear to auscultation bilaterally. Effort and breath sounds normal, no problems with respiration noted. BREASTS: Symmetric in size. No , skin changes, nipple drainage, or lymphadenopathy.   Left breast small palpable nodule approximately pea-sized at 7-8 o'clock approximately 2 cm below areola, slightly mobile and nontender,  ABDOMEN:  Soft, normal bowel sounds, no  distention noted.  No tenderness, rebound or guarding.  BLADDER: Normal PELVIC:  Bladder no bladder distension noted  Urethra: normal appearing urethra with no masses, tenderness or lesions  Vulva: normal appearing vulva with no masses, tenderness or lesions  Vagina: normal appearing vagina with normal color and discharge, no lesions  Cervix: normal appearing cervix without discharge or lesions  Uterus: uterus is normal size, shape, consistency and nontender  Adnexa: normal adnexa in size, nontender and no masses  RV: External Exam NormaI, No Rectal Masses and Normal Sphincter tone  MUSCULOSKELETAL: Normal range of motion. No tenderness.  No cyanosis, clubbing, or edema.  2+ distal pulses. LYMPHATIC: No Axillary, Supraclavicular, or Inguinal Adenopathy.   Labs: Labs done by P[facility] at [facility] in July, reviewed in Care everywhere  Assessment:   Encounter for well woman exam with routine gynecological exam Prediabetes Breast cancer screening by mammogram Vasomotor symptoms due to menopause Breast nodule Family history of breast cancer  Plan:  Pap: Not done. Up to date.  Mammogram: Ordered.  Stool Guaiac Testing:  Not Ordered.  Up to date with colonoscopy.  Labs: Reviewed in Care Everywhere.  All labs normal except prediabetic (HgbA1c 5.9).  Routine preventative health maintenance measures emphasized: Exercise/Diet/Weight control, Tobacco Warnings, Alcohol/Substance use risks, Stress Management, Peer Pressure Issues and Safe Sex Menopausal vasomotor symptoms mild, lifestyle interventions such as wearing light clothing, remaining in cool environments, having fan/air conditioner in the room, avoiding hot beverages etc.  Declines hormonal therapy at this time. May consider herbal remedies if symptoms worsen.  Return to [place], MD [facility] Care

## 2019-04-25 NOTE — Patient Instructions (Addendum)
Health Maintenance for Postmenopausal Women Menopause is a normal process in which your ability to get pregnant comes to an end. This process happens slowly over many months or years, usually between the ages of 48 and 55. Menopause is complete when you have missed your menstrual periods for 12 months. It is important to talk with your health care provider about some of the most common conditions that affect women after menopause (postmenopausal women). These include heart disease, cancer, and bone loss (osteoporosis). Adopting a healthy lifestyle and getting preventive care can help to promote your health and wellness. The actions you take can also lower your chances of developing some of these common conditions. What should I know about menopause? During menopause, you may get a number of symptoms, such as:  Hot flashes. These can be moderate or severe.  Night sweats.  Decrease in sex drive.  Mood swings.  Headaches.  Tiredness.  Irritability.  Memory problems.  Insomnia. Choosing to treat or not to treat these symptoms is a decision that you make with your health care provider. Do I need hormone replacement therapy?  Hormone replacement therapy is effective in treating symptoms that are caused by menopause, such as hot flashes and night sweats.  Hormone replacement carries certain risks, especially as you become older. If you are thinking about using estrogen or estrogen with progestin, discuss the benefits and risks with your health care provider. What is my risk for heart disease and stroke? The risk of heart disease, heart attack, and stroke increases as you age. One of the causes may be a change in the body's hormones during menopause. This can affect how your body uses dietary fats, triglycerides, and cholesterol. Heart attack and stroke are medical emergencies. There are many things that you can do to help prevent heart disease and stroke. Watch your blood pressure  High  blood pressure causes heart disease and increases the risk of stroke. This is more likely to develop in people who have high blood pressure readings, are of African descent, or are overweight.  Have your blood pressure checked: ? Every 3-5 years if you are 18-39 years of age. ? Every year if you are 40 years old or older. Eat a healthy diet   Eat a diet that includes plenty of vegetables, fruits, low-fat dairy products, and lean protein.  Do not eat a lot of foods that are high in solid fats, added sugars, or sodium. Get regular exercise Get regular exercise. This is one of the most important things you can do for your health. Most adults should:  Try to exercise for at least 150 minutes each week. The exercise should increase your heart rate and make you sweat (moderate-intensity exercise).  Try to do strengthening exercises at least twice each week. Do these in addition to the moderate-intensity exercise.  Spend less time sitting. Even light physical activity can be beneficial. Other tips  Work with your health care provider to achieve or maintain a healthy weight.  Do not use any products that contain nicotine or tobacco, such as cigarettes, e-cigarettes, and chewing tobacco. If you need help quitting, ask your health care provider.  Know your numbers. Ask your health care provider to check your cholesterol and your blood sugar (glucose). Continue to have your blood tested as directed by your health care provider. Do I need screening for cancer? Depending on your health history and family history, you may need to have cancer screening at different stages of your life. This   may include screening for:  Breast cancer.  Cervical cancer.  Lung cancer.  Colorectal cancer. What is my risk for osteoporosis? After menopause, you may be at increased risk for osteoporosis. Osteoporosis is a condition in which bone destruction happens more quickly than new bone creation. To help prevent  osteoporosis or the bone fractures that can happen because of osteoporosis, you may take the following actions:  If you are 19-50 years old, get at least 1,000 mg of calcium and at least 600 mg of vitamin D per day.  If you are older than age 50 but younger than age 70, get at least 1,200 mg of calcium and at least 600 mg of vitamin D per day.  If you are older than age 70, get at least 1,200 mg of calcium and at least 800 mg of vitamin D per day. Smoking and drinking excessive alcohol increase the risk of osteoporosis. Eat foods that are rich in calcium and vitamin D, and do weight-bearing exercises several times each week as directed by your health care provider. How does menopause affect my mental health? Depression may occur at any age, but it is more common as you become older. Common symptoms of depression include:  Low or sad mood.  Changes in sleep patterns.  Changes in appetite or eating patterns.  Feeling an overall lack of motivation or enjoyment of activities that you previously enjoyed.  Frequent crying spells. Talk with your health care provider if you think that you are experiencing depression. General instructions See your health care provider for regular wellness exams and vaccines. This may include:  Scheduling regular health, dental, and eye exams.  Getting and maintaining your vaccines. These include: ? Influenza vaccine. Get this vaccine each year before the flu season begins. ? Pneumonia vaccine. ? Shingles vaccine. ? Tetanus, diphtheria, and pertussis (Tdap) booster vaccine. Your health care provider may also recommend other immunizations. Tell your health care provider if you have ever been abused or do not feel safe at home. Summary  Menopause is a normal process in which your ability to get pregnant comes to an end.  This condition causes hot flashes, night sweats, decreased interest in sex, mood swings, headaches, or lack of sleep.  Treatment for this  condition may include hormone replacement therapy.  Take actions to keep yourself healthy, including exercising regularly, eating a healthy diet, watching your weight, and checking your blood pressure and blood sugar levels.  Get screened for cancer and depression. Make sure that you are up to date with all your vaccines. This information is not intended to replace advice given to you by your health care provider. Make sure you discuss any questions you have with your health care provider. Document Released: 10/01/2005 Document Revised: 08/02/2018 Document Reviewed: 08/02/2018 Elsevier Patient Education  2020 Elsevier Inc.   Breast Self-Awareness Breast self-awareness is knowing how your breasts look and feel. Doing breast self-awareness is important. It allows you to catch a breast problem early while it is still small and can be treated. All women should do breast self-awareness, including women who have had breast implants. Tell your doctor if you notice a change in your breasts. What you need:  A mirror.  A well-lit room. How to do a breast self-exam A breast self-exam is one way to learn what is normal for your breasts and to check for changes. To do a breast self-exam: Look for changes  1. Take off all the clothes above your waist. 2. Stand in   front of a mirror in a room with good lighting. 3. Put your hands on your hips. 4. Push your hands down. 5. Look at your breasts and nipples in the mirror to see if one breast or nipple looks different from the other. Check to see if: ? The shape of one breast is different. ? The size of one breast is different. ? There are wrinkles, dips, and bumps in one breast and not the other. 6. Look at each breast for changes in the skin, such as: ? Redness. ? Scaly areas. 7. Look for changes in your nipples, such as: ? Liquid around the nipples. ? Bleeding. ? Dimpling. ? Redness. ? A change in where the nipples are. Feel for  changes  1. Lie on your back on the floor. 2. Feel each breast. To do this, follow these steps: ? Pick a breast to feel. ? Put the arm closest to that breast above your head. ? Use your other arm to feel the nipple area of your breast. Feel the area with the pads of your three middle fingers by making small circles with your fingers. For the first circle, press lightly. For the second circle, press harder. For the third circle, press even harder. ? Keep making circles with your fingers at the different pressures as you move down your breast. Stop when you feel your ribs. ? Move your fingers a little toward the center of your body. ? Start making circles with your fingers again, this time going up until you reach your collarbone. ? Keep making up-and-down circles until you reach your armpit. Remember to keep using the three pressures. ? Feel the other breast in the same way. 3. Sit or stand in the tub or shower. 4. With soapy water on your skin, feel each breast the same way you did in step 2 when you were lying on the floor. Write down what you find Writing down what you find can help you remember what to tell your doctor. Write down:  What is normal for each breast.  Any changes you find in each breast, including: ? The kind of changes you find. ? Whether you have pain. ? Size and location of any lumps.  When you last had your menstrual period. General tips  Check your breasts every month.  If you are breastfeeding, the best time to check your breasts is after you feed your baby or after you use a breast pump.  If you get menstrual periods, the best time to check your breasts is 5-7 days after your menstrual period is over.  With time, you will become comfortable with the self-exam, and you will begin to know if there are changes in your breasts. Contact a doctor if you:  See a change in the shape or size of your breasts or nipples.  See a change in the skin of your breast or  nipples, such as red or scaly skin.  Have fluid coming from your nipples that is not normal.  Find a lump or thick area that was not there before.  Have pain in your breasts.  Have any concerns about your breast health. Summary  Breast self-awareness includes looking for changes in your breasts, as well as feeling for changes within your breasts.  Breast self-awareness should be done in front of a mirror in a well-lit room.  You should check your breasts every month. If you get menstrual periods, the best time to check your breasts is 5-7 days   after your menstrual period is over.  Let your doctor know of any changes you see in your breasts, including changes in size, changes on the skin, pain or tenderness, or fluid from your nipples that is not normal. This information is not intended to replace advice given to you by your health care provider. Make sure you discuss any questions you have with your health care provider. Document Released: 01/26/2008 Document Revised: 03/28/2018 Document Reviewed: 03/28/2018 Elsevier Patient Education  2020 Elsevier Inc.  

## 2019-04-25 NOTE — Progress Notes (Signed)
Pt is present for annual exam. Pt stated that she was doing well no problems. Pt's last pap 04/13/17 normal; last mamm 01/03/19 normal.

## 2019-05-07 ENCOUNTER — Telehealth: Payer: 59

## 2019-05-07 ENCOUNTER — Other Ambulatory Visit: Payer: Self-pay

## 2019-05-07 ENCOUNTER — Encounter: Payer: Self-pay | Admitting: Emergency Medicine

## 2019-05-07 ENCOUNTER — Ambulatory Visit
Admission: EM | Admit: 2019-05-07 | Discharge: 2019-05-07 | Disposition: A | Discharge status: Home / Self Care | Payer: 59 | Attending: Family Medicine | Admitting: Family Medicine

## 2019-05-07 DIAGNOSIS — H7291 Unspecified perforation of tympanic membrane, right ear: Secondary | ICD-10-CM | POA: Diagnosis not present

## 2019-05-07 DIAGNOSIS — H6500 Acute serous otitis media, unspecified ear: Secondary | ICD-10-CM | POA: Diagnosis not present

## 2019-05-07 DIAGNOSIS — H6021 Malignant otitis externa, right ear: Secondary | ICD-10-CM

## 2019-05-07 MED ORDER — NEOMYCIN-POLYMYXIN-HC 3.5-10000-1 OT SUSP: 4.0000 [drp] | Freq: Three times a day (TID) | OTIC | 0 refills | Status: DC

## 2019-05-07 NOTE — ED Provider Notes (Signed)
[facility]    CSN: [id number] Arrival date & time: 05/07/19  1549      History   Chief Complaint Chief Complaint  Patient presents with  . Otalgia    right    HPI [name] is a 58 y.o. female.   58 yo female with a c/o right ear pain and bloody drainage since early this morning. Denies any injuries, fevers, chills. States she has a h/o chronic ear problems including a perforation to this right ear and has been followed by ENT.   Otalgia   Past Medical History:  Diagnosis Date  . Allergy   . Arthritis    osteoarthritis - fingers  . Asthma   . Chicken pox   . Chronic UTI   . Cystocele    2nd degree  . Endometriosis   . Family history of polyps in the colon   . Graves disease   . History of heavy periods   . Hyperlipemia   . IBS (irritable bowel syndrome)   . Perimenopausal   . PONV (postoperative nausea and vomiting)    nausea only  . Rectocele    mild  . S/P endometrial ablation   . Scoliosis   . Skin cancer   . SUI (stress urinary incontinence, female)   . SVT (supraventricular tachycardia) ([place])   . Urine incontinence   . Wears contact lenses     Patient Active Problem List   Diagnosis Date Noted  . History of Graves' disease 06/14/2018  . Pure hypercholesterolemia 06/14/2018  . Rhinitis 06/14/2018  . Rosacea 06/14/2018  . Hx of adenomatous colonic polyps 05/09/2018  . Midline cystocele 04/19/2018  . Chest pain with low risk for cardiac etiology 10/26/2017  . Borderline diabetes mellitus 11/05/2015  . Chronic UTI 03/21/2015  . SVT (supraventricular tachycardia) ([place]) 03/21/2015  . Endometriosis 03/21/2015  . Asthma 03/21/2015  . Graves disease 03/21/2015  . Irritable bowel syndrome 03/21/2015  . Family history of breast cancer 03/21/2015  . Stool incontinence 03/21/2015  . Gastrocnemius tear 01/18/2014  . Rupture of patellar tendon 01/18/2014  . Bladder infection, chronic 05/24/2012  . Incomplete emptying of bladder  05/24/2012  . Mixed urge and stress incontinence 05/24/2012    Past Surgical History:  Procedure Laterality Date  . COLONOSCOPY WITH PROPOFOL N/A 07/10/2018   Procedure: COLONOSCOPY WITH PROPOFOL;  Surgeon: Manya Silvas, MD;  Location: [facility] ENDOSCOPY;  Service: Endoscopy;  Laterality: N/A;  . endometrrial ablation    . EYE SURGERY    . knee athroscopy Left 2013  . laprascopic surgery     endometreosis  . NASOPHARYNGOSCOPY EUSTATION TUBE BALLOON DILATION Bilateral 05/18/2018   Procedure: NASOPHARYNGOSCOPY EUSTATION TUBE BALLOON DILATION OUTFRACTURE BILATERAL INFERIOR TURBINATES;  Surgeon: Margaretha Sheffield, MD;  Location: [place];  Service: ENT;  Laterality: Bilateral;  . NASOPHARYNGOSCOPY EUSTATION TUBE BALLOON DILATION    . nova-sure    . THYROIDECTOMY, PARTIAL    . TONSILLECTOMY AND ADENOIDECTOMY    . tubes in ears      OB History    Gravida  3   Para  3   Term  3   Preterm      AB      Living  3     SAB      TAB      Ectopic      Multiple      Live Births  3            Home  Medications    Prior to Admission medications   Medication Sig Start Date End Date Taking? Authorizing Provider  albuterol (PROVENTIL HFA;VENTOLIN HFA) 108 (90 BASE) MCG/ACT inhaler Inhale 2 puffs into the lungs every 6 (six) hours as needed for wheezing or shortness of breath.   Yes [provider]  aspirin 81 MG tablet Take 81 mg by mouth daily.   Yes [provider]  cholecalciferol (VITAMIN D) 1000 UNITS tablet Take 1,000 Units by mouth daily.   Yes [provider]  CRANBERRY PO Take by mouth.   Yes [provider]  EPIPEN 2-PAK 0.3 MG/0.3ML SOAJ injection USE FOR ANAPHYLAXIS 11/06/15  Yes Fisher, Linden Dolin, PA-C  fexofenadine [facility] ALLERGY) 180 MG tablet Allegra Allergy 180 mg tablet  Take 1 tablet every day by oral route.   Yes [provider]  fluticasone (FLONASE) 50 MCG/ACT nasal spray Place 1 spray into both  nostrils daily as needed.    Yes [provider]  imipramine (TOFRANIL) 25 MG tablet Take 50 mg by mouth at bedtime.   Yes [provider]  levothyroxine (SYNTHROID) 88 MCG tablet Take 88 mcg by mouth daily before breakfast. Take one tablet in the morning M-F   Yes [provider]  levothyroxine (SYNTHROID, LEVOTHROID) 75 MCG tablet Take 75 mcg by mouth daily before breakfast. Take 21mcg only on Saturdays and Sundays   Yes [provider]  lovastatin (MEVACOR) 20 MG tablet Take 20 mg by mouth at bedtime.   Yes [provider]  metoprolol succinate (TOPROL-XL) 50 MG 24 hr tablet Take 50 mg by mouth daily. Take with or immediately following a meal.   Yes [provider]  Multiple Vitamin (MULTI-VITAMINS) TABS Take by mouth.   Yes [provider]  nitrofurantoin, macrocrystal-monohydrate, (MACROBID) 100 MG capsule Take one after intercourse Patient taking differently: 2 (two) times daily as needed. Take one after intercourse 06/14/18  Yes McGowan, Larene Beach A, PA-C  montelukast (SINGULAIR) 10 MG tablet Take 10 mg by mouth at bedtime.    [provider]  neomycin-polymyxin-hydrocortisone (CORTISPORIN) 3.5-10000-1 OTIC suspension Place 4 drops into the right ear 3 (three) times daily. 05/07/19   Norval Gable, MD    Family History Family History  Problem Relation Age of Onset  . Cancer Mother   . Hyperlipidemia Mother   . Heart disease Mother   . Hypertension Mother   . Breast cancer Mother 47  . Kidney failure Mother   . Stroke Mother   . Hyperlipidemia Father   . Heart disease Father   . Hypertension Father   . Cancer Father        colon  . Breast cancer Paternal Aunt 52  . Breast cancer Maternal Aunt 85  . Kidney cancer Neg Hx   . Bladder Cancer Neg Hx   . Prostate cancer Neg Hx   . Ovarian cancer Neg Hx   . Diabetes Neg Hx     Social History Social History   Tobacco Use  . Smoking status: Never Smoker  .  Smokeless tobacco: Never Used  Substance Use Topics  . Alcohol use: No    Alcohol/week: 0.0 standard drinks  . Drug use: No     Allergies   Amoxicillin, Bextra [valdecoxib], Ciprofloxacin, Floxin [ofloxacin], Gyne-lotrimin [clotrimazole], Levofloxacin, Penicillin v potassium, Quinolones, Septra [sulfamethoxazole-trimethoprim], Sulfa antibiotics, and Penicillins   Review of Systems Review of Systems  HENT: Positive for ear pain.      Physical Exam Triage Vital Signs ED  Triage Vitals  Enc Vitals Group     BP 05/07/19 1614 113/80     Pulse Rate 05/07/19 1614 88     Resp 05/07/19 1614 18     Temp 05/07/19 1614 98.4 F (36.9 C)     Temp Source 05/07/19 1614 Oral     SpO2 05/07/19 1614 99 %     Weight 05/07/19 1611 127 lb (57.6 kg)     Height 05/07/19 1611 5\' 2"  (1.575 m)     Head Circumference --      Peak Flow --      Pain Score 05/07/19 1611 5     Pain Loc --      Pain Edu? --      Excl. in [place]? --    No data found.  Updated Vital Signs BP 113/80 (BP Location: Left Arm)   Pulse 88   Temp 98.4 F (36.9 C) (Oral)   Resp 18   Ht 5\' 2"  (1.575 m)   Wt 57.6 kg   LMP 07/01/2014 (Exact Date)   SpO2 99%   BMI 23.23 kg/m   Visual Acuity Right Eye Distance:   Left Eye Distance:   Bilateral Distance:    Right Eye Near:   Left Eye Near:    Bilateral Near:     Physical Exam Vitals signs and nursing note reviewed.  Constitutional:      General: She is not in acute distress.    Appearance: She is not ill-appearing, toxic-appearing or diaphoretic.  HENT:     Right Ear: Drainage, swelling and tenderness present. Tympanic membrane is perforated and erythematous.     Left Ear: Tympanic membrane normal.     Nose: No congestion or rhinorrhea.  Neurological:     Mental Status: She is alert.      UC Treatments / Results  Labs (all labs ordered are listed, but only abnormal results are displayed) Labs Reviewed - No data to display  EKG   Radiology No results  found.  Procedures Procedures (including critical care time)  Medications Ordered in UC Medications - No data to display  Initial Impression / Assessment and Plan / UC Course  I have reviewed the triage vital signs and the nursing notes.  Pertinent labs & imaging results that were available during my care of the patient were reviewed by me and considered in my medical decision making (see chart for details).      Final Clinical Impressions(s) / UC Diagnoses   Final diagnoses:  Acute malignant otitis externa of right ear  Acute serous otitis media, recurrence not specified, unspecified laterality  Ear drum perforation, right    ED Prescriptions    Medication Sig Dispense Auth. Provider   neomycin-polymyxin-hydrocortisone (CORTISPORIN) 3.5-10000-1 OTIC suspension Place 4 drops into the right ear 3 (three) times daily. 10 mL Norval Gable, MD      1. diagnosis reviewed with patient 2. rx as per orders above; reviewed possible side effects, interactions, risks and benefits  3. Follow-up prn if symptoms worsen or don't improve  Controlled Substance Prescriptions [place] Controlled Substance Registry consulted? Not Applicable   Norval Gable, MD 05/07/19 [phone]

## 2019-05-07 NOTE — ED Triage Notes (Signed)
Pt c/o right ear pain and bloody drainage. Started this morning about 4 am.

## 2019-06-13 ENCOUNTER — Ambulatory Visit
Admission: RE | Admit: 2019-06-13 | Discharge: 2019-06-13 | Disposition: A | Discharge status: Home / Self Care | Payer: 59 | Source: Ambulatory Visit | Attending: Obstetrics and Gynecology | Admitting: Obstetrics and Gynecology

## 2019-06-13 DIAGNOSIS — Z1231 Encounter for screening mammogram for malignant neoplasm of breast: Secondary | ICD-10-CM | POA: Insufficient documentation

## 2019-06-19 NOTE — Progress Notes (Signed)
06/20/2019 1:48 PM   [name] [birth year] [id number]  Referring provider: Dion Body, MD [place] [facility] [place],  [place] [address]  Chief Complaint  Patient presents with  . Over Active Bladder    HPI: 58 yo female with recurrent UTI's, vaginal atrophy, SUI and cystocele for a one year follow up.    rUTI's Risk factors: age, post menopausal state, consumption of surgery drinks, constipation, incontinence and intercourse.  RUS performed on 02/28/2017 was normal.  She had an UTI in October 2019 and August 2020 - for which she took her Macrobid twice daily for 5 days.    Vaginal atrophy Risk factor for rUTI.  Pelvic ultrasound negative in 05/2018.  Endometrial biopsy negative in 05/2018.  Mammogram negative in 05/2019.  Vaginal estrogen cream continues to cause bleeding, so she has discontinued.    SUI Managed with imipramine prescribed by her PCP.  The patient has been experiencing urgency x 0-3 (stable), frequency x 4-7 (stable), not restricting fluids to avoid visits to the restroom, not engaging in toilet mapping, incontinence x 0-3 (stable) and nocturia x 0-3 (stable).  Patient denies any gross hematuria, dysuria or suprapubic/flank pain.  Patient denies any fevers, chills, nausea or vomiting.   Her PVR is 0 mL    Cystocele Patient is having success with imipramine and has had PT.  She is not having any stress incontinence while exercising is able to run 4 times a week.  PMH: Past Medical History:  Diagnosis Date  . Allergy   . Arthritis    osteoarthritis - fingers  . Asthma   . Chicken pox   . Chronic UTI   . Cystocele    2nd degree  . Endometriosis   . Family history of polyps in the colon   . Graves disease   . History of heavy periods   . Hyperlipemia   . IBS (irritable bowel syndrome)   . Perimenopausal   . PONV (postoperative nausea and vomiting)    nausea only  . Rectocele    mild  . S/P endometrial ablation    . Scoliosis   . Skin cancer   . SUI (stress urinary incontinence, female)   . SVT (supraventricular tachycardia) ([place])   . Urine incontinence   . Wears contact lenses     Surgical History: Past Surgical History:  Procedure Laterality Date  . COLONOSCOPY WITH PROPOFOL N/A 07/10/2018   Procedure: COLONOSCOPY WITH PROPOFOL;  Surgeon: Manya Silvas, MD;  Location: [facility] ENDOSCOPY;  Service: Endoscopy;  Laterality: N/A;  . endometrrial ablation    . EYE SURGERY    . knee athroscopy Left 2013  . laprascopic surgery     endometreosis  . NASOPHARYNGOSCOPY EUSTATION TUBE BALLOON DILATION Bilateral 05/18/2018   Procedure: NASOPHARYNGOSCOPY EUSTATION TUBE BALLOON DILATION OUTFRACTURE BILATERAL INFERIOR TURBINATES;  Surgeon: Margaretha Sheffield, MD;  Location: [facility];  Service: ENT;  Laterality: Bilateral;  . NASOPHARYNGOSCOPY EUSTATION TUBE BALLOON DILATION    . nova-sure    . THYROIDECTOMY, PARTIAL    . TONSILLECTOMY AND ADENOIDECTOMY    . tubes in ears      Home Medications:  Allergies as of 06/20/2019      Reactions   Amoxicillin    Bextra [valdecoxib]    Ciprofloxacin    Floxin [ofloxacin]    Gyne-lotrimin [clotrimazole]    Levofloxacin    Quinolones Other (See Comments)   Septra [sulfamethoxazole-trimethoprim] Other (See Comments)   Flushed and fever.  Sulfa Antibiotics Other (See Comments)   fever   Penicillins Rash      Medication List       Accurate as of June 20, 2019  1:48 PM. If you have any questions, ask your nurse or doctor.        albuterol 108 (90 Base) MCG/ACT inhaler Commonly known as: VENTOLIN HFA Inhale 2 puffs into the lungs every 6 (six) hours as needed for wheezing or shortness of breath.   Allegra Allergy 180 MG tablet Generic drug: fexofenadine Allegra Allergy 180 mg tablet  Take 1 tablet every day by oral route.   aspirin 81 MG tablet Take 81 mg by mouth daily.   cholecalciferol 1000 units tablet Commonly known as: VITAMIN D  Take 1,000 Units by mouth daily.   CRANBERRY PO Take by mouth.   EpiPen 2-Pak 0.3 mg/0.3 mL Soaj injection Generic drug: EPINEPHrine USE FOR ANAPHYLAXIS   fluticasone 50 MCG/ACT nasal spray Commonly known as: FLONASE Place 1 spray into both nostrils daily as needed.   imipramine 25 MG tablet Commonly known as: TOFRANIL Take 50 mg by mouth at bedtime.   levothyroxine 75 MCG tablet Commonly known as: SYNTHROID Take 75 mcg by mouth daily before breakfast. Take 47mcg only on Saturdays and Sundays   levothyroxine 88 MCG tablet Commonly known as: SYNTHROID Take 88 mcg by mouth daily before breakfast. Take one tablet in the morning M-F   lovastatin 20 MG tablet Commonly known as: MEVACOR Take 20 mg by mouth at bedtime.   metoprolol succinate 50 MG 24 hr tablet Commonly known as: TOPROL-XL Take 50 mg by mouth daily. Take with or immediately following a meal.   montelukast 10 MG tablet Commonly known as: SINGULAIR Take 10 mg by mouth at bedtime.   Multi-Vitamins Tabs Take by mouth.   neomycin-polymyxin-hydrocortisone 3.5-10000-1 OTIC suspension Commonly known as: CORTISPORIN Place 4 drops into the right ear 3 (three) times daily.   nitrofurantoin (macrocrystal-monohydrate) 100 MG capsule Commonly known as: MACROBID Take 1 capsule (100 mg total) by mouth 2 (two) times daily as needed. Take one after intercourse What changed:   how much to take  how to take this  when to take this  reasons to take this Changed by: Zara Council, PA-C       Allergies:  Allergies  Allergen Reactions  . Amoxicillin   . Bextra [Valdecoxib]   . Ciprofloxacin   . Floxin [Ofloxacin]   . Gyne-Lotrimin [Clotrimazole]   . Levofloxacin   . Quinolones Other (See Comments)  . Septra [Sulfamethoxazole-Trimethoprim] Other (See Comments)    Flushed and fever.  . Sulfa Antibiotics Other (See Comments)    fever  . Penicillins Rash    Family History: Family History  Problem  Relation Age of Onset  . Cancer Mother   . Hyperlipidemia Mother   . Heart disease Mother   . Hypertension Mother   . Breast cancer Mother 43  . Kidney failure Mother   . Stroke Mother   . Hyperlipidemia Father   . Heart disease Father   . Hypertension Father   . Cancer Father        colon  . Breast cancer Paternal Aunt 37  . Breast cancer Maternal Aunt 85  . Kidney cancer Neg Hx   . Bladder Cancer Neg Hx   . Prostate cancer Neg Hx   . Ovarian cancer Neg Hx   . Diabetes Neg Hx     Social History:  reports that she has never  smoked. She has never used smokeless tobacco. She reports that she does not drink alcohol or use drugs.  ROS: UROLOGY Frequent Urination?: No Hard to postpone urination?: No Burning/pain with urination?: No Get up at night to urinate?: No Leakage of urine?: No Urine stream starts and stops?: No Trouble starting stream?: No Do you have to strain to urinate?: No Blood in urine?: No Urinary tract infection?: Yes Sexually transmitted disease?: No Injury to kidneys or bladder?: No Painful intercourse?: No Weak stream?: No Currently pregnant?: No Vaginal bleeding?: No Last menstrual period?: n  Gastrointestinal Nausea?: No Vomiting?: No Indigestion/heartburn?: No Diarrhea?: No Constipation?: No  Constitutional Fever: No Night sweats?: No Weight loss?: No Fatigue?: No  Skin Skin rash/lesions?: No Itching?: No  Eyes Blurred vision?: No Double vision?: No  Ears/Nose/Throat Sore throat?: No Sinus problems?: No  Hematologic/Lymphatic Swollen glands?: No Easy bruising?: No  Cardiovascular Leg swelling?: No Chest pain?: No  Respiratory Cough?: No Shortness of breath?: No  Endocrine Excessive thirst?: No  Musculoskeletal Back pain?: No Joint pain?: Yes  Neurological Headaches?: No Dizziness?: No  Psychologic Depression?: No Anxiety?: No  Physical Exam: BP 107/71   Pulse 88   Ht 5\' 2"  (1.575 m)   Wt 130 lb (59  kg)   LMP 07/01/2014 (Exact Date)   BMI 23.78 kg/m   Constitutional:  Well nourished. Alert and oriented, No acute distress. HEENT: [name] AT, moist mucus membranes.  Trachea midline, no masses. Cardiovascular: No clubbing, cyanosis, or edema. Respiratory: Normal respiratory effort, no increased work of breathing. Skin: No rashes, bruises or suspicious lesions. Lymph: No inguinal adenopathy. Neurologic: Grossly intact, no focal deficits, moving all 4 extremities. Psychiatric: Normal mood and affect.   Pertinent Imaging: Results for TRINKA, HASTEY (MRN [id number]) as of 06/20/2019 13:34  Ref. Range 06/20/2019 13:32  Scan Result Unknown 54mL    Assessment & Plan:    1. Recurrent UTI's Continue the Macrobid post coitally  Contact office if she should experience gross hematuria, high fevers/chills or flank pain with UTI RTC in one year   2. Vaginal atrophy Causes endometrial bleeding  3. Cystocele Not bothersome                                  Return in about 1 year (around 06/19/2020) for PVR and OAB questionnaire.  These notes generated with voice recognition software. I apologize for typographical errors.  Zara Council, PA-C  [facility] [address] [place], [place] [address] [phone]

## 2019-06-20 ENCOUNTER — Ambulatory Visit (INDEPENDENT_AMBULATORY_CARE_PROVIDER_SITE_OTHER): Payer: 59 | Admitting: Urology

## 2019-06-20 ENCOUNTER — Other Ambulatory Visit: Payer: Self-pay

## 2019-06-20 ENCOUNTER — Encounter: Payer: Self-pay | Admitting: Urology

## 2019-06-20 VITALS — BP 107/71 | HR 88 | Ht 62.0 in | Wt 130.0 lb

## 2019-06-20 DIAGNOSIS — N8111 Cystocele, midline: Secondary | ICD-10-CM

## 2019-06-20 DIAGNOSIS — N393 Stress incontinence (female) (male): Secondary | ICD-10-CM

## 2019-06-20 DIAGNOSIS — N952 Postmenopausal atrophic vaginitis: Secondary | ICD-10-CM | POA: Diagnosis not present

## 2019-06-20 DIAGNOSIS — Z87440 Personal history of urinary (tract) infections: Secondary | ICD-10-CM | POA: Diagnosis not present

## 2019-06-20 LAB — BLADDER SCAN AMB NON-IMAGING

## 2019-06-20 MED ORDER — NITROFURANTOIN MONOHYD MACRO 100 MG PO CAPS: 100.0000 mg | ORAL_CAPSULE | Freq: Two times a day (BID) | ORAL | 0 refills | Status: DC | PRN

## 2019-07-18 DIAGNOSIS — M159 Polyosteoarthritis, unspecified: Secondary | ICD-10-CM | POA: Diagnosis not present

## 2019-07-24 DIAGNOSIS — M1990 Unspecified osteoarthritis, unspecified site: Secondary | ICD-10-CM | POA: Insufficient documentation

## 2019-08-22 DIAGNOSIS — M79645 Pain in left finger(s): Secondary | ICD-10-CM | POA: Diagnosis not present

## 2019-08-22 DIAGNOSIS — I499 Cardiac arrhythmia, unspecified: Secondary | ICD-10-CM | POA: Insufficient documentation

## 2019-08-22 DIAGNOSIS — Z9109 Other allergy status, other than to drugs and biological substances: Secondary | ICD-10-CM | POA: Insufficient documentation

## 2019-08-22 DIAGNOSIS — I10 Essential (primary) hypertension: Secondary | ICD-10-CM | POA: Insufficient documentation

## 2019-08-22 DIAGNOSIS — R2230 Localized swelling, mass and lump, unspecified upper limb: Secondary | ICD-10-CM | POA: Diagnosis not present

## 2019-08-22 DIAGNOSIS — N399 Disorder of urinary system, unspecified: Secondary | ICD-10-CM | POA: Insufficient documentation

## 2019-09-17 DIAGNOSIS — Z4789 Encounter for other orthopedic aftercare: Secondary | ICD-10-CM | POA: Diagnosis not present

## 2019-09-17 DIAGNOSIS — M71342 Other bursal cyst, left hand: Secondary | ICD-10-CM | POA: Diagnosis not present

## 2019-10-05 DIAGNOSIS — Z8639 Personal history of other endocrine, nutritional and metabolic disease: Secondary | ICD-10-CM | POA: Diagnosis not present

## 2019-10-05 DIAGNOSIS — R7303 Prediabetes: Secondary | ICD-10-CM | POA: Diagnosis not present

## 2019-10-05 DIAGNOSIS — E7800 Pure hypercholesterolemia, unspecified: Secondary | ICD-10-CM | POA: Diagnosis not present

## 2019-10-10 DIAGNOSIS — R7303 Prediabetes: Secondary | ICD-10-CM | POA: Diagnosis not present

## 2019-10-10 DIAGNOSIS — Z8639 Personal history of other endocrine, nutritional and metabolic disease: Secondary | ICD-10-CM | POA: Diagnosis not present

## 2019-10-10 DIAGNOSIS — E7800 Pure hypercholesterolemia, unspecified: Secondary | ICD-10-CM | POA: Diagnosis not present

## 2019-10-24 DIAGNOSIS — I471 Supraventricular tachycardia: Secondary | ICD-10-CM | POA: Diagnosis not present

## 2019-11-19 DIAGNOSIS — Z461 Encounter for fitting and adjustment of hearing aid: Secondary | ICD-10-CM | POA: Diagnosis not present

## 2019-12-24 ENCOUNTER — Other Ambulatory Visit: Payer: Self-pay | Admitting: Cardiology

## 2019-12-28 IMAGING — MG MM DIGITAL SCREENING BILAT W/ TOMO W/ CAD
6 of 10 series · 6 of 30 positions shown · non-contrast
Comparison: Previous exam(s).

CLINICAL DATA: Screening.

EXAM:
DIGITAL SCREENING BILATERAL MAMMOGRAM WITH TOMO AND CAD

[L MLO synth-2D]
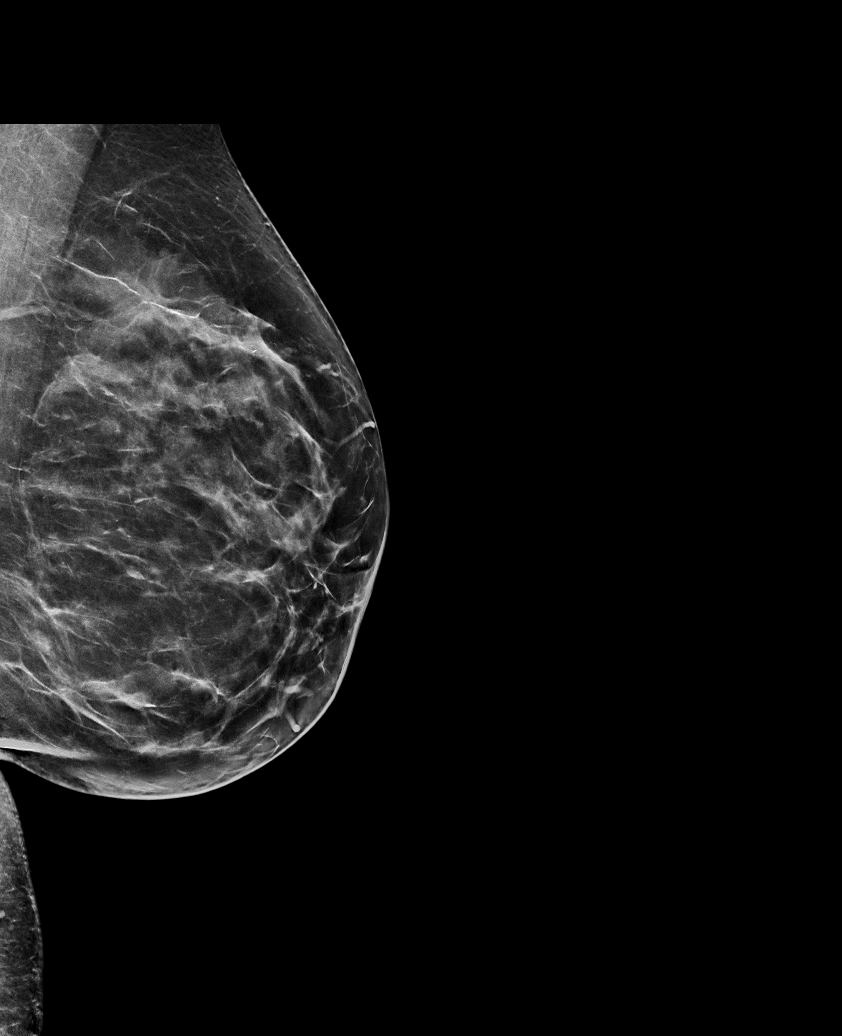

[L CC synth-2D]
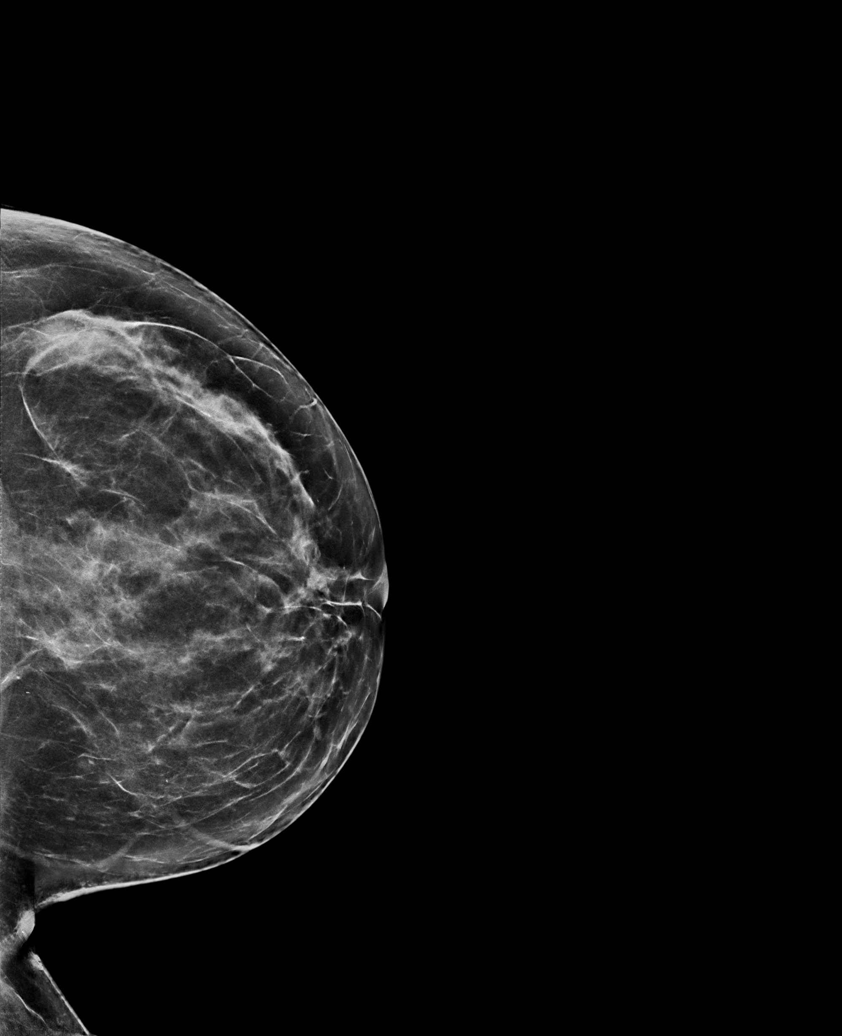

[R MLO synth-2D (1 of 2)]
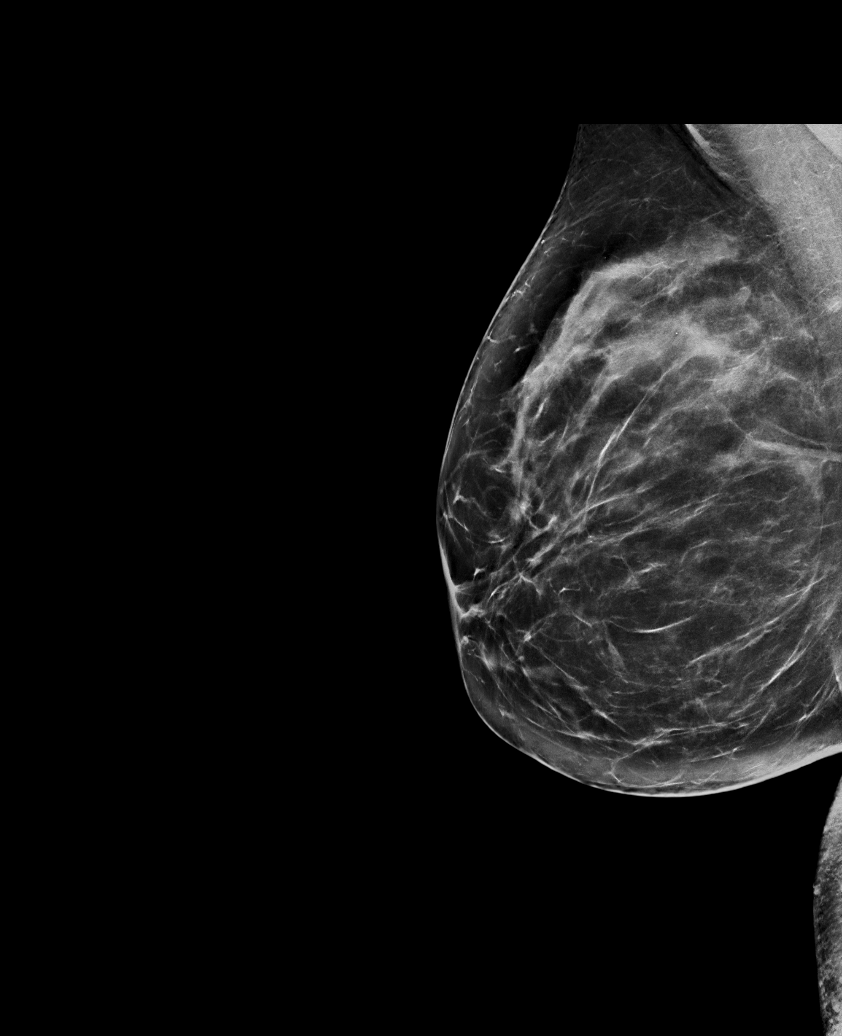

[R CC synth-2D]
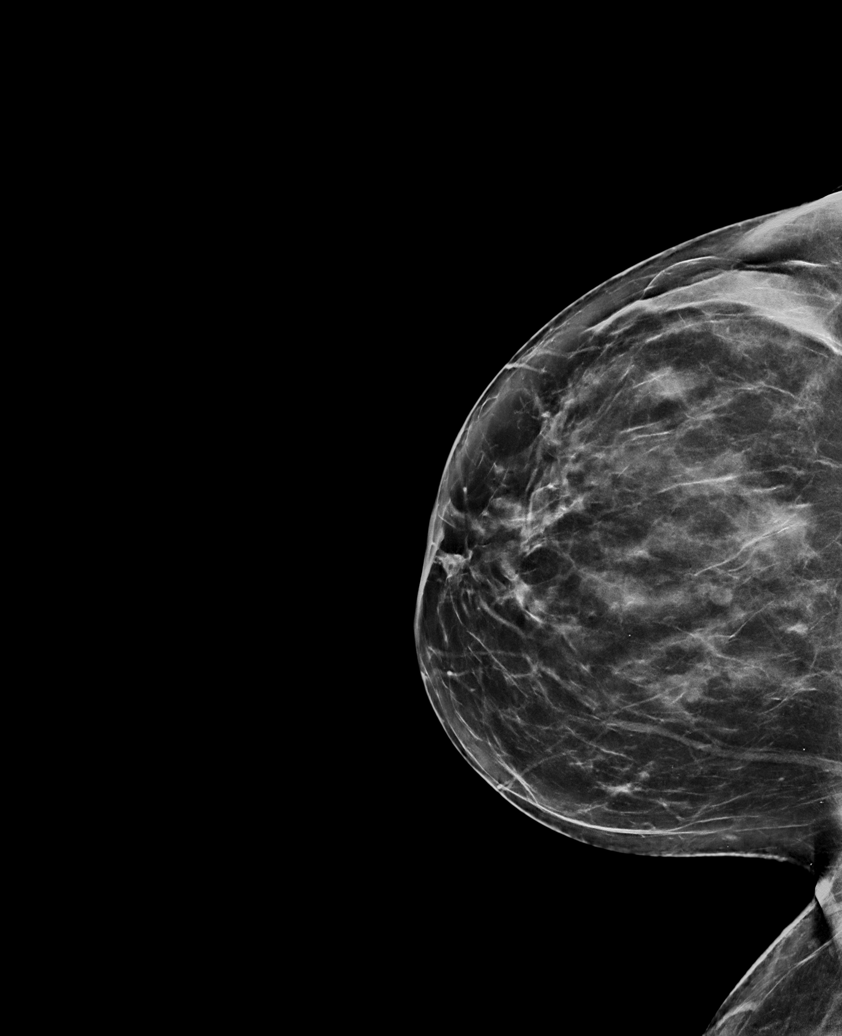

[R MLO synth-2D (2 of 2)]
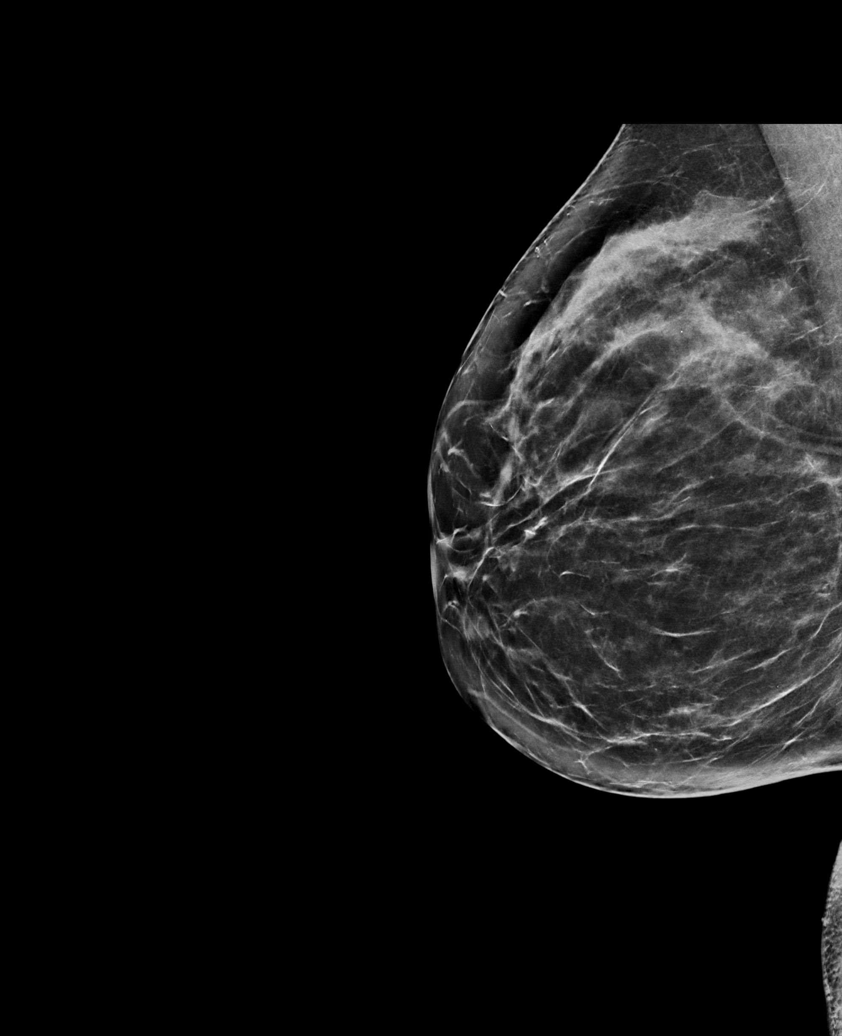

[L CC tomo · tomo slice 38/75.0]
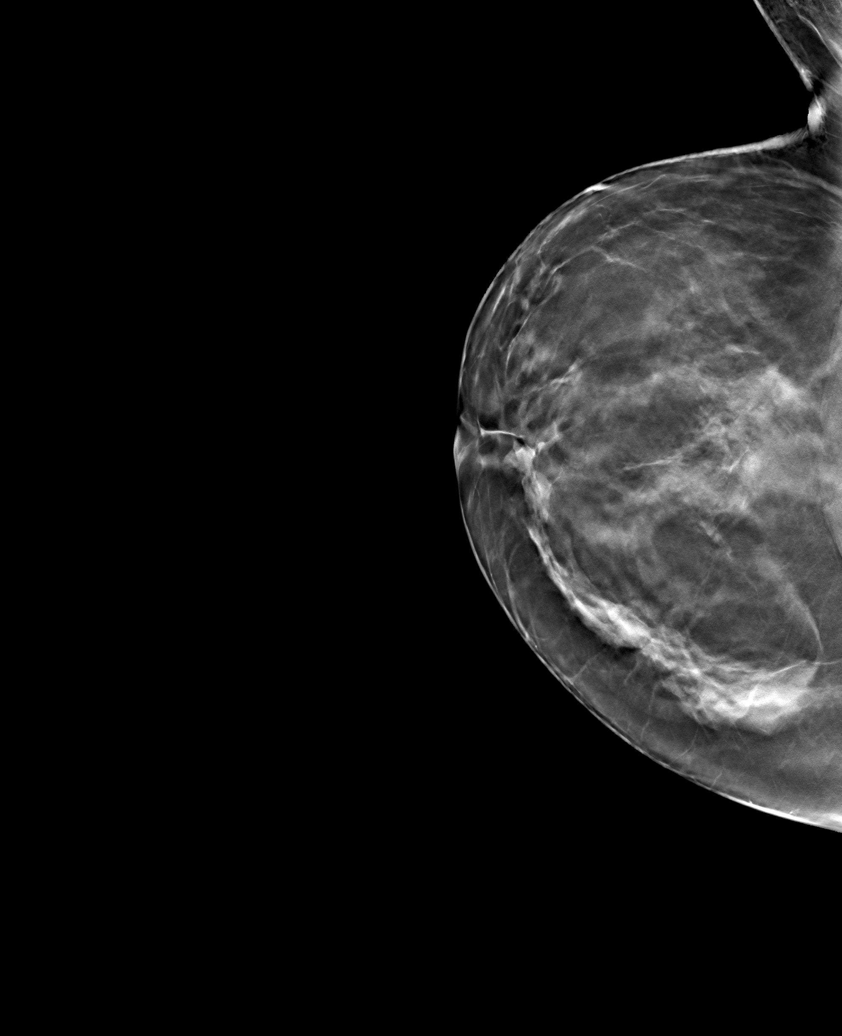

[6 of 30 positions shown; findings below may reference images not displayed]

ACR Breast Density Category c: The breast tissue is heterogeneously
dense, which may obscure small masses.
FINDINGS: There are no findings suspicious for malignancy. Images were
processed with CAD.
IMPRESSION: No mammographic evidence of malignancy. A result letter of this
screening mammogram will be mailed directly to the patient.

RECOMMENDATION:
Screening mammogram in one year. (Code:[id number]-U-LHB)

BI-RADS CATEGORY  1: Negative.

## 2020-02-06 DIAGNOSIS — H5213 Myopia, bilateral: Secondary | ICD-10-CM | POA: Diagnosis not present

## 2020-03-12 ENCOUNTER — Ambulatory Visit: Payer: Self-pay

## 2020-03-12 ENCOUNTER — Encounter: Payer: Self-pay | Admitting: Family Medicine

## 2020-03-12 ENCOUNTER — Ambulatory Visit (INDEPENDENT_AMBULATORY_CARE_PROVIDER_SITE_OTHER): Payer: 59 | Admitting: Family Medicine

## 2020-03-12 ENCOUNTER — Other Ambulatory Visit: Payer: Self-pay

## 2020-03-12 ENCOUNTER — Ambulatory Visit (INDEPENDENT_AMBULATORY_CARE_PROVIDER_SITE_OTHER): Payer: 59

## 2020-03-12 VITALS — BP 106/78 | HR 82 | Ht 62.0 in | Wt 131.8 lb

## 2020-03-12 DIAGNOSIS — M25561 Pain in right knee: Secondary | ICD-10-CM | POA: Diagnosis not present

## 2020-03-12 NOTE — Patient Instructions (Addendum)
Thank you for coming in today.  Plan for voltaren gel.  Use compression sleeve during exercise and for 30-60 mins following exercise.  Body Helix full knee sleeve.   Get xray today.   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 Cycling is great for knee.  Make sure you seat is far enough back.

## 2020-03-12 NOTE — Progress Notes (Signed)
    Subjective:    CC:R knee pain and R leg swelling  I, [name], LAT, ATC, am serving as scribe for Dr. Lynne Reynolds.  HPI: Pt is a 59 y/o female presenting w/ c/o R knee pain and R leg swelling.  She locates her knee pain to her R ant knee.  She denies any injury.  Pain is worse with running.  Radiating pain: yes into her R thigh Knee swelling: yes and into her R lower leg Knee mechanical symptoms: yes Aggravating factors: running; squatting;  Treatments tried: ice  Pertinent review of Systems: No fevers or chills  Relevant historical information: History of SVT and scoliosis.  History patellar tendon injury   Objective:    Vitals:   03/12/20 1254  BP: 106/78  Pulse: 82  SpO2: 96%   General: Well Developed, well nourished, and in no acute distress.   MSK: Right knee small effusion otherwise normal-appearing Nontender Normal motion with crepitation. Stable ligamentous exam. Intact strength.  Lab and Radiology Results  X-ray images right knee obtained today personally and independently reviewed Mild degenerative changes no acute fracture. Await formal radiology review  Diagnostic Limited MSK Ultrasound of: Right knee Tendon intact normal-appearing Small superior patellar joint effusion present. Patellar tendon normal. Medial joint line slightly narrowed. Lateral joint line slightly narrowed. Posterior knee reveals a small Baker's cyst. Impression: Mild DJD Baker's cyst and small knee effusion.    Impression and Recommendations:    Assessment and Plan: 59 y.o. female with right knee pain and swelling.  Patient has some degenerative changes on x-ray and ultrasound.  She has a small knee effusion and a small Baker's cyst.  I think at times these are probably worse.  Running seems to be a trigger.  Discussed activity modification.  Recommend cycling as this will also help with quad strength.  Recommend also compressive knee sleeve during and following  activity.  Additionally recommend diclofenac gel.  Recheck back as needed.  Steroid injection will be reasonable if needed into the future..   Orders Placed This Encounter  Procedures  . [place] LIMITED JOINT SPACE STRUCTURES LOW RIGHT(NO LINKED CHARGES)    Order Specific Question:   Reason for Exam (SYMPTOM  OR DIAGNOSIS REQUIRED)    Answer:   R knee pain    Order Specific Question:   Preferred imaging location?    Answer:   [facility]  . DG Knee AP/LAT W/Sun[place] Right    Standing Status:   Future    Number of Occurrences:   1    Standing Expiration Date:   03/12/2021    Order Specific Question:   Reason for Exam (SYMPTOM  OR DIAGNOSIS REQUIRED)    Answer:   eval knee pain    Order Specific Question:   Is patient pregnant?    Answer:   No    Order Specific Question:   Preferred imaging location?    Answer:   [facility]    Order Specific Question:   Radiology Contrast Protocol - do NOT remove file path    Answer:   [email or web address]   No orders of the defined types were placed in this encounter.   Discussed warning signs or symptoms. Please see discharge instructions. Patient expresses understanding.   The above documentation has been reviewed and is accurate and complete [name], M.D.

## 2020-03-14 NOTE — Progress Notes (Signed)
X-ray looks pretty normal to radiology.  No significant arthritis

## 2020-04-16 DIAGNOSIS — D225 Melanocytic nevi of trunk: Secondary | ICD-10-CM | POA: Diagnosis not present

## 2020-04-16 DIAGNOSIS — D2262 Melanocytic nevi of left upper limb, including shoulder: Secondary | ICD-10-CM | POA: Diagnosis not present

## 2020-04-16 DIAGNOSIS — L821 Other seborrheic keratosis: Secondary | ICD-10-CM | POA: Diagnosis not present

## 2020-04-16 DIAGNOSIS — D485 Neoplasm of uncertain behavior of skin: Secondary | ICD-10-CM | POA: Diagnosis not present

## 2020-04-16 DIAGNOSIS — D2261 Melanocytic nevi of right upper limb, including shoulder: Secondary | ICD-10-CM | POA: Diagnosis not present

## 2020-04-16 DIAGNOSIS — Z85828 Personal history of other malignant neoplasm of skin: Secondary | ICD-10-CM | POA: Diagnosis not present

## 2020-04-16 DIAGNOSIS — D2272 Melanocytic nevi of left lower limb, including hip: Secondary | ICD-10-CM | POA: Diagnosis not present

## 2020-04-16 DIAGNOSIS — C44319 Basal cell carcinoma of skin of other parts of face: Secondary | ICD-10-CM | POA: Diagnosis not present

## 2020-04-18 DIAGNOSIS — E7800 Pure hypercholesterolemia, unspecified: Secondary | ICD-10-CM | POA: Diagnosis not present

## 2020-04-18 DIAGNOSIS — R7303 Prediabetes: Secondary | ICD-10-CM | POA: Diagnosis not present

## 2020-04-23 ENCOUNTER — Other Ambulatory Visit: Payer: Self-pay | Admitting: Family Medicine

## 2020-04-23 DIAGNOSIS — Z0000 Encounter for general adult medical examination without abnormal findings: Secondary | ICD-10-CM | POA: Diagnosis not present

## 2020-04-23 DIAGNOSIS — Z8639 Personal history of other endocrine, nutritional and metabolic disease: Secondary | ICD-10-CM | POA: Diagnosis not present

## 2020-04-23 DIAGNOSIS — E7800 Pure hypercholesterolemia, unspecified: Secondary | ICD-10-CM | POA: Diagnosis not present

## 2020-04-23 DIAGNOSIS — R7303 Prediabetes: Secondary | ICD-10-CM | POA: Diagnosis not present

## 2020-05-07 ENCOUNTER — Ambulatory Visit (INDEPENDENT_AMBULATORY_CARE_PROVIDER_SITE_OTHER): Payer: 59 | Admitting: Obstetrics and Gynecology

## 2020-05-07 ENCOUNTER — Other Ambulatory Visit: Payer: Self-pay

## 2020-05-07 ENCOUNTER — Other Ambulatory Visit (HOSPITAL_COMMUNITY)
Admission: RE | Admit: 2020-05-07 | Discharge: 2020-05-07 | Disposition: A | Discharge status: Home / Self Care | Payer: 59 | Source: Ambulatory Visit | Attending: Obstetrics and Gynecology | Admitting: Obstetrics and Gynecology

## 2020-05-07 ENCOUNTER — Encounter: Payer: Self-pay | Admitting: Obstetrics and Gynecology

## 2020-05-07 VITALS — BP 112/74 | HR 109 | Ht 62.0 in | Wt 132.0 lb

## 2020-05-07 DIAGNOSIS — N951 Menopausal and female climacteric states: Secondary | ICD-10-CM | POA: Diagnosis not present

## 2020-05-07 DIAGNOSIS — E039 Hypothyroidism, unspecified: Secondary | ICD-10-CM | POA: Diagnosis not present

## 2020-05-07 DIAGNOSIS — Z01419 Encounter for gynecological examination (general) (routine) without abnormal findings: Secondary | ICD-10-CM | POA: Diagnosis not present

## 2020-05-07 DIAGNOSIS — Z124 Encounter for screening for malignant neoplasm of cervix: Secondary | ICD-10-CM | POA: Insufficient documentation

## 2020-05-07 DIAGNOSIS — R7303 Prediabetes: Secondary | ICD-10-CM

## 2020-05-07 DIAGNOSIS — Z809 Family history of malignant neoplasm, unspecified: Secondary | ICD-10-CM | POA: Diagnosis not present

## 2020-05-07 DIAGNOSIS — Z1231 Encounter for screening mammogram for malignant neoplasm of breast: Secondary | ICD-10-CM | POA: Diagnosis not present

## 2020-05-07 NOTE — Patient Instructions (Signed)
Preventive Care 40-58 Years Old, Female Preventive care refers to visits with your health care provider and lifestyle choices that can promote health and wellness. This includes:  A yearly physical exam. This may also be called an annual well check.  Regular dental visits and eye exams.  Immunizations.  Screening for certain conditions.  Healthy lifestyle choices, such as eating a healthy diet, getting regular exercise, not using drugs or products that contain nicotine and tobacco, and limiting alcohol use. What can I expect for my preventive care visit? Physical exam Your health care provider will check your:  Height and weight. This may be used to calculate body mass index (BMI), which tells if you are at a healthy weight.  Heart rate and blood pressure.  Skin for abnormal spots. Counseling Your health care provider may ask you questions about your:  Alcohol, tobacco, and drug use.  Emotional well-being.  Home and relationship well-being.  Sexual activity.  Eating habits.  Work and work environment.  Method of birth control.  Menstrual cycle.  Pregnancy history. What immunizations do I need?  Influenza (flu) vaccine  This is recommended every year. Tetanus, diphtheria, and pertussis (Tdap) vaccine  You may need a Td booster every 10 years. Varicella (chickenpox) vaccine  You may need this if you have not been vaccinated. Zoster (shingles) vaccine  You may need this after age 60. Measles, mumps, and rubella (MMR) vaccine  You may need at least one dose of MMR if you were born in 1957 or later. You may also need a second dose. Pneumococcal conjugate (PCV13) vaccine  You may need this if you have certain conditions and were not previously vaccinated. Pneumococcal polysaccharide (PPSV23) vaccine  You may need one or two doses if you smoke cigarettes or if you have certain conditions. Meningococcal conjugate (MenACWY) vaccine  You may need this if you  have certain conditions. Hepatitis A vaccine  You may need this if you have certain conditions or if you travel or work in places where you may be exposed to hepatitis A. Hepatitis B vaccine  You may need this if you have certain conditions or if you travel or work in places where you may be exposed to hepatitis B. Haemophilus influenzae type b (Hib) vaccine  You may need this if you have certain conditions. Human papillomavirus (HPV) vaccine  If recommended by your health care provider, you may need three doses over 6 months. You may receive vaccines as individual doses or as more than one vaccine together in one shot (combination vaccines). Talk with your health care provider about the risks and benefits of combination vaccines. What tests do I need? Blood tests  Lipid and cholesterol levels. These may be checked every 5 years, or more frequently if you are over 58 years old.  Hepatitis C test.  Hepatitis B test. Screening  Lung cancer screening. You may have this screening every year starting at age 58 if you have a 30-pack-year history of smoking and currently smoke or have quit within the past 15 years.  Colorectal cancer screening. All adults should have this screening starting at age 58 and continuing until age 58. Your health care provider may recommend screening at age 45 if you are at increased risk. You will have tests every 1-10 years, depending on your results and the type of screening test.  Diabetes screening. This is done by checking your blood sugar (glucose) after you have not eaten for a while (fasting). You may have this   done every 1-3 years.  Mammogram. This may be done every 1-2 years. Talk with your health care provider about when you should start having regular mammograms. This may depend on whether you have a family history of breast cancer.  BRCA-related cancer screening. This may be done if you have a family history of breast, ovarian, tubal, or peritoneal  cancers.  Pelvic exam and Pap test. This may be done every 3 years starting at age 58. Starting at age 7, this may be done every 5 years if you have a Pap test in combination with an HPV test. Other tests  Sexually transmitted disease (STD) testing.  Bone density scan. This is done to screen for osteoporosis. You may have this scan if you are at high risk for osteoporosis. Follow these instructions at home: Eating and drinking  Eat a diet that includes fresh fruits and vegetables, whole grains, lean protein, and low-fat dairy.  Take vitamin and mineral supplements as recommended by your health care provider.  Do not drink alcohol if: ? Your health care provider tells you not to drink. ? You are pregnant, may be pregnant, or are planning to become pregnant.  If you drink alcohol: ? Limit how much you have to 0-1 drink a day. ? Be aware of how much alcohol is in your drink. In the [place] one drink equals one 12 oz bottle of beer (355 mL), one 5 oz glass of wine (148 mL), or one 1 oz glass of hard liquor (44 mL). Lifestyle  Take daily care of your teeth and gums.  Stay active. Exercise for at least 30 minutes on 5 or more days each week.  Do not use any products that contain nicotine or tobacco, such as cigarettes, e-cigarettes, and chewing tobacco. If you need help quitting, ask your health care provider.  If you are sexually active, practice safe sex. Use a condom or other form of birth control (contraception) in order to prevent pregnancy and STIs (sexually transmitted infections).  If told by your health care provider, take low-dose aspirin daily starting at age 58. What's next?  Visit your health care provider once a year for a well check visit.  Ask your health care provider how often you should have your eyes and teeth checked.  Stay up to date on all vaccines. This information is not intended to replace advice given to you by your health care provider. Make sure you  discuss any questions you have with your health care provider. Document Revised: 04/20/2018 Document Reviewed: 04/20/2018 Elsevier Patient Education  2020 [place] Breast self-awareness is knowing how your breasts look and feel. Doing breast self-awareness is important. It allows you to catch a breast problem early while it is still small and can be treated. All women should do breast self-awareness, including women who have had breast implants. Tell your doctor if you notice a change in your breasts. What you need:  A mirror.  A well-lit room. How to do a breast self-exam A breast self-exam is one way to learn what is normal for your breasts and to check for changes. To do a breast self-exam: Look for changes  1. Take off all the clothes above your waist. 2. Stand in front of a mirror in a room with good lighting. 3. Put your hands on your hips. 4. Push your hands down. 5. Look at your breasts and nipples in the mirror to see if one breast or nipple looks different from the  other. Check to see if: ? The shape of one breast is different. ? The size of one breast is different. ? There are wrinkles, dips, and bumps in one breast and not the other. 6. Look at each breast for changes in the skin, such as: ? Redness. ? Scaly areas. 7. Look for changes in your nipples, such as: ? Liquid around the nipples. ? Bleeding. ? Dimpling. ? Redness. ? A change in where the nipples are. Feel for changes  1. Lie on your back on the floor. 2. Feel each breast. To do this, follow these steps: ? Pick a breast to feel. ? Put the arm closest to that breast above your head. ? Use your other arm to feel the nipple area of your breast. Feel the area with the pads of your three middle fingers by making small circles with your fingers. For the first circle, press lightly. For the second circle, press harder. For the third circle, press even harder. ? Keep making circles with  your fingers at the different pressures as you move down your breast. Stop when you feel your ribs. ? Move your fingers a little toward the center of your body. ? Start making circles with your fingers again, this time going up until you reach your collarbone. ? Keep making up-and-down circles until you reach your armpit. Remember to keep using the three pressures. ? Feel the other breast in the same way. 3. Sit or stand in the tub or shower. 4. With soapy water on your skin, feel each breast the same way you did in step 2 when you were lying on the floor. Write down what you find Writing down what you find can help you remember what to tell your doctor. Write down:  What is normal for each breast.  Any changes you find in each breast, including: ? The kind of changes you find. ? Whether you have pain. ? Size and location of any lumps.  When you last had your menstrual period. General tips  Check your breasts every month.  If you are breastfeeding, the best time to check your breasts is after you feed your baby or after you use a breast pump.  If you get menstrual periods, the best time to check your breasts is 5-7 days after your menstrual period is over.  With time, you will become comfortable with the self-exam, and you will begin to know if there are changes in your breasts. Contact a doctor if you:  See a change in the shape or size of your breasts or nipples.  See a change in the skin of your breast or nipples, such as red or scaly skin.  Have fluid coming from your nipples that is not normal.  Find a lump or thick area that was not there before.  Have pain in your breasts.  Have any concerns about your breast health. Summary  Breast self-awareness includes looking for changes in your breasts, as well as feeling for changes within your breasts.  Breast self-awareness should be done in front of a mirror in a well-lit room.  You should check your breasts every month.  If you get menstrual periods, the best time to check your breasts is 5-7 days after your menstrual period is over.  Let your doctor know of any changes you see in your breasts, including changes in size, changes on the skin, pain or tenderness, or fluid from your nipples that is not normal. This information is not  intended to replace advice given to you by your health care provider. Make sure you discuss any questions you have with your health care provider. Document Revised: 03/28/2018 Document Reviewed: 03/28/2018 Elsevier Patient Education  [place].

## 2020-05-07 NOTE — Progress Notes (Signed)
Pt present for annual exam. Pt stated that she was doing well no problems.  

## 2020-05-07 NOTE — Progress Notes (Addendum)
ANNUAL PREVENTATIVE CARE GYNECOLOGY  ENCOUNTER NOTE  Subjective:       [name] is a 59 y.o. G3P3003 female here for a routine annual gynecologic exam. The patient is sexually active. The patient has never taken hormone replacement therapy. Patient denies post-menopausal vaginal bleeding. The patient wears seatbelts: no. The patient participates in regular exercise: yes (5 x weekly for 45 minutes, runs/jogs). Has the patient ever been transfused or tattooed?: no.   Current complaints: 1.  Still noting hot flushes, worse over the past 2 months.  Did note that her thyroid levels were recently checked and were abnormal.  Has just had medications readjusted.     Gynecologic History Patient's last menstrual period was 07/01/2014 (exact date). Contraception: post menopausal status Last Pap: 03/2017. Results were: normal Last mammogram: 06/13/2019. Results were: normal.  Last Colonoscopy: 06/2018.  Results were normal. Repeat q 5 years due to polyps and family history of colon cancer (father).    Obstetric History OB History  Gravida Para Term Preterm AB Living  3 3 3     3   SAB TAB Ectopic Multiple Live Births          3    # Outcome Date GA Lbr Len/2nd Weight Sex Delivery Anes PTL Lv  3 Term 08/08/96    F CS-Unspec   LIV  2 Term 08/23/95    M CS-Unspec   LIV  1 Term 10/25/93    M CS-Unspec   LIV    Past Medical History:  Diagnosis Date  . Allergy   . Arthritis    osteoarthritis - fingers  . Asthma   . Chicken pox   . Chronic UTI   . Cystocele    2nd degree  . Endometriosis   . Family history of polyps in the colon   . Graves disease   . History of heavy periods   . Hyperlipemia   . IBS (irritable bowel syndrome)   . Perimenopausal   . PONV (postoperative nausea and vomiting)    nausea only  . Rectocele    mild  . S/P endometrial ablation   . Scoliosis   . Skin cancer   . SUI (stress urinary incontinence, female)   . SVT (supraventricular tachycardia)  ([place])   . Urine incontinence   . Wears contact lenses     Family History  Problem Relation Age of Onset  . Cancer Mother   . Hyperlipidemia Mother   . Heart disease Mother   . Hypertension Mother   . Breast cancer Mother 31  . Kidney failure Mother   . Stroke Mother   . Hyperlipidemia Father   . Heart disease Father   . Hypertension Father   . Cancer Father        colon  . Breast cancer Paternal Aunt 16  . Breast cancer Maternal Aunt 85  . Kidney cancer Neg Hx   . Bladder Cancer Neg Hx   . Prostate cancer Neg Hx   . Ovarian cancer Neg Hx   . Diabetes Neg Hx     Past Surgical History:  Procedure Laterality Date  . COLONOSCOPY WITH PROPOFOL N/A 07/10/2018   Procedure: COLONOSCOPY WITH PROPOFOL;  Surgeon: Manya Silvas, MD;  Location: [facility] ENDOSCOPY;  Service: Endoscopy;  Laterality: N/A;  . endometrrial ablation    . EYE SURGERY    . knee athroscopy Left 2013  . laprascopic surgery     endometreosis  . NASOPHARYNGOSCOPY EUSTATION TUBE BALLOON DILATION  Bilateral 05/18/2018   Procedure: NASOPHARYNGOSCOPY EUSTATION TUBE BALLOON DILATION OUTFRACTURE BILATERAL INFERIOR TURBINATES;  Surgeon: Margaretha Sheffield, MD;  Location: [place];  Service: ENT;  Laterality: Bilateral;  . NASOPHARYNGOSCOPY EUSTATION TUBE BALLOON DILATION    . nova-sure    . THYROIDECTOMY, PARTIAL    . TONSILLECTOMY AND ADENOIDECTOMY    . tubes in ears      Social History   Socioeconomic History  . Marital status: Married    Spouse name: Not on file  . Number of children: Not on file  . Years of education: Not on file  . Highest education level: Not on file  Occupational History  . Occupation: Lobbyist  Tobacco Use  . Smoking status: Never Smoker  . Smokeless tobacco: Never Used  Vaping Use  . Vaping Use: Never used  Substance and Sexual Activity  . Alcohol use: No    Alcohol/week: 0.0 standard drinks  . Drug use: No  . Sexual activity: Yes    Birth control/protection:  Post-menopausal  Other Topics Concern  . Not on file  Social History Narrative  . Not on file   Social Determinants of Health   Financial Resource Strain:   . Difficulty of Paying Living Expenses: Not on file  Food Insecurity:   . Worried About Charity fundraiser in the Last Year: Not on file  . Ran Out of Food in the Last Year: Not on file  Transportation Needs:   . Lack of Transportation (Medical): Not on file  . Lack of Transportation (Non-Medical): Not on file  Physical Activity:   . Days of Exercise per Week: Not on file  . Minutes of Exercise per Session: Not on file  Stress:   . Feeling of Stress : Not on file  Social Connections:   . Frequency of Communication with Friends and Family: Not on file  . Frequency of Social Gatherings with Friends and Family: Not on file  . Attends Religious Services: Not on file  . Active Member of Clubs or Organizations: Not on file  . Attends Archivist Meetings: Not on file  . Marital Status: Not on file  Intimate Partner Violence:   . Fear of Current or Ex-Partner: Not on file  . Emotionally Abused: Not on file  . Physically Abused: Not on file  . Sexually Abused: Not on file    Current Outpatient Medications on File Prior to Visit  Medication Sig Dispense Refill  . albuterol (PROVENTIL HFA;VENTOLIN HFA) 108 (90 BASE) MCG/ACT inhaler Inhale 2 puffs into the lungs every 6 (six) hours as needed for wheezing or shortness of breath.    Marland Kitchen aspirin 81 MG tablet Take 81 mg by mouth daily.    . cholecalciferol (VITAMIN D) 1000 UNITS tablet Take 1,000 Units by mouth daily.    Marland Kitchen CRANBERRY PO Take by mouth.    . EPIPEN 2-PAK 0.3 MG/0.3ML SOAJ injection USE FOR ANAPHYLAXIS 2 Device 3  . fexofenadine (ALLEGRA ALLERGY) 180 MG tablet Allegra Allergy 180 mg tablet  Take 1 tablet every day by oral route.    . fluticasone (FLONASE) 50 MCG/ACT nasal spray Place 1 spray into both nostrils daily as needed.     Marland Kitchen imipramine (TOFRANIL) 25 MG  tablet Take 50 mg by mouth at bedtime.    Marland Kitchen levothyroxine (SYNTHROID) 88 MCG tablet Take 88 mcg by mouth daily before breakfast. Take one tablet in the morning M-F    . lovastatin (MEVACOR) 20 MG tablet Take  20 mg by mouth at bedtime.    . metoprolol succinate (TOPROL-XL) 50 MG 24 hr tablet Take 50 mg by mouth daily. Take with or immediately following a meal.    . montelukast (SINGULAIR) 10 MG tablet Take 10 mg by mouth at bedtime.    . Multiple Vitamin (MULTI-VITAMINS) TABS Take by mouth.    . nitrofurantoin, macrocrystal-monohydrate, (MACROBID) 100 MG capsule Take 1 capsule (100 mg total) by mouth 2 (two) times daily as needed. Take one after intercourse 90 capsule 0   No current facility-administered medications on file prior to visit.    Allergies  Allergen Reactions  . Amoxicillin   . Bextra [Valdecoxib]   . Ciprofloxacin   . Floxin [Ofloxacin]   . Gyne-Lotrimin [Clotrimazole]   . Levofloxacin   . Quinolones Other (See Comments)  . Septra [Sulfamethoxazole-Trimethoprim] Other (See Comments)    Flushed and fever.  . Sulfa Antibiotics Other (See Comments)    fever  . Penicillins Rash     Review of Systems ROS Review of Systems - General ROS: negative for - chills, fatigue, fever, hot flashes, night sweats, weight gain or weight loss Psychological ROS: negative for - anxiety, decreased libido, depression, mood swings, physical abuse or sexual abuse Ophthalmic ROS: negative for - blurry vision, eye pain or loss of vision ENT ROS: negative for - headaches, hearing change, visual changes or vocal changes Allergy and Immunology ROS: negative for - hives, itchy/watery eyes or seasonal allergies Hematological and Lymphatic ROS: negative for - bleeding problems, bruising, swollen lymph nodes or weight loss Endocrine ROS: negative for - galactorrhea, hair pattern changes, hot flashes, malaise/lethargy, mood swings, palpitations, polydipsia/polyuria, skin changes, temperature intolerance  or unexpected weight changes Breast ROS: negative for - new or changing breast lumps  Is still or nipple discharge Respiratory ROS: negative for - cough or shortness of breath Cardiovascular ROS: negative for - chest pain, irregular heartbeat, palpitations or shortness of breath Gastrointestinal ROS: no abdominal pain, change in bowel habits, or black or bloody stools Genito-Urinary ROS: no dysuria, trouble voiding, or hematuria Musculoskeletal ROS: negative for - joint pain or joint stiffness Neurological ROS: negative for - bowel and bladder control changes Dermatological ROS: negative for rash and skin lesion changes   Objective:   BP 112/74   Pulse (!) 109   Ht 5\' 2"  (1.575 m)   Wt 132 lb (59.9 kg)   LMP 07/01/2014 (Exact Date)   BMI 24.14 kg/m  CONSTITUTIONAL: Well-developed, well-nourished female in no acute distress.  PSYCHIATRIC: Normal mood and affect. Normal behavior. Normal judgment and thought content. Early: Alert and oriented to person, place, and time. Normal muscle tone coordination. No cranial nerve deficit noted. HENT:  Normocephalic, atraumatic, External right and left ear normal. Oropharynx is clear and moist EYES: Conjunctivae and EOM are normal. Pupils are equal, round, and reactive to light. No scleral icterus.  NECK: Normal range of motion, supple, no masses.  Normal thyroid.  SKIN: Skin is warm and dry. No rash noted. Not diaphoretic. No erythema. No pallor. CARDIOVASCULAR: Normal heart rate noted, regular rhythm, no murmur. RESPIRATORY: Clear to auscultation bilaterally. Effort and breath sounds normal, no problems with respiration noted. BREASTS: Symmetric in size. No masses, skin changes, nipple drainage, or lymphadenopathy.    ABDOMEN: Soft, normal bowel sounds, no distention noted.  No tenderness, rebound or guarding.  BLADDER: Normal PELVIC:  Bladder no bladder distension noted  Urethra: normal appearing urethra with no masses, tenderness or  lesions  Vulva: normal appearing  vulva with no masses, tenderness or lesions  Vagina: normal appearing vagina with normal color and discharge, no lesions  Cervix: normal appearing cervix without discharge or lesions  Uterus: uterus is normal size, shape, consistency and nontender  Adnexa: normal adnexa in size, nontender and no masses  RV: External Exam NormaI, No Rectal Masses and Normal Sphincter tone  MUSCULOSKELETAL: Normal range of motion. No tenderness.  No cyanosis, clubbing, or edema.  2+ distal pulses. LYMPHATIC: No Axillary, Supraclavicular, or Inguinal Adenopathy.   Labs: Labs done by PCP at [facility] in August, reviewed in Care Everywhere  Assessment:   Encounter for well woman exam with routine gynecological exam Vasomotor symptoms due to menopause Hypothyroidism Family history of cancer (breast and colon) History of colon polyps Prediabetes  Plan:  - Pap: Pap Co Test performed today.   - Mammogram: Ordered.  - Stool Guaiac Testing:  Not Ordered.  Up to date with colonoscopy. For repeat in 2024. - Labs: Reviewed in Care Everywhere.    - Routine preventative health maintenance measures emphasized: Exercise/Diet/Weight control, Tobacco Warnings, Alcohol/Substance use risks, Stress Management, Peer Pressure Issues and Safe Sex  - Family history of cancer (breast and colon). Continue routine screening. Family members with cancers in advanced age. Can consider hereditary screening if desired, or if additional family members are affected.  Pre-diabetes and hypothyroidism to be managed by PCP.  - Menopausal vasomotor symptoms overall manageable. Continue lifestyle interventions. - Plans to get flu vaccine next month at work.  - COVID vaccination series completed.  - Return to [place] [facility] Care

## 2020-05-12 ENCOUNTER — Encounter: Payer: Self-pay | Admitting: Obstetrics and Gynecology

## 2020-05-13 LAB — CYTOLOGY - PAP
Comment: NEGATIVE
Diagnosis: NEGATIVE
High risk HPV: NEGATIVE

## 2020-06-11 DIAGNOSIS — C44319 Basal cell carcinoma of skin of other parts of face: Secondary | ICD-10-CM | POA: Diagnosis not present

## 2020-06-18 ENCOUNTER — Other Ambulatory Visit: Payer: Self-pay

## 2020-06-18 ENCOUNTER — Ambulatory Visit
Admission: RE | Admit: 2020-06-18 | Discharge: 2020-06-18 | Disposition: A | Discharge status: Home / Self Care | Payer: 59 | Source: Ambulatory Visit | Attending: Obstetrics and Gynecology | Admitting: Obstetrics and Gynecology

## 2020-06-18 DIAGNOSIS — Z1231 Encounter for screening mammogram for malignant neoplasm of breast: Secondary | ICD-10-CM | POA: Insufficient documentation

## 2020-06-24 NOTE — Progress Notes (Signed)
06/25/2020 3:34 PM   H[place] 1960/12/08 [id number]  Referring provider: Dion Body, MD [facility] [place],  [place] [address]  Chief Complaint  Patient presents with  . Over Active Bladder    HPI: 59 yo female with recurrent UTI's, vaginal atrophy, SUI and cystocele for a one year follow up.    rUTI's Risk factors: age, post menopausal state, consumption of surgery drinks, constipation, incontinence and intercourse.  RUS performed on 02/28/2017 was normal.   She has had two UTI's over the last year.  Malodorous odor, increase in urgency and lower back pain.  One in February and one in August.  Both responded to the [place].   Patient denies any modifying or aggravating factors.  Patient denies any gross hematuria, dysuria or suprapubic/flank pain.  Patient denies any fevers, chills, nausea or vomiting.   Vaginal atrophy Risk factor for rUTI.  Pelvic ultrasound negative in 05/2018.  Endometrial biopsy negative in 05/2018.  Mammogram negative in 05/2019.  Vaginal estrogen cream continues to cause bleeding, so she has discontinued.    SUI Managed with imipramine prescribed by her PCP.  The patient has been experiencing urgency x 0-3 (stable), frequency x 4-7 (stable), not restricting fluids to avoid visits to the restroom, not engaging in toilet mapping, incontinence x 0-3 (stable) and nocturia x 0-3 (stable).  Patient denies any gross hematuria, dysuria or suprapubic/flank pain.  Patient denies any fevers, chills, nausea or vomiting.   Her PVR is 70 mL    Cystocele No bothersome stress incontinence  PMH: Past Medical History:  Diagnosis Date  . Allergy   . Arthritis    osteoarthritis - fingers  . Asthma   . Chicken pox   . Chronic UTI   . Cystocele    2nd degree  . Endometriosis   . Family history of polyps in the colon   . Graves disease   . History of heavy periods   . Hyperlipemia   . IBS (irritable bowel  syndrome)   . Perimenopausal   . PONV (postoperative nausea and vomiting)    nausea only  . Rectocele    mild  . S/P endometrial ablation   . Scoliosis   . Skin cancer   . SUI (stress urinary incontinence, female)   . SVT (supraventricular tachycardia) ([place])   . Urine incontinence   . Wears contact lenses     Surgical History: Past Surgical History:  Procedure Laterality Date  . COLONOSCOPY WITH PROPOFOL N/A 07/10/2018   Procedure: COLONOSCOPY WITH PROPOFOL;  Surgeon: Manya Silvas, MD;  Location[facility] ENDOSCOPY;  Service: Endoscopy;  Laterality: N/A;  . endometrrial ablation    . EYE SURGERY    . knee athroscopy Left 2013  . laprascopic surgery     endometreosis  . NASOPHARYNGOSCOPY EUSTATION TUBE BALLOON DILATION Bilateral 05/18/2018   Procedure: NASOPHARYNGOSCOPY EUSTATION TUBE BALLOON DILATION OUTFRACTURE BILATERAL INFERIOR TURBINATES;  Surgeon: Margaretha Sheffield, MD;  Location: [facility];  Service: ENT;  Laterality: Bilateral;  . NASOPHARYNGOSCOPY EUSTATION TUBE BALLOON DILATION    . nova-sure    . THYROIDECTOMY, PARTIAL    . TONSILLECTOMY AND ADENOIDECTOMY    . tubes in ears      Home Medications:  Allergies as of 06/25/2020      Reactions   Amoxicillin    Bextra [valdecoxib]    Ciprofloxacin    Floxin [ofloxacin]    Gyne-lotrimin [clotrimazole]    Levofloxacin    Quinolones Other (  See Comments)   Septra [sulfamethoxazole-trimethoprim] Other (See Comments)   Flushed and fever.   Sulfa Antibiotics Other (See Comments)   fever   Penicillins Rash      Medication List       Accurate as of June 25, 2020 11:59 PM. If you have any questions, ask your nurse or doctor.        albuterol 108 (90 Base) MCG/ACT inhaler Commonly known as: VENTOLIN HFA Inhale 2 puffs into the lungs every 6 (six) hours as needed for wheezing or shortness of breath.   Allegra Allergy 180 MG tablet Generic drug: fexofenadine Allegra Allergy 180 mg tablet  Take 1 tablet  every day by oral route.   aspirin 81 MG tablet Take 81 mg by mouth daily.   cholecalciferol 1000 units tablet Commonly known as: VITAMIN D Take 1,000 Units by mouth daily.   CRANBERRY PO Take by mouth.   EpiPen 2-Pak 0.3 mg/0.3 mL Soaj injection Generic drug: EPINEPHrine USE FOR ANAPHYLAXIS   fluticasone 50 MCG/ACT nasal spray Commonly known as: FLONASE Place 1 spray into both nostrils daily as needed.   imipramine 25 MG tablet Commonly known as: TOFRANIL Take 50 mg by mouth at bedtime.   levothyroxine 88 MCG tablet Commonly known as: SYNTHROID Take 88 mcg by mouth daily before breakfast. Take one tablet in the morning M-F   lovastatin 20 MG tablet Commonly known as: MEVACOR Take 20 mg by mouth at bedtime.   metoprolol succinate 50 MG 24 hr tablet Commonly known as: TOPROL-XL Take 50 mg by mouth daily. Take with or immediately following a meal.   montelukast 10 MG tablet Commonly known as: SINGULAIR Take 10 mg by mouth at bedtime.   Multi-Vitamins Tabs Take by mouth.   nitrofurantoin (macrocrystal-monohydrate) 100 MG capsule Commonly known as: MACROBID Take 1 capsule (100 mg total) by mouth 2 (two) times daily as needed. Take one after intercourse       Allergies:  Allergies  Allergen Reactions  . Amoxicillin   . Bextra [Valdecoxib]   . Ciprofloxacin   . Floxin [Ofloxacin]   . Gyne-Lotrimin [Clotrimazole]   . Levofloxacin   . Quinolones Other (See Comments)  . Septra [Sulfamethoxazole-Trimethoprim] Other (See Comments)    Flushed and fever.  . Sulfa Antibiotics Other (See Comments)    fever  . Penicillins Rash    Family History: Family History  Problem Relation Age of Onset  . Cancer Mother   . Hyperlipidemia Mother   . Heart disease Mother   . Hypertension Mother   . Breast cancer Mother 5  . Kidney failure Mother   . Stroke Mother   . Hyperlipidemia Father   . Heart disease Father   . Hypertension Father   . Cancer Father         colon  . Breast cancer Paternal Aunt 11  . Breast cancer Maternal Aunt 85  . Kidney cancer Neg Hx   . Bladder Cancer Neg Hx   . Prostate cancer Neg Hx   . Ovarian cancer Neg Hx   . Diabetes Neg Hx     Social History:  reports that she has never smoked. She has never used smokeless tobacco. She reports that she does not drink alcohol and does not use drugs.  ROS: For pertinent review of systems please refer to history of present illness  Physical Exam: BP 113/76 (BP Location: Left Arm, Patient Position: Sitting, Cuff Size: Normal)   Pulse (!) 102   Ht 5' 2" (  1.575 m)   Wt 130 lb (59 kg)   LMP 07/01/2014 (Exact Date)   BMI 23.78 kg/m   Constitutional:  Well nourished. Alert and oriented, No acute distress. HEENT:  AT, mask in place.  Trachea midline Cardiovascular: No clubbing, cyanosis, or edema. Respiratory: Normal respiratory effort, no increased work of breathing. Neurologic: Grossly intact, no focal deficits, moving all 4 extremities. Psychiatric: Normal mood and affect.   Laboratory Data: Component     Latest Ref Rng & Units 06/25/2020  Specific Gravity, UA     1.005 - 1.030 1.010  pH, UA     5.0 - 7.5 6.5  Color, UA     Yellow Yellow  Appearance Ur     Clear Clear  Leukocytes,UA     Negative Negative  Protein,UA     Negative/Trace Negative  Glucose, UA     Negative Negative  Ketones, UA     Negative Negative  RBC, UA     Negative Negative  Bilirubin, UA     Negative Negative  Urobilinogen, Ur     0.2 - 1.0 mg/dL 0.2  Nitrite, UA     Negative Negative  Microscopic Examination      See below:   Component     Latest Ref Rng & Units 06/25/2020  WBC, UA     0 - 5 /hpf None seen  RBC     0 - 2 /hpf None seen  Epithelial Cells (non renal)     0 - 10 /hpf None seen  Bacteria, UA     None seen/Few None seen   Specimen:  Blood  Ref Range & Units 2 mo ago  Hemoglobin A1C 4.2 - 5.6 % 5.7High      Average Blood Glucose (Calc) mg/dL 117        Resulting Agency  [facility] - [facility]  Narrative Performed by [facility] Normal Range:  4.2 - 5.6%  Increased Risk: 5.7 - 6.4%  Diabetes:    >= 6.5%  Glycemic Control for adults with diabetes: <7%  Specimen Collected: 04/18/20 7:45 AM Last Resulted: 04/18/20 11:31 AM  Received From: [facility]  Result Received: 05/07/20 9:20 AM   Specimen:  Blood  Ref Range & Units 2 mo ago  Glucose 70 - 110 mg/dL 83      Sodium 136 - 145 mmol/L 141      Potassium 3.6 - 5.1 mmol/L 4.7      Chloride 97 - 109 mmol/L 104      Carbon Dioxide (CO2) 22.0 - 32.0 mmol/L 32.8High      Urea Nitrogen (BUN) 7 - 25 mg/dL 10      Creatinine 0.6 - 1.1 mg/dL 0.8      Glomerular Filtration Rate (eGFR), MDRD Estimate >60 mL/min/1.73sq m 74      Calcium 8.7 - 10.3 mg/dL 9.7      AST  8 - 39 U/L 22      ALT  5 - 38 U/L 24      Alk Phos (alkaline Phosphatase) 34 - 104 U/L 60      Albumin 3.5 - 4.8 g/dL 4.2      Bilirubin, Total 0.3 - 1.2 mg/dL 0.6      Protein, Total 6.1 - 7.9 g/dL 6.1      A/G Ratio 1.0 - 5.0 gm/dL 2.2      Resulting Agency  [facility] - [facility]  Specimen Collected: 04/18/20 7:45  AM Last Resulted: 04/18/20 11:44 AM  Received From: [facility] [place]  Result Received: 05/07/20 9:20 AM     Pertinent Imaging: Results for NAYAH, LUKENS (MRN [id number]) as of 07/15/2020 15:29  Ref. Range 06/25/2020 13:53  Scan Result Unknown 44m   Assessment & Plan:    1. Recurrent UTI's 2 infections over the last year Patient to continue postcoital nitrofurantoin Patient to report any breakthrough urinary tract infections Patient to return next year for recheck  2. Vaginal atrophy Cannot use vaginal estrogen cream due to endometrial bleeding  3. Cystocele Not bothersome                                  Return in about 1 year (around 06/25/2021) for PVR and OAB questionnaire.  These notes generated with voice  recognition software. I apologize for typographical errors.  SZara Council PA-C  [facility] [address] [phone]

## 2020-06-25 ENCOUNTER — Encounter: Payer: Self-pay | Admitting: Urology

## 2020-06-25 ENCOUNTER — Other Ambulatory Visit: Payer: Self-pay

## 2020-06-25 ENCOUNTER — Ambulatory Visit (INDEPENDENT_AMBULATORY_CARE_PROVIDER_SITE_OTHER): Payer: 59 | Admitting: Urology

## 2020-06-25 ENCOUNTER — Other Ambulatory Visit: Payer: Self-pay | Admitting: Urology

## 2020-06-25 VITALS — BP 113/76 | HR 102 | Ht 62.0 in | Wt 130.0 lb

## 2020-06-25 DIAGNOSIS — N393 Stress incontinence (female) (male): Secondary | ICD-10-CM | POA: Diagnosis not present

## 2020-06-25 DIAGNOSIS — N8111 Cystocele, midline: Secondary | ICD-10-CM | POA: Diagnosis not present

## 2020-06-25 DIAGNOSIS — N952 Postmenopausal atrophic vaginitis: Secondary | ICD-10-CM

## 2020-06-25 DIAGNOSIS — Z87440 Personal history of urinary (tract) infections: Secondary | ICD-10-CM | POA: Diagnosis not present

## 2020-06-25 LAB — BLADDER SCAN AMB NON-IMAGING

## 2020-06-25 MED ORDER — NITROFURANTOIN MONOHYD MACRO 100 MG PO CAPS: 100.0000 mg | ORAL_CAPSULE | Freq: Two times a day (BID) | ORAL | 0 refills | Status: DC | PRN

## 2020-06-26 ENCOUNTER — Other Ambulatory Visit: Payer: Self-pay | Admitting: Family Medicine

## 2020-06-27 DIAGNOSIS — Z8639 Personal history of other endocrine, nutritional and metabolic disease: Secondary | ICD-10-CM | POA: Diagnosis not present

## 2020-06-27 LAB — URINALYSIS, COMPLETE
Bilirubin, UA: NEGATIVE
Glucose, UA: NEGATIVE
Ketones, UA: NEGATIVE
Leukocytes,UA: NEGATIVE
Nitrite, UA: NEGATIVE
Protein,UA: NEGATIVE
RBC, UA: NEGATIVE
Specific Gravity, UA: 1.01 (ref 1.005–1.030)
Urobilinogen, Ur: 0.2 mg/dL (ref 0.2–1.0)
pH, UA: 6.5 (ref 5.0–7.5)

## 2020-06-27 LAB — MICROSCOPIC EXAMINATION
Bacteria, UA: NONE SEEN
Epithelial Cells (non renal): NONE SEEN /hpf (ref 0–10)
RBC, Urine: NONE SEEN /hpf (ref 0–2)
WBC, UA: NONE SEEN /hpf (ref 0–5)

## 2020-07-09 DIAGNOSIS — M159 Polyosteoarthritis, unspecified: Secondary | ICD-10-CM | POA: Diagnosis not present

## 2020-07-09 DIAGNOSIS — G5603 Carpal tunnel syndrome, bilateral upper limbs: Secondary | ICD-10-CM | POA: Diagnosis not present

## 2020-10-17 DIAGNOSIS — E7800 Pure hypercholesterolemia, unspecified: Secondary | ICD-10-CM | POA: Diagnosis not present

## 2020-10-17 DIAGNOSIS — R7303 Prediabetes: Secondary | ICD-10-CM | POA: Diagnosis not present

## 2020-10-22 ENCOUNTER — Other Ambulatory Visit: Payer: Self-pay | Admitting: Family Medicine

## 2020-10-22 DIAGNOSIS — Z8639 Personal history of other endocrine, nutritional and metabolic disease: Secondary | ICD-10-CM | POA: Diagnosis not present

## 2020-10-22 DIAGNOSIS — R7303 Prediabetes: Secondary | ICD-10-CM | POA: Diagnosis not present

## 2020-10-22 DIAGNOSIS — Z0000 Encounter for general adult medical examination without abnormal findings: Secondary | ICD-10-CM | POA: Diagnosis not present

## 2020-10-30 ENCOUNTER — Other Ambulatory Visit: Payer: Self-pay | Admitting: Family Medicine

## 2020-11-05 DIAGNOSIS — Z85828 Personal history of other malignant neoplasm of skin: Secondary | ICD-10-CM | POA: Diagnosis not present

## 2020-11-05 DIAGNOSIS — L821 Other seborrheic keratosis: Secondary | ICD-10-CM | POA: Diagnosis not present

## 2020-11-05 DIAGNOSIS — L538 Other specified erythematous conditions: Secondary | ICD-10-CM | POA: Diagnosis not present

## 2020-11-05 DIAGNOSIS — D485 Neoplasm of uncertain behavior of skin: Secondary | ICD-10-CM | POA: Diagnosis not present

## 2020-11-05 DIAGNOSIS — L820 Inflamed seborrheic keratosis: Secondary | ICD-10-CM | POA: Diagnosis not present

## 2020-11-05 DIAGNOSIS — C44719 Basal cell carcinoma of skin of left lower limb, including hip: Secondary | ICD-10-CM | POA: Diagnosis not present

## 2020-11-05 DIAGNOSIS — D225 Melanocytic nevi of trunk: Secondary | ICD-10-CM | POA: Diagnosis not present

## 2020-11-12 DIAGNOSIS — I471 Supraventricular tachycardia: Secondary | ICD-10-CM | POA: Diagnosis not present

## 2020-11-12 DIAGNOSIS — E7800 Pure hypercholesterolemia, unspecified: Secondary | ICD-10-CM | POA: Diagnosis not present

## 2020-12-10 DIAGNOSIS — C44719 Basal cell carcinoma of skin of left lower limb, including hip: Secondary | ICD-10-CM | POA: Diagnosis not present

## 2020-12-22 ENCOUNTER — Other Ambulatory Visit: Payer: Self-pay

## 2020-12-22 MED ORDER — METOPROLOL SUCCINATE ER 50 MG PO TB24
50.0000 mg | ORAL_TABLET | Freq: Every day | ORAL | 3 refills | Status: DC
  Filled 2020-12-22: qty 90, 90d supply, fill #0
  Filled 2021-03-19: qty 90, 90d supply, fill #1
  Filled 2021-06-18: qty 90, 90d supply, fill #2
  Filled 2021-09-16: qty 90, 90d supply, fill #3

## 2020-12-23 ENCOUNTER — Other Ambulatory Visit: Payer: Self-pay

## 2020-12-23 MED ORDER — IMIPRAMINE HCL 25 MG PO TABS
ORAL_TABLET | ORAL | 1 refills | Status: DC
  Filled 2020-12-23: qty 180, 90d supply, fill #0
  Filled 2021-03-28: qty 180, 90d supply, fill #1

## 2020-12-26 DIAGNOSIS — H5213 Myopia, bilateral: Secondary | ICD-10-CM | POA: Diagnosis not present

## 2020-12-26 DIAGNOSIS — H35371 Puckering of macula, right eye: Secondary | ICD-10-CM | POA: Diagnosis not present

## 2021-01-21 ENCOUNTER — Other Ambulatory Visit: Payer: Self-pay

## 2021-01-21 MED FILL — Lovastatin Tab 20 MG: ORAL | 90 days supply | Qty: 90 | Fill #0 | Status: AC

## 2021-01-21 MED FILL — Montelukast Sodium Tab 10 MG (Base Equiv): ORAL | 90 days supply | Qty: 90 | Fill #0 | Status: AC

## 2021-02-03 MED FILL — Levothyroxine Sodium Tab 88 MCG: ORAL | 90 days supply | Qty: 90 | Fill #0 | Status: AC

## 2021-02-04 ENCOUNTER — Other Ambulatory Visit: Payer: Self-pay

## 2021-02-24 NOTE — Progress Notes (Signed)
Rito Ehrlich, am serving as a Education administrator for Dr. Lynne Leader.   [name] is a 60 y.o. female who presents to [place] at [facility] today for R knee pain. Pt was last seen by Dr. Georgina Snell on 03/12/20 and was advised to modify activity, recommended cycling, Voltaren gel, compressive knee sleeve, and quad strengthening. Today, pt reports that she is having pain in her  R medial knee but can be in the back of the knee as well. The pain only happens when walking. 2 weeks ago patient had a really bad pain in the posterior lateral aspect of her knee that she thought it was the baker cyst.   R knee swelling: yes two weeks Aggravates: walking Treatments tried: compression sleeve  Dx imaging: 03/12/20 R knee XR  Pertinent review of systems: No fevers or chills  Relevant historical information: History of Baker's cyst evaluated about a year ago   Exam:  BP 110/80 (BP Location: Left Arm, Patient Position: Sitting, Cuff Size: Normal)   Pulse 87   Ht 5\' 2"  (1.575 m)   Wt 134 lb (60.8 kg)   LMP 07/01/2014 (Exact Date)   SpO2 95%   BMI 24.51 kg/m  General: Well Developed, well nourished, and in no acute distress.   MSK: Right knee mild effusion otherwise normal. Normal motion. Tender palpation anterior knee and distal quad tendon insertion on patella. Nontender joint line. Nontender posterior knee. Stable ligamentous exam. Intact strength.     Lab and Radiology Results  Diagnostic Limited MSK Ultrasound of: Right knee Quad tendon is intact however slight irregularity at distal quad tendon insertion indicates quadriceps tendinitis. Moderate joint effusion present superior patellar space. Patellar tendon intact normal-appearing Medial joint line narrowed degenerative appearing medial meniscus. Lateral joint line normal-appearing Posterior knee reveals moderate Baker's cyst medial aspect. Normal-appearing posterior lateral knee. Impression: Quad  tendinitis.  Moderate joint effusion.  Moderate Baker's cyst.  Narrowed medial joint line  Procedure: Real-time Ultrasound Guided Injection of right knee superior patellar space Device: Philips Affiniti 50G Images permanently stored and available for review in PACS Verbal informed consent obtained.  Discussed risks and benefits of procedure. Warned about infection bleeding damage to structures skin hypopigmentation and fat atrophy among others. Patient expresses understanding and agreement Time-out conducted.   Noted no overlying erythema, induration, or other signs of local infection.   Skin prepped in a sterile fashion.   Local anesthesia: Topical Ethyl chloride.   With sterile technique and under real time ultrasound guidance: 40 mg of Kenalog and 2 mL of Marcaine injected into knee joint. Fluid seen entering the joint capsule.   Completed without difficulty   Pain immediately resolved suggesting accurate placement of the medication.   Advised to call if fevers/chills, erythema, induration, drainage, or persistent bleeding.   Images permanently stored and available for review in the ultrasound unit.  Impression: Technically successful ultrasound guided injection.   EXAM: RIGHT KNEE 3 VIEWS   COMPARISON:  None.   FINDINGS: No evidence of fracture, dislocation, or joint effusion. No evidence of arthropathy or other focal bone abnormality. Soft tissues are unremarkable.   IMPRESSION: Negative.     Electronically Signed   By: Marijo Conception M.D.   On: 03/13/2020 16:45    I, Lynne Leader, personally (independently) visualized and performed the interpretation of the images attached in this note.     Assessment and Plan: 60 y.o. female with right knee pain multifactorial.  Patient has a joint  effusion and Baker's cyst probably because of the medial compartment degenerative changes seen on ultrasound today.  I would not be surprised if she does have a degenerative medial  meniscus tear not appreciated on ultrasound driving the effusion and Baker's cyst.  She does have a moderately large Baker's cyst today however that is not really the main location of her pain therefore I do not think aspirating and injecting the Baker's cyst is going to treat her pain as well as a intra-articular knee injection well.  Additionally she has some evidence of quad tendinitis both clinically and on ultrasound.  We will work on home exercise program to address quad tendinitis as well.  Recheck back in about 6 weeks.  If not improved would consider MRI to further characterize causes of knee pain and to further determine treatment plan and options.   PDMP not reviewed this encounter. Orders Placed This Encounter  Procedures   [place] LIMITED JOINT SPACE STRUCTURES LOW RIGHT(NO LINKED CHARGES)    Standing Status:   Future    Number of Occurrences:   1    Standing Expiration Date:   02/25/2022    Order Specific Question:   Reason for Exam (SYMPTOM  OR DIAGNOSIS REQUIRED)    Answer:   Right knee pain    Order Specific Question:   Preferred imaging location?    Answer:   Internal   No orders of the defined types were placed in this encounter.    Discussed warning signs or symptoms. Please see discharge instructions. Patient expresses understanding.   The above documentation has been reviewed and is accurate and complete Lynne Leader, M.D.

## 2021-02-25 ENCOUNTER — Ambulatory Visit: Payer: Self-pay

## 2021-02-25 ENCOUNTER — Other Ambulatory Visit: Payer: Self-pay

## 2021-02-25 ENCOUNTER — Encounter: Payer: Self-pay | Admitting: Family Medicine

## 2021-02-25 ENCOUNTER — Ambulatory Visit (INDEPENDENT_AMBULATORY_CARE_PROVIDER_SITE_OTHER): Payer: 59 | Admitting: Family Medicine

## 2021-02-25 VITALS — BP 110/80 | HR 87 | Ht 62.0 in | Wt 134.0 lb

## 2021-02-25 DIAGNOSIS — M25561 Pain in right knee: Secondary | ICD-10-CM | POA: Diagnosis not present

## 2021-02-25 NOTE — Patient Instructions (Signed)
Thank you for coming in today.   Call or go to the ER if you develop a large red swollen joint with extreme pain or oozing puss.    Do the QT exercises in the handout.   If not improving let me know. Next step would be MRI.   Quadriceps Tendon Tear Rehab Ask your health care provider which exercises are safe for you. Do exercises exactly as told by your health care provider and adjust them as directed. It is normal to feel mild stretching, pulling, tightness, or discomfort as you do these exercises. Stop right away if you feel sudden pain or your pain gets worse. Do not begin these exercises until told by your health care provider. Stretching and range-of-motion exercises These exercises warm up your muscles and joints and improve the movement and flexibility of your thigh. These exercises can also help to relieve pain,numbness, and tingling. Knee flexion, active This is an exercise in which you use your effort to move your knee joint (active flexion). Lie on your back with both legs straight. If this causes back discomfort, bend your healthy knee so your foot is flat on the floor. Slowly slide your left / right heel back toward your buttocks (flexion). Stop when you feel a gentle stretch in the front of your knee or thigh (quadriceps). Hold this position for __________ seconds. Slowly slide your left / right heel back to the starting position. Repeat __________ times. Complete this exercise __________ times a day. Knee extension, passive This is an exercise in which your knee joint is straightened with no effort on your part (passive extension). Sit with your left / right heel propped on a chair, a coffee table, or a footstool. Do not have anything under your knee to support it. Allow your leg muscles to relax, letting gravity straighten out your knee. You should feel a stretch behind your left / right knee. If told by your health care provider, deepen the stretch by placing a __________ lb  weight on your thigh, just above your kneecap. Hold this position for __________ seconds. Repeat __________ times. Complete this exercise __________ times a day. Strengthening exercises These exercises build strength and endurance in your thigh. Endurance is the ability to use your muscles for a long time, even after your muscles get tired. Do these exercises while wearing your brace if told by your health careprovider. Quadriceps, isometric This is an exercise in which you stretch the muscles in front of your thigh (quadriceps) without moving your knee joint (isometric). Lie on your back with your left / right leg extended and your other knee bent. If told by your health care provider, put a small pillow or rolled towel under your extended knee. Slowly tense the muscles in the front of your left / right thigh. You should see your kneecap slide up toward your hip or see increased dimpling just above the knee. This motion will push the back of your knee down toward the floor. For __________ seconds, hold the muscle as tight as you can without increasing your pain. Relax the muscles slowly and completely after each repetition. Repeat __________ times. Complete this exercise __________ times a day. Straight leg raises, supine This exercise stretches the muscles in front of your thigh (quadriceps) and the muscles that move your hips (hip flexors). Lie on your back (supine position) with your left / right leg extended and your other knee bent. Tense the muscles in the front of your left / right thigh.  You should see your kneecap slide up or see increased dimpling just above the knee. Keep these muscles tight while you raise your leg 4-6 inches (10-15 cm) off the floor. Do not let your knee bend. Hold this position for __________ seconds. Keep the muscles tense and keep your knee straight as you lower your leg. Relax the muscles slowly and completely after each repetition. Repeat __________ times.  Complete this exercise __________ times a day. Wall slides This exercise is sometimes called knee and hip extensor exercise. Lean your back against a smooth wall or door, and walk your feet out 18-24 inches (46-61 cm) from it. Place your feet hip-width apart. Slowly slide down the wall or door until your knees bend __________ degrees. Do not bend your knees farther than told by your health care provider. Keep your knees over your heels, not over your toes. Keep your knees in line with your hips. Hold this position for __________ seconds. Stand up to rest for __________ seconds after each repetition. Repeat __________ times. Complete this exercise __________ times a day. This information is not intended to replace advice given to you by your health care provider. Make sure you discuss any questions you have with your healthcare provider. Document Revised: 12/01/2018 Document Reviewed: 06/01/2018 Elsevier Patient Education  2022 [employer].

## 2021-03-19 ENCOUNTER — Other Ambulatory Visit: Payer: Self-pay

## 2021-03-30 ENCOUNTER — Other Ambulatory Visit: Payer: Self-pay

## 2021-03-31 ENCOUNTER — Other Ambulatory Visit: Payer: Self-pay

## 2021-03-31 MED ORDER — IMIPRAMINE HCL 25 MG PO TABS
ORAL_TABLET | ORAL | 1 refills | Status: DC
  Filled 2021-03-31: qty 180, 90d supply, fill #0

## 2021-04-20 ENCOUNTER — Other Ambulatory Visit: Payer: Self-pay

## 2021-04-20 MED ORDER — LOVASTATIN 20 MG PO TABS
ORAL_TABLET | ORAL | 1 refills | Status: DC
  Filled 2021-04-20: qty 90, 90d supply, fill #0
  Filled 2021-07-20: qty 90, 90d supply, fill #1

## 2021-04-22 DIAGNOSIS — C44712 Basal cell carcinoma of skin of right lower limb, including hip: Secondary | ICD-10-CM | POA: Diagnosis not present

## 2021-04-22 DIAGNOSIS — D485 Neoplasm of uncertain behavior of skin: Secondary | ICD-10-CM | POA: Diagnosis not present

## 2021-04-22 DIAGNOSIS — D2372 Other benign neoplasm of skin of left lower limb, including hip: Secondary | ICD-10-CM | POA: Diagnosis not present

## 2021-04-22 DIAGNOSIS — D225 Melanocytic nevi of trunk: Secondary | ICD-10-CM | POA: Diagnosis not present

## 2021-04-22 DIAGNOSIS — C44719 Basal cell carcinoma of skin of left lower limb, including hip: Secondary | ICD-10-CM | POA: Diagnosis not present

## 2021-04-22 DIAGNOSIS — L821 Other seborrheic keratosis: Secondary | ICD-10-CM | POA: Diagnosis not present

## 2021-04-24 DIAGNOSIS — Z13220 Encounter for screening for lipoid disorders: Secondary | ICD-10-CM | POA: Diagnosis not present

## 2021-04-24 DIAGNOSIS — Z0000 Encounter for general adult medical examination without abnormal findings: Secondary | ICD-10-CM | POA: Diagnosis not present

## 2021-04-24 DIAGNOSIS — R7303 Prediabetes: Secondary | ICD-10-CM | POA: Diagnosis not present

## 2021-04-29 ENCOUNTER — Other Ambulatory Visit: Payer: Self-pay

## 2021-04-29 DIAGNOSIS — Z8639 Personal history of other endocrine, nutritional and metabolic disease: Secondary | ICD-10-CM | POA: Diagnosis not present

## 2021-04-29 DIAGNOSIS — E7800 Pure hypercholesterolemia, unspecified: Secondary | ICD-10-CM | POA: Diagnosis not present

## 2021-04-29 DIAGNOSIS — Z0000 Encounter for general adult medical examination without abnormal findings: Secondary | ICD-10-CM | POA: Diagnosis not present

## 2021-04-29 DIAGNOSIS — R7303 Prediabetes: Secondary | ICD-10-CM | POA: Diagnosis not present

## 2021-04-29 MED ORDER — LEVOTHYROXINE SODIUM 88 MCG PO TABS
ORAL_TABLET | ORAL | 1 refills | Status: DC
  Filled 2021-04-29: qty 90, 90d supply, fill #0
  Filled 2021-08-02: qty 90, 90d supply, fill #1

## 2021-04-29 MED FILL — Montelukast Sodium Tab 10 MG (Base Equiv): ORAL | 90 days supply | Qty: 90 | Fill #1 | Status: AC

## 2021-05-13 ENCOUNTER — Ambulatory Visit (INDEPENDENT_AMBULATORY_CARE_PROVIDER_SITE_OTHER): Payer: 59 | Admitting: Obstetrics and Gynecology

## 2021-05-13 ENCOUNTER — Encounter: Payer: Self-pay | Admitting: Obstetrics and Gynecology

## 2021-05-13 ENCOUNTER — Other Ambulatory Visit: Payer: Self-pay

## 2021-05-13 VITALS — BP 123/80 | HR 89 | Resp 16 | Ht 62.0 in | Wt 128.2 lb

## 2021-05-13 DIAGNOSIS — E039 Hypothyroidism, unspecified: Secondary | ICD-10-CM

## 2021-05-13 DIAGNOSIS — Z01419 Encounter for gynecological examination (general) (routine) without abnormal findings: Secondary | ICD-10-CM | POA: Diagnosis not present

## 2021-05-13 DIAGNOSIS — R7303 Prediabetes: Secondary | ICD-10-CM

## 2021-05-13 DIAGNOSIS — Z124 Encounter for screening for malignant neoplasm of cervix: Secondary | ICD-10-CM | POA: Diagnosis not present

## 2021-05-13 DIAGNOSIS — Z809 Family history of malignant neoplasm, unspecified: Secondary | ICD-10-CM

## 2021-05-13 DIAGNOSIS — Z1231 Encounter for screening mammogram for malignant neoplasm of breast: Secondary | ICD-10-CM | POA: Diagnosis not present

## 2021-05-13 NOTE — Patient Instructions (Addendum)
Preventive Care 40-59 Years Old, Female Preventive care refers to lifestyle choices and visits with your health care provider that can promote health and wellness. This includes: A yearly physical exam. This is also called an annual wellness visit. Regular dental and eye exams. Immunizations. Screening for certain conditions. Healthy lifestyle choices, such as: Eating a healthy diet. Getting regular exercise. Not using drugs or products that contain nicotine and tobacco. Limiting alcohol use. What can I expect for my preventive care visit? Physical exam Your health care provider will check your: Height and weight. These may be used to calculate your BMI (body mass index). BMI is a measurement that tells if you are at a healthy weight. Heart rate and blood pressure. Body temperature. Skin for abnormal spots. Counseling Your health care provider may ask you questions about your: Past medical problems. Family's medical history. Alcohol, tobacco, and drug use. Emotional well-being. Home life and relationship well-being. Sexual activity. Diet, exercise, and sleep habits. Work and work environment. Access to firearms. Method of birth control. Menstrual cycle. Pregnancy history. What immunizations do I need? Vaccines are usually given at various ages, according to a schedule. Your health care provider will recommend vaccines for you based on your age, medical history, and lifestyle or other factors, such as travel or where you work. What tests do I need? Blood tests Lipid and cholesterol levels. These may be checked every 5 years, or more often if you are over 50 years old. Hepatitis C test. Hepatitis B test. Screening Lung cancer screening. You may have this screening every year starting at age 55 if you have a 30-pack-year history of smoking and currently smoke or have quit within the past 15 years. Colorectal cancer screening. All adults should have this screening starting at  age 50 and continuing until age 75. Your health care provider may recommend screening at age 45 if you are at increased risk. You will have tests every 1-10 years, depending on your results and the type of screening test. Diabetes screening. This is done by checking your blood sugar (glucose) after you have not eaten for a while (fasting). You may have this done every 1-3 years. Mammogram. This may be done every 1-2 years. Talk with your health care provider about when you should start having regular mammograms. This may depend on whether you have a family history of breast cancer. BRCA-related cancer screening. This may be done if you have a family history of breast, ovarian, tubal, or peritoneal cancers. Pelvic exam and Pap test. This may be done every 3 years starting at age 21. Starting at age 30, this may be done every 5 years if you have a Pap test in combination with an HPV test. Other tests STD (sexually transmitted disease) testing, if you are at risk. Bone density scan. This is done to screen for osteoporosis. You may have this scan if you are at high risk for osteoporosis. Talk with your health care provider about your test results, treatment options, and if necessary, the need for more tests. Follow these instructions at home: Eating and drinking  Eat a diet that includes fresh fruits and vegetables, whole grains, lean protein, and low-fat dairy products. Take vitamin and mineral supplements as recommended by your health care provider. Do not drink alcohol if: Your health care provider tells you not to drink. You are pregnant, may be pregnant, or are planning to become pregnant. If you drink alcohol: Limit how much you have to 0-1 drink a day. Be   aware of how much alcohol is in your drink. In the [place] one drink equals one 12 oz bottle of beer (355 mL), one 5 oz glass of wine (148 mL), or one 1 oz glass of hard liquor (44 mL). Lifestyle Take daily care of your teeth and  gums. Brush your teeth every morning and night with fluoride toothpaste. Floss one time each day. Stay active. Exercise for at least 30 minutes 5 or more days each week. Do not use any products that contain nicotine or tobacco, such as cigarettes, e-cigarettes, and chewing tobacco. If you need help quitting, ask your health care provider. Do not use drugs. If you are sexually active, practice safe sex. Use a condom or other form of protection to prevent STIs (sexually transmitted infections). If you do not wish to become pregnant, use a form of birth control. If you plan to become pregnant, see your health care provider for a prepregnancy visit. If told by your health care provider, take low-dose aspirin daily starting at age 63. Find healthy ways to cope with stress, such as: Meditation, yoga, or listening to music. Journaling. Talking to a trusted person. Spending time with friends and family. Safety Always wear your seat belt while driving or riding in a vehicle. Do not drive: If you have been drinking alcohol. Do not ride with someone who has been drinking. When you are tired or distracted. While texting. Wear a helmet and other protective equipment during sports activities. If you have firearms in your house, make sure you follow all gun safety procedures. What's next? Visit your health care provider once a year for an annual wellness visit. Ask your health care provider how often you should have your eyes and teeth checked. Stay up to date on all vaccines. This information is not intended to replace advice given to you by your health care provider. Make sure you discuss any questions you have with your health care provider. Document Revised: 10/17/2020 Document Reviewed: 04/20/2018 Elsevier Patient Education  2022 [employer].

## 2021-05-13 NOTE — Progress Notes (Signed)
GYNECOLOGY ANNUAL PHYSICAL EXAM PROGRESS NOTE  Subjective:    [name] is a 60 y.o. G46P2002 female who presents for an annual exam. The patient is sexually active. The patient has never taken hormone replacement therapy. Patient denies post-menopausal vaginal bleeding. The patient wears seatbelts: no. The patient participates in regular exercise: yes (5 x weekly for 45 minutes, runs/jogs). Has the patient ever been transfused or tattooed?: yes, professional.  Current Complaints: None. Notes menopausal hot flushes have improved.     Gynecologic History:  Patient's last menstrual period was 07/01/2014 (exact date). Contraception: post menopausal status History of STI's: No Last Pap: 05/07/2020. Results were: normal.  Denies h/o abnormal pap smears. Last mammogram: 06/18/2020. Results were: normal Last Colonoscopy: 06/2018.  Results were normal. Repeat q 5 years due to polyps and family history of colon cancer (father).  Last Dexa Scan: patient has never had one   OB History  Gravida Para Term Preterm AB Living  2 2 2  0 0 2  SAB IAB Ectopic Multiple Live Births  0 0 0 0 2    # Outcome Date GA Lbr Len/2nd Weight Sex Delivery Anes PTL Lv  2 Term 05/22/95    F Vag-Spont   LIV  1 Term 03/22/91    M Vag-Spont   LIV    Past Medical History:  Diagnosis Date   Allergy    Arthritis    osteoarthritis - fingers   Asthma    Chicken pox    Chronic UTI    Cystocele    2nd degree   Endometriosis    Family history of polyps in the colon    Graves disease    History of heavy periods    Hyperlipemia    IBS (irritable bowel syndrome)    Perimenopausal    PONV (postoperative nausea and vomiting)    nausea only   Rectocele    mild   S/P endometrial ablation    Scoliosis    Skin cancer    SUI (stress urinary incontinence, female)    SVT (supraventricular tachycardia) ([place])    Urine incontinence    Wears contact lenses     Past Surgical History:  Procedure Laterality  Date   COLONOSCOPY WITH PROPOFOL N/A 07/10/2018   Procedure: COLONOSCOPY WITH PROPOFOL;  Surgeon: Manya Silvas, MD;  Location[facility] ENDOSCOPY;  Service: Endoscopy;  Laterality: N/A;   endometrrial ablation     EYE SURGERY     knee athroscopy Left 2013   laprascopic surgery     endometreosis   NASOPHARYNGOSCOPY EUSTATION TUBE BALLOON DILATION Bilateral 05/18/2018   Procedure: NASOPHARYNGOSCOPY EUSTATION TUBE BALLOON DILATION OUTFRACTURE BILATERAL INFERIOR TURBINATES;  Surgeon: Margaretha Sheffield, MD;  Location: [facility];  Service: ENT;  Laterality: Bilateral;   NASOPHARYNGOSCOPY EUSTATION TUBE BALLOON DILATION     nova-sure     THYROIDECTOMY, PARTIAL     TONSILLECTOMY AND ADENOIDECTOMY     tubes in ears      Family History  Problem Relation Age of Onset   Cancer Mother    Hyperlipidemia Mother    Heart disease Mother    Hypertension Mother    Breast cancer Mother 39   Kidney failure Mother    Stroke Mother    Hyperlipidemia Father    Heart disease Father    Hypertension Father    Cancer Father        colon   Breast cancer Paternal Aunt 19   Breast cancer Maternal Aunt 71  Kidney cancer Neg Hx    Bladder Cancer Neg Hx    Prostate cancer Neg Hx    Ovarian cancer Neg Hx    Diabetes Neg Hx     Social History   Socioeconomic History   Marital status: Married    Spouse name: Not on file   Number of children: Not on file   Years of education: Not on file   Highest education level: Not on file  Occupational History   Occupation: [employer]  Tobacco Use   Smoking status: Never   Smokeless tobacco: Never  Vaping Use   Vaping Use: Never used  Substance and Sexual Activity   Alcohol use: No    Alcohol/week: 0.0 standard drinks   Drug use: No   Sexual activity: Yes    Birth control/protection: Post-menopausal  Other Topics Concern   Not on file  Social History Narrative   Not on file   Social Determinants of Health   Financial Resource Strain: Not  on file  Food Insecurity: Not on file  Transportation Needs: Not on file  Physical Activity: Not on file  Stress: Not on file  Social Connections: Not on file  Intimate Partner Violence: Not on file    Current Outpatient Medications on File Prior to Visit  Medication Sig Dispense Refill   albuterol (PROVENTIL HFA;VENTOLIN HFA) 108 (90 BASE) MCG/ACT inhaler Inhale 2 puffs into the lungs every 6 (six) hours as needed for wheezing or shortness of breath.     aspirin 81 MG tablet Take 81 mg by mouth daily.     cholecalciferol (VITAMIN D) 1000 UNITS tablet Take 1,000 Units by mouth daily.     CRANBERRY PO Take by mouth.     EPINEPHrine 0.3 mg/0.3 mL IJ SOAJ injection INJECT 0.3 MLS (0.3 MG TOTAL) INTO THE MUSCLE ONCE AS NEEDED FOR ANAPHYLAXIS FOR UP TO 1 DOSE 2 each 2   EPIPEN 2-PAK 0.3 MG/0.3ML SOAJ injection USE FOR ANAPHYLAXIS 2 Device 3   fexofenadine (ALLEGRA) 180 MG tablet Allegra Allergy 180 mg tablet  Take 1 tablet every day by oral route.     fluticasone (FLONASE) 50 MCG/ACT nasal spray Place 1 spray into both nostrils daily as needed.      imipramine (TOFRANIL) 25 MG tablet Take 50 mg by mouth at bedtime.     imipramine (TOFRANIL) 25 MG tablet TAKE 2 TABLETS (50 MG) BY MOUTH NIGHTLY 180 tablet 1   imipramine (TOFRANIL) 25 MG tablet Take 2 tablets (50 mg total) by mouth at bedtime 180 tablet 1   levothyroxine (SYNTHROID) 88 MCG tablet Take 88 mcg by mouth daily before breakfast. Take one tablet in the morning M-F     levothyroxine (SYNTHROID) 88 MCG tablet TAKE 1 TABLET BY MOUTH ONCE DAILY 90 tablet 1   levothyroxine (SYNTHROID) 88 MCG tablet Take 1 tablet (88 mcg total) by mouth once daily 90 tablet 1   lovastatin (MEVACOR) 20 MG tablet Take 20 mg by mouth at bedtime.     lovastatin (MEVACOR) 20 MG tablet TAKE 1 TABLET BY MOUTH DAILY WITH DINNER 90 tablet 1   lovastatin (MEVACOR) 20 MG tablet Take 1 tablet (20 mg total) by mouth daily with dinner 90 tablet 1   metoprolol succinate  (TOPROL-XL) 50 MG 24 hr tablet Take 50 mg by mouth daily. Take with or immediately following a meal.     metoprolol succinate (TOPROL-XL) 50 MG 24 hr tablet TAKE 1 TABLET BY MOUTH ONCE DAILY 90 tablet 3  metoprolol succinate (TOPROL-XL) 50 MG 24 hr tablet Take 1 tablet (50 mg total) by mouth once daily 90 tablet 3   montelukast (SINGULAIR) 10 MG tablet Take 10 mg by mouth at bedtime.     montelukast (SINGULAIR) 10 MG tablet TAKE 1 TABLET (10 MG TOTAL) BY MOUTH NIGHTLY 90 tablet 1   montelukast (SINGULAIR) 10 MG tablet TAKE 1 TABLET BY MOUTH NIGHTLY 90 tablet 1   montelukast (SINGULAIR) 10 MG tablet TAKE 1 TABLET BY MOUTH NIGHTLY 90 tablet 1   Multiple Vitamin (MULTI-VITAMINS) TABS Take by mouth.     nitrofurantoin, macrocrystal-monohydrate, (MACROBID) 100 MG capsule TAKE 1 CAPSULE BY MOUTH TWICE DAILY AS NEEDED. TAKE ONE AFTER INTERCOURSE 90 capsule 0   No current facility-administered medications on file prior to visit.    Allergies  Allergen Reactions   Amoxicillin    Bextra [Valdecoxib]    Ciprofloxacin    Floxin [Ofloxacin]    Gyne-Lotrimin [Clotrimazole]    Levofloxacin    Quinolones Other (See Comments)   Septra [Sulfamethoxazole-Trimethoprim] Other (See Comments)    Flushed and fever.   Sulfa Antibiotics Other (See Comments)    fever   Penicillins Rash     Review of Systems Constitutional: negative for chills, fatigue, fevers and sweats Eyes: negative for irritation, redness and visual disturbance Ears, nose, mouth, throat, and face: negative for hearing loss, nasal congestion, snoring and tinnitus Respiratory: negative for asthma, cough, sputum Cardiovascular: negative for chest pain, dyspnea, exertional chest pressure/discomfort, irregular heart beat, palpitations and syncope Gastrointestinal: negative for abdominal pain, change in bowel habits, nausea and vomiting Genitourinary: negative for abnormal menstrual periods, genital lesions, sexual problems and vaginal  discharge, dysuria and urinary incontinence Integument/breast: negative for breast lump, breast tenderness and nipple discharge Hematologic/lymphatic: negative for bleeding and easy bruising Musculoskeletal:negative for back pain and muscle weakness Neurological: negative for dizziness, headaches, vertigo and weakness Endocrine: negative for diabetic symptoms including polydipsia, polyuria and skin dryness Allergic/Immunologic: negative for hay fever and urticaria      Objective:  Blood pressure 123/80, pulse 89, resp. rate 16, height 5\' 2"  (1.575 m), weight 128 lb 3.2 oz (58.2 kg), last menstrual period 07/01/2014.  Body mass index is 23.45 kg/m.   General Appearance:    Alert, cooperative, no distress, appears stated age  Head:    Normocephalic, without obvious abnormality, atraumatic  Eyes:    PERRL, conjunctiva/corneas clear, EOM's intact, both eyes  Ears:    Normal external ear canals, both ears  Nose:   Nares normal, septum midline, mucosa normal, no drainage or sinus tenderness  Throat:   Lips, mucosa, and tongue normal; teeth and gums normal  Neck:   Supple, symmetrical, trachea midline, no adenopathy; thyroid: no enlargement/tenderness/nodules; no carotid bruit or JVD  Back:     Symmetric, no curvature, ROM normal, no CVA tenderness  Lungs:     Clear to auscultation bilaterally, respirations unlabored  Chest Wall:    No tenderness or deformity   Heart:    Regular rate and rhythm, S1 and S2 normal, no murmur, rub or gallop  Breast Exam:    No tenderness, masses, or nipple abnormality  Abdomen:     Soft, non-tender, bowel sounds active all four quadrants, no masses, no organomegaly.    Genitalia:    Pelvic:external genitalia normal, vagina without lesions, discharge, or tenderness, rectovaginal septum normal. Grade 1-2 cystocele, Grade 1 rectocele. Cervix normal in appearance, no cervical motion tenderness, no adnexal masses or tenderness.  Uterus normal size, shape,  mobile, regular  contours, nontender.  Rectal:    Normal external sphincter.  No hemorrhoids appreciated. Internal exam not done.   Extremities:   Extremities normal, atraumatic, no cyanosis or edema  Pulses:   2+ and symmetric all extremities  Skin:   Skin color, texture, turgor normal, no rashes or lesions  Lymph nodes:   Cervical, supraclavicular, and axillary nodes normal  Neurologic:   CNII-XII intact, normal strength, sensation and reflexes throughout   .  Labs:  Labs reviewed in Care Everywhere  Assessment:   1. Encounter for well woman exam with routine gynecological exam   2. Breast cancer screening by mammogram   3. Family history of cancer   4. Screening for malignant neoplasm of cervix   5. Hypothyroidism, unspecified type   6. Prediabetes      Plan:  - Blood tests: [place] labs with PCP.  - Breast self exam technique reviewed and patient encouraged to perform self-exam monthly. - Contraception: post menopausal status. - Discussed healthy lifestyle modifications. - Mammogram: Up to date currently. Due in 1 month, will order.  - Pap smear  up to date . - COVID vaccination status: has completed vaccination series.  - Flu Vaccine: declined. Will receive at work - Prediabetes and hypothyroidism managed by PCP.  - Family history of cancer (breast and colon). Continue routine screening. Family members with cancers in advanced age. Can consider hereditary screening if desired, or if additional family members are affected.  Follow up in 1 year for annual exam   Rubie Maid, MD [facility]

## 2021-05-21 ENCOUNTER — Other Ambulatory Visit: Payer: Self-pay

## 2021-05-21 MED ORDER — ALBUTEROL SULFATE HFA 108 (90 BASE) MCG/ACT IN AERS
INHALATION_SPRAY | RESPIRATORY_TRACT | 1 refills | Status: DC
  Filled 2021-05-21: qty 18, 30d supply, fill #0
  Filled 2022-04-21: qty 18, 30d supply, fill #1

## 2021-05-21 MED ORDER — FLUTICASONE PROPIONATE 50 MCG/ACT NA SUSP
NASAL | 1 refills | Status: DC
  Filled 2021-05-21: qty 48, 90d supply, fill #0
  Filled 2022-01-12: qty 48, 90d supply, fill #1

## 2021-05-27 DIAGNOSIS — C44719 Basal cell carcinoma of skin of left lower limb, including hip: Secondary | ICD-10-CM | POA: Diagnosis not present

## 2021-05-27 DIAGNOSIS — C44712 Basal cell carcinoma of skin of right lower limb, including hip: Secondary | ICD-10-CM | POA: Diagnosis not present

## 2021-06-18 ENCOUNTER — Other Ambulatory Visit: Payer: Self-pay

## 2021-06-22 ENCOUNTER — Other Ambulatory Visit: Payer: Self-pay

## 2021-06-22 ENCOUNTER — Ambulatory Visit
Admission: RE | Admit: 2021-06-22 | Discharge: 2021-06-22 | Disposition: A | Discharge status: Home / Self Care | Payer: 59 | Source: Ambulatory Visit | Attending: Obstetrics and Gynecology | Admitting: Obstetrics and Gynecology

## 2021-06-22 DIAGNOSIS — Z1231 Encounter for screening mammogram for malignant neoplasm of breast: Secondary | ICD-10-CM | POA: Insufficient documentation

## 2021-06-23 NOTE — Progress Notes (Signed)
06/24/2021 1:40 PM   [name] [birth year] [id number]  Referring provider: Dion Body, MD [facility] [place],  [place] [address]  Chief Complaint  Patient presents with   Recurrent UTI   Urinary Incontinence   Follow-up    Urological history: 1. rUTI's -contributing factors of vaginal atrophy, age, incontinence, consumption of surgery drinks, constipation, diabetes and intercourse -documented positive urine cultures over the last year  None -taking cranberry tablets  2. SUI -contributing factors of vaginal atrophy, age, cystocele, pelvic floor laxity and diabetes  3. Cystocele -grade 1-2 cystocele   HPI: [name] is a 60 y.o. female who presents today for her yearly visit.  She continues to have two UTI's yearly.  These infections are heralded by LBP and malodorous urine.  Patient denies any modifying or aggravating factors.  Patient denies any gross hematuria, dysuria or suprapubic/flank pain.  Patient denies any fevers, chills, nausea or vomiting.    PMH: Past Medical History:  Diagnosis Date   Allergy    Arthritis    osteoarthritis - fingers   Asthma    Chicken pox    Chronic UTI    Cystocele    2nd degree   Endometriosis    Family history of polyps in the colon    Graves disease    History of heavy periods    Hyperlipemia    IBS (irritable bowel syndrome)    Perimenopausal    PONV (postoperative nausea and vomiting)    nausea only   Rectocele    mild   S/P endometrial ablation    Scoliosis    Skin cancer    SUI (stress urinary incontinence, female)    SVT (supraventricular tachycardia) ([place])    Urine incontinence    Wears contact lenses     Surgical History: Past Surgical History:  Procedure Laterality Date   COLONOSCOPY WITH PROPOFOL N/A 07/10/2018   Procedure: COLONOSCOPY WITH PROPOFOL;  Surgeon: Manya Silvas, MD;  Location: [facility] ENDOSCOPY;  Service: Endoscopy;  Laterality: N/A;    endometrrial ablation     EYE SURGERY     knee athroscopy Left 2013   laprascopic surgery     endometreosis   NASOPHARYNGOSCOPY EUSTATION TUBE BALLOON DILATION Bilateral 05/18/2018   Procedure: NASOPHARYNGOSCOPY EUSTATION TUBE BALLOON DILATION OUTFRACTURE BILATERAL INFERIOR TURBINATES;  Surgeon: Margaretha Sheffield, MD;  Location: [facility];  Service: ENT;  Laterality: Bilateral;   NASOPHARYNGOSCOPY EUSTATION TUBE BALLOON DILATION     nova-sure     THYROIDECTOMY, PARTIAL     TONSILLECTOMY AND ADENOIDECTOMY     tubes in ears      Home Medications:  Allergies as of 06/24/2021       Reactions   Amoxicillin    Bextra [valdecoxib]    Ciprofloxacin    Floxin [ofloxacin]    Gyne-lotrimin [clotrimazole]    Levofloxacin    Quinolones Other (See Comments)   Septra [sulfamethoxazole-trimethoprim] Other (See Comments)   Flushed and fever.   Sulfa Antibiotics Other (See Comments)   fever   Penicillins Rash        Medication List        Accurate as of June 24, 2021  1:40 PM. If you have any questions, ask your nurse or doctor.          albuterol 108 (90 Base) MCG/ACT inhaler Commonly known as: VENTOLIN HFA Inhale 2 inhalations into the lungs every 4 (four) hours as needed for Wheezing   cholecalciferol  1000 units tablet Commonly known as: VITAMIN D Take 1,000 Units by mouth daily.   CRANBERRY PO Take by mouth.   EPINEPHrine 0.3 mg/0.3 mL Soaj injection Commonly known as: EPI-PEN INJECT 0.3 MLS (0.3 MG TOTAL) INTO THE MUSCLE ONCE AS NEEDED FOR ANAPHYLAXIS FOR UP TO 1 DOSE   fexofenadine 180 MG tablet Commonly known as: ALLEGRA Allegra Allergy 180 mg tablet  Take 1 tablet every day by oral route.   fluticasone 50 MCG/ACT nasal spray Commonly known as: FLONASE Place 2 sprays into both nostrils once daily as needed   imipramine 25 MG tablet Commonly known as: TOFRANIL Take 2 tablets (50 mg total) by mouth at bedtime   levothyroxine 88 MCG tablet Commonly  known as: SYNTHROID Take 1 tablet (88 mcg total) by mouth once daily   lovastatin 20 MG tablet Commonly known as: MEVACOR Take 1 tablet (20 mg total) by mouth daily with dinner   metoprolol succinate 50 MG 24 hr tablet Commonly known as: TOPROL-XL Take 1 tablet (50 mg total) by mouth once daily   montelukast 10 MG tablet Commonly known as: SINGULAIR TAKE 1 TABLET (10 MG TOTAL) BY MOUTH NIGHTLY   Multi-Vitamins Tabs Take by mouth.   nitrofurantoin (macrocrystal-monohydrate) 100 MG capsule Commonly known as: MACROBID TAKE 1 CAPSULE BY MOUTH TWICE DAILY AS NEEDED. TAKE ONE AFTER INTERCOURSE        Allergies:  Allergies  Allergen Reactions   Amoxicillin    Bextra [Valdecoxib]    Ciprofloxacin    Floxin [Ofloxacin]    Gyne-Lotrimin [Clotrimazole]    Levofloxacin    Quinolones Other (See Comments)   Septra [Sulfamethoxazole-Trimethoprim] Other (See Comments)    Flushed and fever.   Sulfa Antibiotics Other (See Comments)    fever   Penicillins Rash    Family History: Family History  Problem Relation Age of Onset   Cancer Mother    Hyperlipidemia Mother    Heart disease Mother    Hypertension Mother    Breast cancer Mother 67   Kidney failure Mother    Stroke Mother    Hyperlipidemia Father    Heart disease Father    Hypertension Father    Cancer Father        colon   Breast cancer Paternal Aunt 21   Breast cancer Maternal Aunt 85   Kidney cancer Neg Hx    Bladder Cancer Neg Hx    Prostate cancer Neg Hx    Ovarian cancer Neg Hx    Diabetes Neg Hx     Social History:  reports that she has never smoked. She has never used smokeless tobacco. She reports that she does not drink alcohol and does not use drugs.  ROS: For pertinent review of systems please refer to history of present illness  Physical Exam: BP 122/79   Pulse 94   Ht _0  (1.575 m)   Wt 129 lb (58.5 kg)   LMP 07/01/2014 (Exact Date)   BMI 23.59 kg/m   Constitutional:  Well nourished.  Alert and oriented, No acute distress. HEENT: [place] AT, mask in place.  Trachea midline Cardiovascular: No clubbing, cyanosis, or edema. Respiratory: Normal respiratory effort, no increased work of breathing. Neurologic: Grossly intact, no focal deficits, moving all 4 extremities. Psychiatric: Normal mood and affect.    Laboratory Data: Hemoglobin A1C 4.2 - 5.6 % 5.9 High    Average Blood Glucose (Calc) mg/dL [facility] - [facility]  Narrative Performed by  [facility] - [facility] Normal Range:    4.2 - 5.6%  Increased Risk:  5.7 - 6.4%  Diabetes:        >= 6.5%  Glycemic Control for adults with diabetes:  <7%   Specimen Collected: 04/24/21 07:25 Last Resulted: 04/24/21 09:39  Received From: [place]  Result Received: 05/13/21 09:34   Glucose 70 - 110 mg/dL 93   Sodium 136 - 145 mmol/L 141   Potassium 3.6 - 5.1 mmol/L 4.4   Chloride 97 - 109 mmol/L 103   Carbon Dioxide (CO2) 22.0 - 32.0 mmol/L 31.1   Urea Nitrogen (BUN) 7 - 25 mg/dL 14   Creatinine 0.6 - 1.1 mg/dL 0.8   Glomerular Filtration Rate (eGFR), MDRD Estimate >60 mL/min/1.73sq m 74   Calcium 8.7 - 10.3 mg/dL 9.7   AST  8 - 39 U/L 19   ALT  5 - 38 U/L 22   Alk Phos (alkaline Phosphatase) 34 - 104 U/L 68   Albumin 3.5 - 4.8 g/dL 4.3   Bilirubin, Total 0.3 - 1.2 mg/dL 0.6   Protein, Total 6.1 - 7.9 g/dL 6.5   A/G Ratio 1.0 - 5.0 gm/dL 2.0   Resulting Agency  [facility] - LAB  Specimen Collected: 04/24/21 07:25 Last Resulted: 04/24/21 09:42  Received From: [facility]  Result Received: 05/13/21 09:34   WBC (White Blood Cell Count) 4.1 - 10.2 10^3/uL 5.7   RBC (Red Blood Cell Count) 4.04 - 5.48 10^6/uL 4.86   Hemoglobin 12.0 - 15.0 gm/dL 13.8   Hematocrit 35.0 - 47.0 % 41.6   MCV (Mean Corpuscular Volume) 80.0 - 100.0 fl 85.6   MCH (Mean Corpuscular Hemoglobin) 27.0 - 31.2 pg 28.4   MCHC (Mean Corpuscular Hemoglobin Concentration) 32.0 - 36.0  gm/dL 33.2   Platelet Count 150 - 450 10^3/uL 277   RDW-CV (Red Cell Distribution Width) 11.6 - 14.8 % 12.9   MPV (Mean Platelet Volume) 9.4 - 12.4 fl 9.7   Neutrophils 1.50 - 7.80 10^3/uL 3.10   Lymphocytes 1.00 - 3.60 10^3/uL 2.01   Monocytes 0.00 - 1.50 10^3/uL 0.41   Eosinophils 0.00 - 0.55 10^3/uL 0.09   Basophils 0.00 - 0.09 10^3/uL 0.03   Neutrophil % 32.0 - 70.0 % 54.8   Lymphocyte % 10.0 - 50.0 % 35.6   Monocyte % 4.0 - 13.0 % 7.3   Eosinophil % 1.0 - 5.0 % 1.6   Basophil% 0.0 - 2.0 % 0.5   Immature Granulocyte % <=0.7 % 0.2   Immature Granulocyte Count <=0.06 10^3/L 0.01   Resulting Agency  [facility] - [facility]  Specimen Collected: 04/24/21 07:25 Last Resulted: 04/24/21 08:08  Received From: [facility]  Result Received: 05/13/21 09:34  I have reviewed the labs.    Pertinent Imaging: N/A  Assessment & Plan:    1. rUTI's -she is having two UTI's yearly -continue cranberry juice/tablets -using Macrobid 100 mg as prophylactic -refill given for Macrobid   2. Vaginal atrophy Cannot use vaginal estrogen cream due to endometrial bleeding  3. Cystocele Not bothersome                                  Return in about 1 year (around 06/24/2022) for symptoms recheck .  These notes generated with voice recognition software. I apologize for typographical errors.  Royden Purl  [place]  [address] [place],  [address] [phone]

## 2021-06-24 ENCOUNTER — Encounter: Payer: Self-pay | Admitting: Urology

## 2021-06-24 ENCOUNTER — Other Ambulatory Visit: Payer: Self-pay

## 2021-06-24 ENCOUNTER — Ambulatory Visit (INDEPENDENT_AMBULATORY_CARE_PROVIDER_SITE_OTHER): Payer: 59 | Admitting: Urology

## 2021-06-24 VITALS — BP 122/79 | HR 94 | Ht 62.0 in | Wt 129.0 lb

## 2021-06-24 DIAGNOSIS — N8111 Cystocele, midline: Secondary | ICD-10-CM | POA: Diagnosis not present

## 2021-06-24 DIAGNOSIS — N390 Urinary tract infection, site not specified: Secondary | ICD-10-CM | POA: Diagnosis not present

## 2021-06-24 DIAGNOSIS — N952 Postmenopausal atrophic vaginitis: Secondary | ICD-10-CM | POA: Diagnosis not present

## 2021-06-24 DIAGNOSIS — N393 Stress incontinence (female) (male): Secondary | ICD-10-CM | POA: Diagnosis not present

## 2021-06-24 MED ORDER — NITROFURANTOIN MONOHYD MACRO 100 MG PO CAPS
ORAL_CAPSULE | ORAL | 0 refills | Status: DC
  Filled 2021-06-24: qty 90, 30d supply, fill #0

## 2021-06-25 ENCOUNTER — Other Ambulatory Visit: Payer: Self-pay

## 2021-06-25 MED ORDER — IMIPRAMINE HCL 25 MG PO TABS
ORAL_TABLET | ORAL | 1 refills | Status: DC
  Filled 2021-06-25: qty 180, 90d supply, fill #0
  Filled 2021-09-20: qty 180, 90d supply, fill #1

## 2021-07-08 DIAGNOSIS — M7061 Trochanteric bursitis, right hip: Secondary | ICD-10-CM | POA: Diagnosis not present

## 2021-07-08 DIAGNOSIS — M159 Polyosteoarthritis, unspecified: Secondary | ICD-10-CM | POA: Diagnosis not present

## 2021-07-20 ENCOUNTER — Other Ambulatory Visit: Payer: Self-pay

## 2021-07-28 ENCOUNTER — Other Ambulatory Visit: Payer: Self-pay

## 2021-07-28 MED ORDER — MONTELUKAST SODIUM 10 MG PO TABS
10.0000 mg | ORAL_TABLET | Freq: Every day | ORAL | 1 refills | Status: DC
  Filled 2021-07-28: qty 90, 90d supply, fill #0
  Filled 2021-10-25: qty 90, 90d supply, fill #1

## 2021-08-03 ENCOUNTER — Other Ambulatory Visit: Payer: Self-pay

## 2021-08-03 DIAGNOSIS — H35371 Puckering of macula, right eye: Secondary | ICD-10-CM | POA: Diagnosis not present

## 2021-08-31 ENCOUNTER — Other Ambulatory Visit: Payer: Self-pay

## 2021-08-31 ENCOUNTER — Other Ambulatory Visit: Payer: 59

## 2021-08-31 DIAGNOSIS — Z87440 Personal history of urinary (tract) infections: Secondary | ICD-10-CM

## 2021-08-31 DIAGNOSIS — N390 Urinary tract infection, site not specified: Secondary | ICD-10-CM

## 2021-08-31 NOTE — Telephone Encounter (Signed)
Patient will bring sample to [place] office, orders in.

## 2021-08-31 NOTE — Addendum Note (Signed)
Addended by: Despina Hidden on: 08/31/2021 11:16 AM   Modules accepted: Orders

## 2021-09-02 LAB — URINALYSIS, COMPLETE
Bilirubin, UA: NEGATIVE
Glucose, UA: NEGATIVE
Ketones, UA: NEGATIVE
Nitrite, UA: NEGATIVE
Protein,UA: NEGATIVE
RBC, UA: NEGATIVE
Specific Gravity, UA: 1.01 (ref 1.005–1.030)
Urobilinogen, Ur: 0.2 mg/dL (ref 0.2–1.0)
pH, UA: 7 (ref 5.0–7.5)

## 2021-09-02 LAB — MICROSCOPIC EXAMINATION

## 2021-09-04 ENCOUNTER — Other Ambulatory Visit: Payer: Self-pay | Admitting: Urology

## 2021-09-04 ENCOUNTER — Other Ambulatory Visit: Payer: Self-pay

## 2021-09-04 DIAGNOSIS — N390 Urinary tract infection, site not specified: Secondary | ICD-10-CM

## 2021-09-04 LAB — CULTURE, URINE COMPREHENSIVE

## 2021-09-04 MED ORDER — NITROFURANTOIN MONOHYD MACRO 100 MG PO CAPS
100.0000 mg | ORAL_CAPSULE | Freq: Two times a day (BID) | ORAL | 0 refills | Status: DC
  Filled 2021-09-04: qty 14, 7d supply, fill #0

## 2021-09-14 DIAGNOSIS — H35371 Puckering of macula, right eye: Secondary | ICD-10-CM | POA: Diagnosis not present

## 2021-09-14 DIAGNOSIS — H2511 Age-related nuclear cataract, right eye: Secondary | ICD-10-CM | POA: Diagnosis not present

## 2021-09-16 ENCOUNTER — Other Ambulatory Visit: Payer: Self-pay

## 2021-09-21 ENCOUNTER — Other Ambulatory Visit: Payer: Self-pay

## 2021-10-15 ENCOUNTER — Other Ambulatory Visit: Payer: Self-pay

## 2021-10-15 DIAGNOSIS — H2511 Age-related nuclear cataract, right eye: Secondary | ICD-10-CM | POA: Diagnosis not present

## 2021-10-15 DIAGNOSIS — H52221 Regular astigmatism, right eye: Secondary | ICD-10-CM | POA: Diagnosis not present

## 2021-10-15 MED ORDER — ILEVRO 0.3 % OP SUSP
OPHTHALMIC | 0 refills | Status: DC
  Filled 2021-10-15: qty 3, 30d supply, fill #0

## 2021-10-15 MED ORDER — PREDNISOLONE ACETATE 1 % OP SUSP
OPHTHALMIC | 0 refills | Status: DC
  Filled 2021-10-15: qty 5, 20d supply, fill #0

## 2021-10-15 MED ORDER — LOVASTATIN 20 MG PO TABS
ORAL_TABLET | ORAL | 1 refills | Status: DC
  Filled 2021-10-15: qty 90, 90d supply, fill #0
  Filled 2022-01-11: qty 90, 90d supply, fill #1

## 2021-10-15 MED FILL — Epinephrine Solution Auto-injector 0.3 MG/0.3ML (1:1000): INTRAMUSCULAR | 15 days supply | Qty: 2 | Fill #0 | Status: AC

## 2021-10-16 ENCOUNTER — Other Ambulatory Visit: Payer: Self-pay

## 2021-10-19 ENCOUNTER — Other Ambulatory Visit: Payer: Self-pay

## 2021-10-19 MED ORDER — KETOROLAC TROMETHAMINE 0.5 % OP SOLN
OPHTHALMIC | 0 refills | Status: DC
  Filled 2021-10-19: qty 5, 18d supply, fill #0

## 2021-10-22 ENCOUNTER — Other Ambulatory Visit: Payer: Self-pay

## 2021-10-22 ENCOUNTER — Ambulatory Visit
Admission: RE | Admit: 2021-10-22 | Discharge: 2021-10-22 | Disposition: A | Discharge status: Home / Self Care | Payer: 59 | Source: Ambulatory Visit | Attending: Student | Admitting: Student

## 2021-10-22 VITALS — BP 131/87 | HR 90 | Temp 98.4°F | Resp 18

## 2021-10-22 DIAGNOSIS — H9222 Otorrhagia, left ear: Secondary | ICD-10-CM | POA: Diagnosis not present

## 2021-10-22 MED ORDER — NEOMYCIN-POLYMYXIN-HC 3.5-10000-1 OT SUSP
4.0000 [drp] | Freq: Three times a day (TID) | OTIC | 0 refills | Status: AC
  Filled 2021-10-22: qty 10, 17d supply, fill #0

## 2021-10-22 NOTE — ED Triage Notes (Signed)
Pt here with left ear bleeding from inside canal since this morning.  ?

## 2021-10-22 NOTE — ED Provider Notes (Signed)
[name]    CSN: [id number] Arrival date & time: 10/22/21  1102      History   Chief Complaint Chief Complaint  Patient presents with   Ear Drainage    HPI [name] is a 61 y.o. female presenting with left ear external canal pain, and some bloody discharge.  History of tympanostomy tubes, tympanoplasty, recurrent ear infections.  Denies trauma to the ear, denies Q-tips or recent flights.  Denies recent illness.  Does note untreated allergic rhinitis.  States she woke up with some bloody discharge from the ear.  Placed a cotton ball due to discharge and discomfort.  Denies hearing changes, dizziness, tinnitus.  Denies right ear issue.  HPI  Past Medical History:  Diagnosis Date   Allergy    Arthritis    osteoarthritis - fingers   Asthma    Chicken pox    Chronic UTI    Cystocele    2nd degree   Endometriosis    Family history of polyps in the colon    Graves disease    History of heavy periods    Hyperlipemia    IBS (irritable bowel syndrome)    Perimenopausal    PONV (postoperative nausea and vomiting)    nausea only   Rectocele    mild   S/P endometrial ablation    Scoliosis    Skin cancer    SUI (stress urinary incontinence, female)    SVT (supraventricular tachycardia) ([place])    Urine incontinence    Wears contact lenses     Patient Active Problem List   Diagnosis Date Noted   Digital mucous cyst 07/12/2018   History of Graves' disease 06/14/2018   Pure hypercholesterolemia 06/14/2018   Rhinitis 06/14/2018   Rosacea 06/14/2018   Hx of adenomatous colonic polyps 05/09/2018   Midline cystocele 04/19/2018   Chest pain with low risk for cardiac etiology 10/26/2017   Borderline diabetes mellitus 11/05/2015   Chronic UTI 03/21/2015   SVT (supraventricular tachycardia) ([place]) 03/21/2015   Endometriosis 03/21/2015   Asthma 03/21/2015   Graves disease 03/21/2015   Irritable bowel syndrome 03/21/2015   Family history of breast cancer  03/21/2015   Stool incontinence 03/21/2015   Gastrocnemius tear 01/18/2014   Rupture of patellar tendon 01/18/2014   Bladder infection, chronic 05/24/2012   Incomplete emptying of bladder 05/24/2012   Mixed urge and stress incontinence 05/24/2012    Past Surgical History:  Procedure Laterality Date   COLONOSCOPY WITH PROPOFOL N/A 07/10/2018   Procedure: COLONOSCOPY WITH PROPOFOL;  Surgeon: Manya Silvas, MD;  Location: [facility] ENDOSCOPY;  Service: Endoscopy;  Laterality: N/A;   endometrrial ablation     EYE SURGERY     knee athroscopy Left 2013   laprascopic surgery     endometreosis   NASOPHARYNGOSCOPY EUSTATION TUBE BALLOON DILATION Bilateral 05/18/2018   Procedure: NASOPHARYNGOSCOPY EUSTATION TUBE BALLOON DILATION OUTFRACTURE BILATERAL INFERIOR TURBINATES;  Surgeon: Margaretha Sheffield, MD;  Location: [facility];  Service: ENT;  Laterality: Bilateral;   NASOPHARYNGOSCOPY EUSTATION TUBE BALLOON DILATION     nova-sure     THYROIDECTOMY, PARTIAL     TONSILLECTOMY AND ADENOIDECTOMY     tubes in ears      OB History     Gravida  2   Para  2   Term  2   Preterm      AB      Living  2      SAB      IAB  Ectopic      Multiple      Live Births  2            Home Medications    Prior to Admission medications   Medication Sig Start Date End Date Taking? Authorizing Provider  neomycin-polymyxin-hydrocortisone (CORTISPORIN) 3.5-10000-1 OTIC suspension Place 4 drops into the left ear 3 (three) times daily for 7 days. 10/22/21 11/08/21 Yes Hazel Sams, PA-C  ketorolac (ACULAR) 0.5 % ophthalmic solution Place one drop in the affected eye 4 times a day starting after cataract surgery 10/19/21     albuterol (VENTOLIN HFA) 108 (90 Base) MCG/ACT inhaler Inhale 2 inhalations into the lungs every 4 (four) hours as needed for Wheezing 05/21/21     cholecalciferol (VITAMIN D) 1000 UNITS tablet Take 1,000 Units by mouth daily.    [provider]  CRANBERRY  PO Take by mouth.    [provider]  EPINEPHrine 0.3 mg/0.3 mL IJ SOAJ injection INJECT 0.3 MLS (0.3 MG TOTAL) INTO THE MUSCLE ONCE AS NEEDED FOR ANAPHYLAXIS FOR UP TO 1 DOSE 10/22/20 10/31/21  Dion Body, MD  fexofenadine (ALLEGRA) 180 MG tablet Allegra Allergy 180 mg tablet  Take 1 tablet every day by oral route.    [provider]  fluticasone (FLONASE) 50 MCG/ACT nasal spray Place 2 sprays into both nostrils once daily as needed 05/21/21     ILEVRO 0.3 % ophthalmic suspension instill 1 gtt q d starting 2 DAYS  PRIOR TO SURGERY AND CONTINUE QD TILL BOTTLE IS FINISHED 10/15/21     imipramine (TOFRANIL) 25 MG tablet Take 2 tablets (50 mg total) by mouth at bedtime 03/31/21     imipramine (TOFRANIL) 25 MG tablet Take 2 tablets (50 mg total) by mouth at bedtime 06/25/21     levothyroxine (SYNTHROID) 88 MCG tablet Take 1 tablet (88 mcg total) by mouth once daily 04/29/21     lovastatin (MEVACOR) 20 MG tablet Take 1 tablet (20 mg total) by mouth daily with dinner 10/15/21     metoprolol succinate (TOPROL-XL) 50 MG 24 hr tablet Take 1 tablet (50 mg total) by mouth once daily 12/22/20     montelukast (SINGULAIR) 10 MG tablet Take 1 tablet (10 mg total) by mouth at bedtime 07/28/21     Multiple Vitamin (MULTI-VITAMINS) TABS Take by mouth.    [provider]  nitrofurantoin, macrocrystal-monohydrate, (MACROBID) 100 MG capsule Take 1 capsule (100 mg total) by mouth every 12 (twelve) hours. 09/04/21   McGowan, Larene [place] A, PA-C  prednisoLONE acetate (PRED FORTE) 1 % ophthalmic suspension INSTILL 1 DROP IN THE RIGHT EYE 4 TIMESA DAY STARTING AFTER SURGERY 10/15/21       Family History Family History  Problem Relation Age of Onset   Cancer Mother    Hyperlipidemia Mother    Heart disease Mother    Hypertension Mother    Breast cancer Mother 57   Kidney failure Mother    Stroke Mother    Hyperlipidemia Father    Heart disease Father    Hypertension Father    Cancer Father         colon   Breast cancer Paternal Aunt 43   Breast cancer Maternal Aunt 85   Kidney cancer Neg Hx    Bladder Cancer Neg Hx    Prostate cancer Neg Hx    Ovarian cancer Neg Hx    Diabetes Neg Hx     Social History Social History   Tobacco Use   Smoking  status: Never   Smokeless tobacco: Never  Vaping Use   Vaping Use: Never used  Substance Use Topics   Alcohol use: No    Alcohol/week: 0.0 standard drinks   Drug use: No     Allergies   Amoxicillin, Bextra [valdecoxib], Ciprofloxacin, Floxin [ofloxacin], Gyne-lotrimin [clotrimazole], Levofloxacin, Quinolones, Septra [sulfamethoxazole-trimethoprim], Sulfa antibiotics, and Penicillins   Review of Systems Review of Systems  HENT:  Positive for ear discharge.   All other systems reviewed and are negative.   Physical Exam Triage Vital Signs ED Triage Vitals  Enc Vitals Group     BP 10/22/21 1114 131/87     Pulse Rate 10/22/21 1114 90     Resp 10/22/21 1114 18     Temp 10/22/21 1114 98.4 F (36.9 C)     Temp src --      SpO2 10/22/21 1114 99 %     Weight --      Height --      Head Circumference --      Peak Flow --      Pain Score 10/22/21 1105 0     Pain Loc --      Pain Edu? --      Excl. in [place]? --    No data found.  Updated Vital Signs BP 131/87    Pulse 90    Temp 98.4 F (36.9 C)    Resp 18    LMP 07/01/2014 (Exact Date)    SpO2 99%   Visual Acuity Right Eye Distance:   Left Eye Distance:   Bilateral Distance:    Right Eye Near:   Left Eye Near:    Bilateral Near:     Physical Exam Vitals reviewed.  Constitutional:      Appearance: Normal appearance. She is not ill-appearing.  HENT:     Head: Normocephalic and atraumatic.     Right Ear: Hearing normal. No foreign body (hearing aid in place).     Left Ear: Hearing, tympanic membrane, ear canal and external ear normal. Tenderness present. No swelling.  No middle ear effusion. There is no impacted cerumen. No mastoid tenderness. Tympanic membrane is  not injected, scarred, perforated, erythematous, retracted or bulging.     Ears:     Comments: There is a small area of irritation and bleeding L external canal. TM appears healthy and intact, without perforation.    Mouth/Throat:     Pharynx: Oropharynx is clear. No oropharyngeal exudate or posterior oropharyngeal erythema.  Cardiovascular:     Rate and Rhythm: Normal rate and regular rhythm.     Heart sounds: Normal heart sounds.  Pulmonary:     Effort: Pulmonary effort is normal.     Breath sounds: Normal breath sounds.  Lymphadenopathy:     Cervical: No cervical adenopathy.  Neurological:     General: No focal deficit present.     Mental Status: She is alert and oriented to person, place, and time.  Psychiatric:        Mood and Affect: Mood normal.        Behavior: Behavior normal.        Thought Content: Thought content normal.        Judgment: Judgment normal.     UC Treatments / Results  Labs (all labs ordered are listed, but only abnormal results are displayed) Labs Reviewed - No data to display  EKG   Radiology No results found.  Procedures Procedures (including critical care time)  Medications Ordered in  UC Medications - No data to display  Initial Impression / Assessment and Plan / UC Course  I have reviewed the triage vital signs and the nursing notes.  Pertinent labs & imaging results that were available during my care of the patient were reviewed by me and considered in my medical decision making (see chart for details).     This patient is a very pleasant 61 y.o. year old female presenting with L external canal irritation and bleeding. Afebrile, nontachy. Denies trauma or recent q-tip use. The TM appears healthy and intact. Will manage with abx drops to prevent infection. Multiple antibiotic allergies including fluoroquinolones; will manage with cortisporin which she has tolerated in the past. Clean dry ear precautions..   Final Clinical Impressions(s)  / UC Diagnoses   Final diagnoses:  Ear discharge blood, left     Discharge Instructions      -Cortisporin drops L ear 3x daily x7 days -Keep the ear dry - use a cotton ball or earplug in the shower -Follow-up if symptoms worsen/persist    ED Prescriptions     Medication Sig Dispense Auth. Provider   neomycin-polymyxin-hydrocortisone (CORTISPORIN) 3.5-10000-1 OTIC suspension Place 4 drops into the left ear 3 (three) times daily for 7 days. 10 mL Hazel Sams, PA-C      PDMP not reviewed this encounter.   Hazel Sams, PA-C 10/22/21 1150

## 2021-10-22 NOTE — Discharge Instructions (Addendum)
-  Cortisporin drops L ear 3x daily x7 days ?-Keep the ear dry - use a cotton ball or earplug in the shower ?-Follow-up if symptoms worsen/persist  ?

## 2021-10-26 ENCOUNTER — Other Ambulatory Visit: Payer: Self-pay

## 2021-10-27 ENCOUNTER — Encounter: Payer: Self-pay | Admitting: Ophthalmology

## 2021-10-30 DIAGNOSIS — R7303 Prediabetes: Secondary | ICD-10-CM | POA: Diagnosis not present

## 2021-10-30 DIAGNOSIS — Z8639 Personal history of other endocrine, nutritional and metabolic disease: Secondary | ICD-10-CM | POA: Diagnosis not present

## 2021-10-30 DIAGNOSIS — E7800 Pure hypercholesterolemia, unspecified: Secondary | ICD-10-CM | POA: Diagnosis not present

## 2021-11-03 ENCOUNTER — Ambulatory Visit: Payer: 59 | Admitting: Anesthesiology

## 2021-11-03 ENCOUNTER — Encounter
Admission: RE | Disposition: A | Discharge status: Home / Self Care | Payer: Self-pay | Source: Home / Self Care | Attending: Ophthalmology

## 2021-11-03 ENCOUNTER — Other Ambulatory Visit: Payer: Self-pay

## 2021-11-03 ENCOUNTER — Encounter: Payer: Self-pay | Admitting: Ophthalmology

## 2021-11-03 ENCOUNTER — Ambulatory Visit
Admission: RE | Admit: 2021-11-03 | Discharge: 2021-11-03 | Disposition: A | Discharge status: Home / Self Care | Payer: 59 | Attending: Ophthalmology | Admitting: Ophthalmology

## 2021-11-03 DIAGNOSIS — H2511 Age-related nuclear cataract, right eye: Secondary | ICD-10-CM | POA: Diagnosis not present

## 2021-11-03 DIAGNOSIS — J45909 Unspecified asthma, uncomplicated: Secondary | ICD-10-CM | POA: Insufficient documentation

## 2021-11-03 DIAGNOSIS — H25811 Combined forms of age-related cataract, right eye: Secondary | ICD-10-CM | POA: Diagnosis not present

## 2021-11-03 HISTORY — DX: Unspecified perforation of tympanic membrane, right ear: H72.91

## 2021-11-03 HISTORY — DX: Presence of external hearing-aid: Z97.4

## 2021-11-03 HISTORY — PX: CATARACT EXTRACTION W/PHACO: SHX586

## 2021-11-03 SURGERY — PHACOEMULSIFICATION, CATARACT, WITH IOL INSERTION
Anesthesia: Monitor Anesthesia Care | Site: Eye | Laterality: Right

## 2021-11-03 MED ORDER — SIGHTPATH DOSE#1 BSS IO SOLN
INTRAOCULAR | Status: DC | PRN
  Administered 2021-11-03: 1 mL

## 2021-11-03 MED ORDER — FENTANYL CITRATE (PF) 100 MCG/2ML IJ SOLN
INTRAMUSCULAR | Status: DC | PRN
  Administered 2021-11-03: 50 ug via INTRAVENOUS

## 2021-11-03 MED ORDER — ACETAMINOPHEN 160 MG/5ML PO SOLN: 325.0000 mg | Freq: Once | ORAL | Status: DC

## 2021-11-03 MED ORDER — TETRACAINE HCL 0.5 % OP SOLN
1.0000 [drp] | OPHTHALMIC | Status: DC | PRN
  Administered 2021-11-03 (×3): 1 [drp] via OPHTHALMIC

## 2021-11-03 MED ORDER — ARMC OPHTHALMIC DILATING DROPS
1.0000 "application " | OPHTHALMIC | Status: DC | PRN
  Administered 2021-11-03 (×3): 1 via OPHTHALMIC

## 2021-11-03 MED ORDER — SIGHTPATH DOSE#1 BSS IO SOLN
INTRAOCULAR | Status: DC | PRN
  Administered 2021-11-03: 46 mL via OPHTHALMIC

## 2021-11-03 MED ORDER — SIGHTPATH DOSE#1 BSS IO SOLN
INTRAOCULAR | Status: DC | PRN
  Administered 2021-11-03: 15 mL

## 2021-11-03 MED ORDER — LACTATED RINGERS IV SOLN: INTRAVENOUS | Status: DC

## 2021-11-03 MED ORDER — BRIMONIDINE TARTRATE-TIMOLOL 0.2-0.5 % OP SOLN
OPHTHALMIC | Status: DC | PRN
  Administered 2021-11-03: 1 [drp] via OPHTHALMIC

## 2021-11-03 MED ORDER — SIGHTPATH DOSE#1 NA CHONDROIT SULF-NA HYALURON 40-17 MG/ML IO SOLN
INTRAOCULAR | Status: DC | PRN
  Administered 2021-11-03: 1 mL via INTRAOCULAR

## 2021-11-03 MED ORDER — ACETAMINOPHEN 325 MG PO TABS: 325.0000 mg | ORAL_TABLET | Freq: Once | ORAL | Status: DC

## 2021-11-03 MED ORDER — MIDAZOLAM HCL 2 MG/2ML IJ SOLN
INTRAMUSCULAR | Status: DC | PRN
Start: 2021-11-03 — End: 2021-11-03

## 2021-11-03 MED ORDER — CEFUROXIME OPHTHALMIC INJECTION 1 MG/0.1 ML
INJECTION | OPHTHALMIC | Status: DC | PRN
  Administered 2021-11-03: 0.1 mL via INTRACAMERAL

## 2021-11-03 MED ORDER — MIDAZOLAM HCL 2 MG/2ML IJ SOLN
INTRAMUSCULAR | Status: DC | PRN
  Administered 2021-11-03: 1 mg via INTRAVENOUS

## 2021-11-03 MED ORDER — POLYMYXIN B-TRIMETHOPRIM 10000-0.1 UNIT/ML-% OP SOLN
OPHTHALMIC | Status: DC | PRN
  Administered 2021-11-03: 1 [drp] via OPHTHALMIC

## 2021-11-03 SURGICAL SUPPLY — 16 items
CANNULA ANT/CHMB 27G (MISCELLANEOUS) IMPLANT
CANNULA ANT/CHMB 27GA (MISCELLANEOUS) IMPLANT
CATARACT SUITE SIGHTPATH (MISCELLANEOUS) ×2 IMPLANT
FEE CATARACT SUITE SIGHTPATH (MISCELLANEOUS) ×1 IMPLANT
GLOVE SURG ENC TEXT LTX SZ8 (GLOVE) ×2 IMPLANT
GLOVE SURG TRIUMPH 8.0 PF LTX (GLOVE) ×2 IMPLANT
LENS IOL TECNIS EYHANCE 13.5 (Intraocular Lens) ×1 IMPLANT
NDL FILTER BLUNT 18X1 1/2 (NEEDLE) ×1 IMPLANT
NEEDLE FILTER BLUNT 18X 1/2SAF (NEEDLE) ×1
NEEDLE FILTER BLUNT 18X1 1/2 (NEEDLE) ×1 IMPLANT
PACK VIT ANT 23G (MISCELLANEOUS) IMPLANT
RING MALYGIN (MISCELLANEOUS) IMPLANT
SUT ETHILON 10-0 CS-B-6CS-B-6 (SUTURE)
SUTURE EHLN 10-0 CS-B-6CS-B-6 (SUTURE) IMPLANT
SYR 3ML LL SCALE MARK (SYRINGE) ×2 IMPLANT
WATER STERILE IRR 250ML POUR (IV SOLUTION) ×2 IMPLANT

## 2021-11-03 NOTE — Discharge Instructions (Signed)

## 2021-11-03 NOTE — Anesthesia Procedure Notes (Signed)
Procedure Name: [place] ?Date/Time: 11/03/2021 10:11 AM ?Performed by: Dionne Bucy, CRNA ?Pre-anesthesia Checklist: Patient identified, Emergency Drugs available, Suction available, Patient being monitored and Timeout performed ?Patient Re-evaluated:Patient Re-evaluated prior to induction ?Oxygen Delivery Method: Nasal cannula ?Placement Confirmation: positive ETCO2 ? ? ? ? ?

## 2021-11-03 NOTE — Op Note (Signed)
PREOPERATIVE DIAGNOSIS:  Nuclear sclerotic cataract of the right eye. ?  ?POSTOPERATIVE DIAGNOSIS:  Cataract ?  ?OPERATIVE PROCEDURE:ORPROCALL@ ?  ?SURGEON:  Birder Robson, MD. ?  ?ANESTHESIA: ? ?Anesthesiologist: Ronelle Nigh, MD ?CRNA: Dionne Bucy, CRNA ? ?1.      Managed anesthesia care. ?2.      0.26m of Shugarcaine was instilled in the eye following the paracentesis. ?  ?COMPLICATIONS:  None. ?  ?TECHNIQUE:   Stop and chop ?  ?DESCRIPTION OF PROCEDURE:  The patient was examined and consented in the preoperative holding area where the aforementioned topical anesthesia was applied to the right eye and then brought back to the Operating Room where the right eye was prepped and draped in the usual sterile ophthalmic fashion and a lid speculum was placed. A paracentesis was created with the side port blade and the anterior chamber was filled with viscoelastic. A near clear corneal incision was performed with the steel keratome. A continuous curvilinear capsulorrhexis was performed with a cystotome followed by the capsulorrhexis forceps. Hydrodissection and hydrodelineation were carried out with BSS on a blunt cannula. The lens was removed in a stop and chop  technique and the remaining cortical material was removed with the irrigation-aspiration handpiece. The capsular bag was inflated with viscoelastic and the Technis [id number]  lens was placed in the capsular bag without complication. The remaining viscoelastic was removed from the eye with the irrigation-aspiration handpiece. The wounds were hydrated. The anterior chamber was flushed with BSS and the eye was inflated to physiologic pressure. 0.147mof Cefuroxime was placed in the anterior chamber. The wounds were found to be water tight. The eye was dressed with Combigan and Polytrim The patient was given protective glasses to wear throughout the day and a shield with which to sleep tonight. The patient was also given drops with which to begin a drop regimen  today and will follow-up with me in one day. ?Implant Name Type Inv. Item Serial No. Manufacturer Lot No. LRB No. Used Action  ?LENS IOL TECNIS EYHANCE 13.5 - [id number] Lens LENS IOL TECNIS EYHANCE 13.5 [id number]  Right 1 Implanted  ? ?Procedure(s) with comments: ?CATARACT EXTRACTION PHACO AND INTRAOCULAR LENS PLACEMENT (IOC) RIGHT (Right) - 2.98 ?0:27.0 ? ?Electronically signed: WiBirder Robson/14/2023 10:30 AM ? ?

## 2021-11-03 NOTE — H&P (Signed)
[place]  ? ?Primary Care Physician:  Dion Body, MD ?Ophthalmologist: Dr. George Ina ? ?Pre-Procedure History & Physical: ?HPI:  [name] is a 61 y.o. female here for cataract surgery. ?  ?Past Medical History:  ?Diagnosis Date  ? Allergy   ? Arthritis   ? osteoarthritis - fingers  ? Asthma   ? Chicken pox   ? Chronic UTI   ? Cystocele   ? 2nd degree  ? Endometriosis   ? Family history of polyps in the colon   ? Graves disease   ? History of heavy periods   ? Hole in the ear drum, right   ? Hyperlipemia   ? IBS (irritable bowel syndrome)   ? Perimenopausal   ? PONV (postoperative nausea and vomiting)   ? nausea only  ? Rectocele   ? mild  ? S/P endometrial ablation   ? Scoliosis   ? Skin cancer   ? SUI (stress urinary incontinence, female)   ? SVT (supraventricular tachycardia) ([employer])   ? Urine incontinence   ? Wears contact lenses   ? Wears hearing aid in both ears   ? ? ?Past Surgical History:  ?Procedure Laterality Date  ? COLONOSCOPY WITH PROPOFOL N/A 07/10/2018  ? Procedure: COLONOSCOPY WITH PROPOFOL;  Surgeon: Manya Silvas, MD;  Location: [facility] ENDOSCOPY;  Service: Endoscopy;  Laterality: N/A;  ? endometrrial ablation    ? EYE SURGERY    ? knee athroscopy Left 2013  ? laprascopic surgery    ? endometreosis  ? NASOPHARYNGOSCOPY EUSTATION TUBE BALLOON DILATION Bilateral 05/18/2018  ? Procedure: NASOPHARYNGOSCOPY EUSTATION TUBE BALLOON DILATION OUTFRACTURE BILATERAL INFERIOR TURBINATES;  Surgeon: Margaretha Sheffield, MD;  Location: [place];  Service: ENT;  Laterality: Bilateral;  ? NASOPHARYNGOSCOPY EUSTATION TUBE BALLOON DILATION    ? nova-sure    ? THYROIDECTOMY, PARTIAL    ? TONSILLECTOMY AND ADENOIDECTOMY    ? tubes in ears    ? ? ?Prior to Admission medications   ?Medication Sig Start Date End Date Taking? Authorizing Provider  ?albuterol (VENTOLIN HFA) 108 (90 Base) MCG/ACT inhaler Inhale 2 inhalations into the lungs every 4 (four) hours as needed for Wheezing 05/21/21  Yes    ?cholecalciferol (VITAMIN D) 1000 UNITS tablet Take 1,000 Units by mouth daily.   Yes [provider]  ?fexofenadine (ALLEGRA) 180 MG tablet Allegra Allergy 180 mg tablet ? Take 1 tablet every day by oral route.   Yes [provider]  ?fluticasone (FLONASE) 50 MCG/ACT nasal spray Place 2 sprays into both nostrils once daily as needed 05/21/21  Yes   ?ILEVRO 0.3 % ophthalmic suspension instill 1 gtt q d starting 2 DAYS  PRIOR TO SURGERY AND CONTINUE QD TILL BOTTLE IS FINISHED 10/15/21  Yes   ?imipramine (TOFRANIL) 25 MG tablet Take 2 tablets (50 mg total) by mouth at bedtime 03/31/21  Yes   ?imipramine (TOFRANIL) 25 MG tablet Take 2 tablets (50 mg total) by mouth at bedtime 06/25/21  Yes   ?ketorolac (ACULAR) 0.5 % ophthalmic solution Place one drop in the affected eye 4 times a day starting after cataract surgery 10/19/21  Yes   ?levothyroxine (SYNTHROID) 88 MCG tablet Take 1 tablet (88 mcg total) by mouth once daily 04/29/21  Yes   ?lovastatin (MEVACOR) 20 MG tablet Take 1 tablet (20 mg total) by mouth daily with dinner 10/15/21  Yes   ?metoprolol succinate (TOPROL-XL) 50 MG 24 hr tablet Take 1 tablet (50 mg total) by mouth once daily  12/22/20  Yes   ?montelukast (SINGULAIR) 10 MG tablet Take 1 tablet (10 mg total) by mouth at bedtime 07/28/21  Yes   ?Multiple Vitamin (MULTI-VITAMINS) TABS Take by mouth.   Yes [provider]  ?neomycin-polymyxin-hydrocortisone (CORTISPORIN) 3.5-10000-1 OTIC suspension Place 4 drops into the left ear 3 (three) times daily for 7 days. 10/22/21 11/08/21 Yes Hazel Sams, PA-C  ?nitrofurantoin, macrocrystal-monohydrate, (MACROBID) 100 MG capsule Take 1 capsule (100 mg total) by mouth every 12 (twelve) hours. 09/04/21  Yes McGowan, Larene Beach A, PA-C  ?prednisoLONE acetate (PRED FORTE) 1 % ophthalmic suspension INSTILL 1 DROP IN THE RIGHT EYE 4 TIMESA DAY STARTING AFTER SURGERY 10/15/21     ? ? ?Allergies as of 09/15/2021 - Review Complete 06/24/2021  ?Allergen Reaction  Noted  ? Amoxicillin  01/18/2014  ? Bextra [valdecoxib]  01/18/2014  ? Ciprofloxacin  01/18/2014  ? Floxin [ofloxacin]  01/18/2014  ? Gyne-lotrimin [clotrimazole]  01/18/2014  ? Levofloxacin    ? Quinolones Other (See Comments) 06/15/2013  ? Septra [sulfamethoxazole-trimethoprim] Other (See Comments) 01/18/2014  ? Sulfa antibiotics Other (See Comments) 12/25/2014  ? Penicillins Rash 12/25/2014  ? ? ?Family History  ?Problem Relation Age of Onset  ? Cancer Mother   ? Hyperlipidemia Mother   ? Heart disease Mother   ? Hypertension Mother   ? Breast cancer Mother 3  ? Kidney failure Mother   ? Stroke Mother   ? Hyperlipidemia Father   ? Heart disease Father   ? Hypertension Father   ? Cancer Father   ?     colon  ? Breast cancer Paternal Aunt 57  ? Breast cancer Maternal Aunt 41  ? Kidney cancer Neg Hx   ? Bladder Cancer Neg Hx   ? Prostate cancer Neg Hx   ? Ovarian cancer Neg Hx   ? Diabetes Neg Hx   ? ? ?Social History  ? ?Socioeconomic History  ? Marital status: Married  ?  Spouse name: Not on file  ? Number of children: Not on file  ? Years of education: Not on file  ? Highest education level: Not on file  ?Occupational History  ? Occupation: [employer]  ?Tobacco Use  ? Smoking status: Never  ? Smokeless tobacco: Never  ?Vaping Use  ? Vaping Use: Never used  ?Substance and Sexual Activity  ? Alcohol use: No  ?  Alcohol/week: 0.0 standard drinks  ? Drug use: No  ? Sexual activity: Yes  ?  Birth control/protection: Post-menopausal  ?Other Topics Concern  ? Not on file  ?Social History Narrative  ? Not on file  ? ?Social Determinants of Health  ? ?Financial Resource Strain: Not on file  ?Food Insecurity: Not on file  ?Transportation Needs: Not on file  ?Physical Activity: Not on file  ?Stress: Not on file  ?Social Connections: Not on file  ?Intimate Partner Violence: Not on file  ? ? ?Review of Systems: ?See HPI, otherwise negative ROS ? ?Physical Exam: ?BP 123/89   Pulse (!) 101   Temp 97.6 ?F (36.4 ?C)  (Other (Comment))   Resp 18   Ht '5\' 2"'$  (1.575 m)   Wt 59.6 kg   LMP 07/01/2014 (Exact Date)   SpO2 96%   BMI 24.05 kg/m?  ?General:   Alert, cooperative in NAD ?Head:  Normocephalic and atraumatic. ?Respiratory:  Normal work of breathing. ?Cardiovascular:  RRR ? ?Impression/Plan: ?Chaney Ingram Lievanos is here for cataract surgery. ? ?Risks, benefits, limitations, and alternatives regarding cataract surgery have  been reviewed with the patient.  Questions have been answered.  All parties agreeable. ? ? ?Birder Robson, MD  11/03/2021, 9:59 AM ? ? ?

## 2021-11-03 NOTE — Anesthesia Postprocedure Evaluation (Signed)
Anesthesia Post Note ? ?Patient: [name] ? ?Procedure(s) Performed: CATARACT EXTRACTION PHACO AND INTRAOCULAR LENS PLACEMENT (IOC) RIGHT (Right: Eye) ? ? ?  ?Patient location during evaluation: PACU ?Anesthesia Type: MAC ?Level of consciousness: awake and alert and oriented ?Pain management: satisfactory to patient ?Vital Signs Assessment: post-procedure vital signs reviewed and stable ?Respiratory status: spontaneous breathing, nonlabored ventilation and respiratory function stable ?Cardiovascular status: blood pressure returned to baseline and stable ?Postop Assessment: Adequate PO intake and No signs of nausea or vomiting ?Anesthetic complications: no ? ? ?No notable events documented. ? ?Raliegh Ip ? ? ? ? ? ?

## 2021-11-03 NOTE — Anesthesia Preprocedure Evaluation (Signed)
Anesthesia Evaluation  ?Patient identified by MRN, date of birth, ID band ?Patient awake ? ? ? ?Reviewed: ?Allergy & Precautions, H&P , NPO status , Patient's Chart, lab work & pertinent test results ? ?Airway ?Mallampati: II ? ?TM Distance: >3 FB ?Neck ROM: full ? ? ? Dental ?no notable dental hx. ? ?  ?Pulmonary ?asthma ,  ?  ?Pulmonary exam normal ?breath sounds clear to auscultation ? ? ? ? ? ? Cardiovascular ?Supra Ventricular Tachycardia  ?Rhythm:regular Rate:Normal ? ? ?  ?Neuro/Psych ?  ? GI/Hepatic ?  ?Endo/Other  ? ? Renal/GU ?  ? ?  ?Musculoskeletal ? ? Abdominal ?  ?Peds ? Hematology ?  ?Anesthesia Other Findings ? ? Reproductive/Obstetrics ? ?  ? ? ? ? ? ? ? ? ? ? ? ? ? ?  ?  ? ? ? ? ? ? ? ? ?Anesthesia Physical ?Anesthesia Plan ? ?ASA: 2 ? ?Anesthesia Plan: MAC  ? ?Post-op Pain Management: Minimal or no pain anticipated  ? ?Induction:  ? ?PONV Risk Score and Plan: 2 and Treatment may vary due to age or medical condition, Midazolam and TIVA ? ?Airway Management Planned:  ? ?Additional Equipment:  ? ?Intra-op Plan:  ? ?Post-operative Plan:  ? ?Informed Consent: I have reviewed the patients History and Physical, chart, labs and discussed the procedure including the risks, benefits and alternatives for the proposed anesthesia with the patient or authorized representative who has indicated his/her understanding and acceptance.  ? ? ? ?Dental Advisory Given ? ?Plan Discussed with: CRNA ? ?Anesthesia Plan Comments:   ? ? ? ? ? ? ?Anesthesia Quick Evaluation ? ?

## 2021-11-03 NOTE — Transfer of Care (Signed)
Immediate Anesthesia Transfer of Care Note ? ?Patient: [name] ? ?Procedure(s) Performed: CATARACT EXTRACTION PHACO AND INTRAOCULAR LENS PLACEMENT (IOC) RIGHT (Right: Eye) ? ?Patient Location: PACU ? ?Anesthesia Type: MAC ? ?Level of Consciousness: awake, alert  and patient cooperative ? ?Airway and Oxygen Therapy: Patient Spontanous Breathing and Patient connected to supplemental oxygen ? ?Post-op Assessment: Post-op Vital signs reviewed, Patient's Cardiovascular Status Stable, Respiratory Function Stable, Patent Airway and No signs of Nausea or vomiting ? ?Post-op Vital Signs: Reviewed and stable ? ?Complications: No notable events documented. ? ?

## 2021-11-04 ENCOUNTER — Other Ambulatory Visit: Payer: Self-pay

## 2021-11-04 ENCOUNTER — Encounter: Payer: Self-pay | Admitting: Ophthalmology

## 2021-11-04 DIAGNOSIS — R7303 Prediabetes: Secondary | ICD-10-CM | POA: Diagnosis not present

## 2021-11-04 DIAGNOSIS — I471 Supraventricular tachycardia: Secondary | ICD-10-CM | POA: Diagnosis not present

## 2021-11-04 DIAGNOSIS — E7800 Pure hypercholesterolemia, unspecified: Secondary | ICD-10-CM | POA: Diagnosis not present

## 2021-11-04 DIAGNOSIS — Z8639 Personal history of other endocrine, nutritional and metabolic disease: Secondary | ICD-10-CM | POA: Diagnosis not present

## 2021-11-04 MED ORDER — LEVOTHYROXINE SODIUM 100 MCG PO TABS
ORAL_TABLET | ORAL | 2 refills | Status: DC
  Filled 2021-11-04: qty 30, 30d supply, fill #0
  Filled 2021-11-30: qty 30, 30d supply, fill #1
  Filled 2021-12-28: qty 30, 30d supply, fill #2

## 2021-11-26 DIAGNOSIS — H2512 Age-related nuclear cataract, left eye: Secondary | ICD-10-CM | POA: Diagnosis not present

## 2021-11-30 ENCOUNTER — Other Ambulatory Visit: Payer: Self-pay

## 2021-12-09 DIAGNOSIS — D2262 Melanocytic nevi of left upper limb, including shoulder: Secondary | ICD-10-CM | POA: Diagnosis not present

## 2021-12-09 DIAGNOSIS — Z85828 Personal history of other malignant neoplasm of skin: Secondary | ICD-10-CM | POA: Diagnosis not present

## 2021-12-09 DIAGNOSIS — L910 Hypertrophic scar: Secondary | ICD-10-CM | POA: Diagnosis not present

## 2021-12-09 DIAGNOSIS — D225 Melanocytic nevi of trunk: Secondary | ICD-10-CM | POA: Diagnosis not present

## 2021-12-09 DIAGNOSIS — D2261 Melanocytic nevi of right upper limb, including shoulder: Secondary | ICD-10-CM | POA: Diagnosis not present

## 2021-12-10 ENCOUNTER — Telehealth: Payer: Self-pay | Admitting: Urology

## 2021-12-10 NOTE — Telephone Encounter (Signed)
Pt called office and said she has another UTI.  She asked if she could drop off a urine tomorrow.  I told her our office didn't do that.  She said she did this last time on 08/31/21.  I didn't know what to tell her, so I said I would send a message back. ?

## 2021-12-10 NOTE — Telephone Encounter (Signed)
Appt scheduled

## 2021-12-11 ENCOUNTER — Ambulatory Visit (INDEPENDENT_AMBULATORY_CARE_PROVIDER_SITE_OTHER): Payer: 59 | Admitting: Physician Assistant

## 2021-12-11 ENCOUNTER — Other Ambulatory Visit: Payer: Self-pay

## 2021-12-11 ENCOUNTER — Encounter: Payer: Self-pay | Admitting: Physician Assistant

## 2021-12-11 VITALS — BP 119/78 | HR 83 | Temp 97.7°F | Ht 62.0 in | Wt 130.0 lb

## 2021-12-11 DIAGNOSIS — N390 Urinary tract infection, site not specified: Secondary | ICD-10-CM

## 2021-12-11 LAB — URINALYSIS, COMPLETE
Bilirubin, UA: NEGATIVE
Glucose, UA: NEGATIVE
Ketones, UA: NEGATIVE
Nitrite, UA: NEGATIVE
Protein,UA: NEGATIVE
RBC, UA: NEGATIVE
Specific Gravity, UA: 1.015 (ref 1.005–1.030)
Urobilinogen, Ur: 0.2 mg/dL (ref 0.2–1.0)
pH, UA: 7 (ref 5.0–7.5)

## 2021-12-11 LAB — MICROSCOPIC EXAMINATION: WBC, UA: >30 /hpf — AB (ref 0–5)

## 2021-12-11 MED ORDER — NITROFURANTOIN MONOHYD MACRO 100 MG PO CAPS
100.0000 mg | ORAL_CAPSULE | Freq: Two times a day (BID) | ORAL | 0 refills | Status: AC
  Filled 2021-12-11: qty 14, 7d supply, fill #0

## 2021-12-11 NOTE — Progress Notes (Signed)
? ?12/11/2021 ?3:20 PM  ? ?[name] ?1960-11-25 ?[id number] ? ?CC: ?Chief Complaint  ?Patient presents with  ? Follow-up  ? Urinary Tract Infection  ? ?HPI: ?[name] is a 61 y.o. female with PMH vulvovaginal atrophy, cystocele, diabetes, SUI, pelvic floor laxity, and rUTI who presents today for evaluation of possible UTI.  ? ?Today she reports an approximate 2-day history of frequency, urgency, dysuria, and malodorous urine. She used urine test strips at home that were positive for nitrites and leukocyte esterase.   ? ?She reports this is her 4th UTI in 6 months, while she previously only had them twice per year. She is concerned about her frequency of infection and would like to be more proactive in preventing them. She previously tried vaginal estrogen and had to stop it due to vaginal bleeding. She has taken cranberry supplements and probiotics in the past and doesn't feel they helped. ? ?In-office UA today positive for 2+ leukocyte esterase; urine microscopy with >30 WBCs/HPF and many bacteria.  ? ?PMH: ?Past Medical History:  ?Diagnosis Date  ? Allergy   ? Arthritis   ? osteoarthritis - fingers  ? Asthma   ? Chicken pox   ? Chronic UTI   ? Cystocele   ? 2nd degree  ? Endometriosis   ? Family history of polyps in the colon   ? Graves disease   ? History of heavy periods   ? Hole in the ear drum, right   ? Hyperlipemia   ? IBS (irritable bowel syndrome)   ? Perimenopausal   ? PONV (postoperative nausea and vomiting)   ? nausea only  ? Rectocele   ? mild  ? S/P endometrial ablation   ? Scoliosis   ? Skin cancer   ? SUI (stress urinary incontinence, female)   ? SVT (supraventricular tachycardia) ([place])   ? Urine incontinence   ? Wears contact lenses   ? Wears hearing aid in both ears   ? ? ?Surgical History: ?Past Surgical History:  ?Procedure Laterality Date  ? CATARACT EXTRACTION W/PHACO Right 11/03/2021  ? Procedure: CATARACT EXTRACTION PHACO AND INTRAOCULAR LENS PLACEMENT ([place]) RIGHT;  Surgeon:  Birder Robson, MD;  Location: [place];  Service: Ophthalmology;  Laterality: Right;  2.98 ?0:27.0  ? COLONOSCOPY WITH PROPOFOL N/A 07/10/2018  ? Procedure: COLONOSCOPY WITH PROPOFOL;  Surgeon: Manya Silvas, MD;  Location: [facility] ENDOSCOPY;  Service: Endoscopy;  Laterality: N/A;  ? endometrrial ablation    ? EYE SURGERY    ? knee athroscopy Left 2013  ? laprascopic surgery    ? endometreosis  ? NASOPHARYNGOSCOPY EUSTATION TUBE BALLOON DILATION Bilateral 05/18/2018  ? Procedure: NASOPHARYNGOSCOPY EUSTATION TUBE BALLOON DILATION OUTFRACTURE BILATERAL INFERIOR TURBINATES;  Surgeon: Margaretha Sheffield, MD;  Location: [place];  Service: ENT;  Laterality: Bilateral;  ? NASOPHARYNGOSCOPY EUSTATION TUBE BALLOON DILATION    ? nova-sure    ? THYROIDECTOMY, PARTIAL    ? TONSILLECTOMY AND ADENOIDECTOMY    ? tubes in ears    ? ? ?Home Medications:  ?Allergies as of 12/11/2021   ? ?   Reactions  ? Ciprofloxacin Anaphylaxis  ? Floxin [ofloxacin] Anaphylaxis  ? Levofloxacin Anaphylaxis  ? Quinolones Anaphylaxis  ? Amoxicillin   ? fever  ? Bextra [valdecoxib]   ? Gyne-lotrimin [clotrimazole]   ? redness  ? Penicillins Rash  ? Septra [sulfamethoxazole-trimethoprim] Rash  ? Flushed and fever.  ? Sulfa Antibiotics Rash  ? fever  ? ?  ? ?  ?Medication  List  ?  ? ?  ? Accurate as of December 11, 2021  3:20 PM. If you have any questions, ask your nurse or doctor.  ?  ?  ? ?  ? ?STOP taking these medications   ? ?Ilevro 0.3 % ophthalmic suspension ?Generic drug: nepafenac ?Stopped by: Debroah Loop, PA-C ?  ?imipramine 25 MG tablet ?Commonly known as: TOFRANIL ?Stopped by: Debroah Loop, PA-C ?  ?ketorolac 0.5 % ophthalmic solution ?Commonly known as: ACULAR ?Stopped by: Debroah Loop, PA-C ?  ?prednisoLONE acetate 1 % ophthalmic suspension ?Commonly known as: PRED FORTE ?Stopped by: Debroah Loop, PA-C ?  ? ?  ? ?TAKE these medications   ? ?albuterol 108 (90 Base) MCG/ACT  inhaler ?Commonly known as: VENTOLIN HFA ?Inhale 2 inhalations into the lungs every 4 (four) hours as needed for Wheezing ?  ?cholecalciferol 1000 units tablet ?Commonly known as: VITAMIN D ?Take 1,000 Units by mouth daily. ?  ?fexofenadine 180 MG tablet ?Commonly known as: ALLEGRA ?Allegra Allergy 180 mg tablet ? Take 1 tablet every day by oral route. ?  ?fluticasone 50 MCG/ACT nasal spray ?Commonly known as: FLONASE ?Place 2 sprays into both nostrils once daily as needed ?  ?levothyroxine 100 MCG tablet ?Commonly known as: SYNTHROID ?Take 1 tablet (100 mcg total) by mouth once daily ?What changed: Another medication with the same name was removed. Continue taking this medication, and follow the directions you see here. ?Changed by: Debroah Loop, PA-C ?  ?lovastatin 20 MG tablet ?Commonly known as: MEVACOR ?Take 1 tablet (20 mg total) by mouth daily with dinner ?  ?metoprolol succinate 50 MG 24 hr tablet ?Commonly known as: TOPROL-XL ?Take 1 tablet (50 mg total) by mouth once daily ?  ?montelukast 10 MG tablet ?Commonly known as: SINGULAIR ?Take 1 tablet (10 mg total) by mouth at bedtime ?  ?Multi-Vitamins Tabs ?Take by mouth. ?  ?nitrofurantoin (macrocrystal-monohydrate) 100 MG capsule ?Commonly known as: MACROBID ?Take 1 capsule (100 mg total) by mouth 2 (two) times daily for 7 days. ?What changed: when to take this ?Changed by: Debroah Loop, PA-C ?  ? ?  ? ? ?Allergies:  ?Allergies  ?Allergen Reactions  ? Ciprofloxacin Anaphylaxis  ? Floxin [Ofloxacin] Anaphylaxis  ? Levofloxacin Anaphylaxis  ? Quinolones Anaphylaxis  ? Amoxicillin   ?  fever  ? Bextra [Valdecoxib]   ? Gyne-Lotrimin [Clotrimazole]   ?  redness  ? Penicillins Rash  ? Septra [Sulfamethoxazole-Trimethoprim] Rash  ?  Flushed and fever.  ? Sulfa Antibiotics Rash  ?  fever  ? ? ?Family History: ?Family History  ?Problem Relation Age of Onset  ? Cancer Mother   ? Hyperlipidemia Mother   ? Heart disease Mother   ? Hypertension Mother    ? Breast cancer Mother 27  ? Kidney failure Mother   ? Stroke Mother   ? Hyperlipidemia Father   ? Heart disease Father   ? Hypertension Father   ? Cancer Father   ?     colon  ? Breast cancer Paternal Aunt 65  ? Breast cancer Maternal Aunt 63  ? Kidney cancer Neg Hx   ? Bladder Cancer Neg Hx   ? Prostate cancer Neg Hx   ? Ovarian cancer Neg Hx   ? Diabetes Neg Hx   ? ? ?Social History:  ? reports that she has never smoked. She has never been exposed to tobacco smoke. She has never used smokeless tobacco. She reports that she does not drink alcohol and does  not use drugs. ? ?Physical Exam: ?BP 119/78 (BP Location: Left Arm, Patient Position: Sitting, Cuff Size: Normal)   Pulse 83   Temp 97.7 ?F (36.5 ?C) (Oral)   Ht '5\' 2"'$  (1.575 m)   Wt 130 lb (59 kg)   LMP 07/01/2014 (Exact Date)   BMI 23.78 kg/m?   ?Constitutional:  Alert and oriented, no acute distress, nontoxic appearing ?HEENT: [place], AT ?Cardiovascular: No clubbing, cyanosis, or edema ?Respiratory: Normal respiratory effort, no increased work of breathing ?Skin: No rashes, bruises or suspicious lesions ?Neurologic: Grossly intact, no focal deficits, moving all 4 extremities ?Psychiatric: Normal mood and affect ? ?Laboratory Data: ?Results for orders placed or performed in visit on 12/11/21  ?CULTURE, URINE COMPREHENSIVE  ? Specimen: Urine  ? UR  ?Result Value Ref Range  ? Urine Culture, Comprehensive Final report (A)   ? Organism ID, Bacteria Escherichia coli (A)   ? ANTIMICROBIAL SUSCEPTIBILITY Comment   ?Microscopic Examination  ? Urine  ?Result Value Ref Range  ? WBC, UA >30 (A) 0 - 5 /hpf  ? RBC 0-2 0 - 2 /hpf  ? Epithelial Cells (non renal) 0-10 0 - 10 /hpf  ? Bacteria, UA Many (A) None seen/Few  ?Urinalysis, Complete  ?Result Value Ref Range  ? Specific Gravity, UA 1.015 1.005 - 1.030  ? pH, UA 7.0 5.0 - 7.5  ? Color, UA Yellow Yellow  ? Appearance Ur Clear Clear  ? Leukocytes,UA 2+ (A) Negative  ? Protein,UA Negative Negative/Trace  ? Glucose, UA  Negative Negative  ? Ketones, UA Negative Negative  ? RBC, UA Negative Negative  ? Bilirubin, UA Negative Negative  ? Urobilinogen, Ur 0.2 0.2 - 1.0 mg/dL  ? Nitrite, UA Negative Negative  ? Microscopic Examination See bel

## 2021-12-14 LAB — CULTURE, URINE COMPREHENSIVE

## 2021-12-15 ENCOUNTER — Other Ambulatory Visit: Payer: Self-pay | Admitting: *Deleted

## 2021-12-15 ENCOUNTER — Other Ambulatory Visit: Payer: Self-pay

## 2021-12-15 ENCOUNTER — Encounter: Payer: Self-pay | Admitting: Ophthalmology

## 2021-12-15 MED ORDER — METOPROLOL SUCCINATE ER 50 MG PO TB24
50.0000 mg | ORAL_TABLET | Freq: Every day | ORAL | 3 refills | Status: DC
  Filled 2021-12-15: qty 90, 90d supply, fill #0
  Filled 2022-03-11: qty 90, 90d supply, fill #1
  Filled 2022-06-14: qty 90, 90d supply, fill #2
  Filled 2022-09-12: qty 90, 90d supply, fill #3

## 2021-12-15 MED ORDER — NITROFURANTOIN MONOHYD MACRO 100 MG PO CAPS
100.0000 mg | ORAL_CAPSULE | Freq: Every day | ORAL | 0 refills | Status: AC
  Filled 2021-12-15: qty 90, 90d supply, fill #0

## 2021-12-16 ENCOUNTER — Other Ambulatory Visit: Payer: Self-pay

## 2021-12-17 NOTE — Discharge Instructions (Signed)

## 2021-12-22 ENCOUNTER — Encounter
Admission: RE | Disposition: A | Discharge status: Home / Self Care | Payer: Self-pay | Source: Home / Self Care | Attending: Ophthalmology

## 2021-12-22 ENCOUNTER — Ambulatory Visit: Payer: 59 | Admitting: Anesthesiology

## 2021-12-22 ENCOUNTER — Other Ambulatory Visit: Payer: Self-pay

## 2021-12-22 ENCOUNTER — Ambulatory Visit
Admission: RE | Admit: 2021-12-22 | Discharge: 2021-12-22 | Disposition: A | Discharge status: Home / Self Care | Payer: 59 | Attending: Ophthalmology | Admitting: Ophthalmology

## 2021-12-22 ENCOUNTER — Encounter: Payer: Self-pay | Admitting: Ophthalmology

## 2021-12-22 DIAGNOSIS — H2512 Age-related nuclear cataract, left eye: Secondary | ICD-10-CM | POA: Insufficient documentation

## 2021-12-22 DIAGNOSIS — Z79899 Other long term (current) drug therapy: Secondary | ICD-10-CM | POA: Insufficient documentation

## 2021-12-22 DIAGNOSIS — J45909 Unspecified asthma, uncomplicated: Secondary | ICD-10-CM | POA: Insufficient documentation

## 2021-12-22 DIAGNOSIS — H25812 Combined forms of age-related cataract, left eye: Secondary | ICD-10-CM | POA: Diagnosis not present

## 2021-12-22 HISTORY — PX: CATARACT EXTRACTION W/PHACO: SHX586

## 2021-12-22 SURGERY — PHACOEMULSIFICATION, CATARACT, WITH IOL INSERTION
Anesthesia: Monitor Anesthesia Care | Site: Eye | Laterality: Left

## 2021-12-22 MED ORDER — ARMC OPHTHALMIC DILATING DROPS
1.0000 "application " | OPHTHALMIC | Status: DC | PRN
  Administered 2021-12-22 (×3): 1 via OPHTHALMIC

## 2021-12-22 MED ORDER — BRIMONIDINE TARTRATE-TIMOLOL 0.2-0.5 % OP SOLN
OPHTHALMIC | Status: DC | PRN
  Administered 2021-12-22: 1 [drp] via OPHTHALMIC

## 2021-12-22 MED ORDER — TETRACAINE HCL 0.5 % OP SOLN
1.0000 [drp] | OPHTHALMIC | Status: DC | PRN
  Administered 2021-12-22 (×3): 1 [drp] via OPHTHALMIC

## 2021-12-22 MED ORDER — SIGHTPATH DOSE#1 BSS IO SOLN
INTRAOCULAR | Status: DC | PRN
  Administered 2021-12-22: 51 mL via OPHTHALMIC

## 2021-12-22 MED ORDER — FENTANYL CITRATE (PF) 100 MCG/2ML IJ SOLN
INTRAMUSCULAR | Status: DC | PRN
  Administered 2021-12-22: 50 ug via INTRAVENOUS

## 2021-12-22 MED ORDER — SIGHTPATH DOSE#1 BSS IO SOLN
INTRAOCULAR | Status: DC | PRN
  Administered 2021-12-22: 15 mL via INTRAOCULAR

## 2021-12-22 MED ORDER — MIDAZOLAM HCL 2 MG/2ML IJ SOLN
INTRAMUSCULAR | Status: DC | PRN
  Administered 2021-12-22 (×2): 1 mg via INTRAVENOUS

## 2021-12-22 MED ORDER — SIGHTPATH DOSE#1 BSS IO SOLN
INTRAOCULAR | Status: DC | PRN
  Administered 2021-12-22: 2 mL

## 2021-12-22 MED ORDER — LACTATED RINGERS IV SOLN: INTRAVENOUS | Status: DC

## 2021-12-22 MED ORDER — SIGHTPATH DOSE#1 NA CHONDROIT SULF-NA HYALURON 40-17 MG/ML IO SOLN
INTRAOCULAR | Status: DC | PRN
  Administered 2021-12-22: 1 mL via INTRAOCULAR

## 2021-12-22 MED ORDER — CEFUROXIME OPHTHALMIC INJECTION 1 MG/0.1 ML
INJECTION | OPHTHALMIC | Status: DC | PRN
  Administered 2021-12-22: 0.1 mL via INTRACAMERAL

## 2021-12-22 SURGICAL SUPPLY — 10 items
CATARACT SUITE SIGHTPATH (MISCELLANEOUS) ×2 IMPLANT
FEE CATARACT SUITE SIGHTPATH (MISCELLANEOUS) ×1 IMPLANT
GLOVE SURG ENC TEXT LTX SZ8 (GLOVE) ×2 IMPLANT
GLOVE SURG TRIUMPH 8.0 PF LTX (GLOVE) ×2 IMPLANT
LENS IOL TECNIS EYHANCE 12.0 (Intraocular Lens) ×1 IMPLANT
NDL FILTER BLUNT 18X1 1/2 (NEEDLE) ×1 IMPLANT
NEEDLE FILTER BLUNT 18X 1/2SAF (NEEDLE) ×1
NEEDLE FILTER BLUNT 18X1 1/2 (NEEDLE) ×1 IMPLANT
SYR 3ML LL SCALE MARK (SYRINGE) ×2 IMPLANT
WATER STERILE IRR 250ML POUR (IV SOLUTION) ×2 IMPLANT

## 2021-12-22 NOTE — Transfer of Care (Signed)
Immediate Anesthesia Transfer of Care Note ? ?Patient: [name] ? ?Procedure(s) Performed: CATARACT EXTRACTION PHACO AND INTRAOCULAR LENS PLACEMENT (IOC) LEFT 2.38 00:26.0 (Left: Eye) ? ?Patient Location: PACU ? ?Anesthesia Type: MAC ? ?Level of Consciousness: awake, alert  and patient cooperative ? ?Airway and Oxygen Therapy: Patient Spontanous Breathing and Patient connected to supplemental oxygen ? ?Post-op Assessment: Post-op Vital signs reviewed, Patient's Cardiovascular Status Stable, Respiratory Function Stable, Patent Airway and No signs of Nausea or vomiting ? ?Post-op Vital Signs: Reviewed and stable ? ?Complications: No notable events documented. ? ?

## 2021-12-22 NOTE — Anesthesia Preprocedure Evaluation (Signed)
Anesthesia Evaluation  ?Patient identified by MRN, date of birth, ID band ?Patient awake ? ? ? ?Reviewed: ?Allergy & Precautions, H&P , NPO status , Patient's Chart, lab work & pertinent test results, reviewed documented beta blocker date and time  ? ?History of Anesthesia Complications ?(+) PONV and history of anesthetic complications ? ?Airway ?Mallampati: II ? ?TM Distance: >3 FB ?Neck ROM: full ? ? ? Dental ?no notable dental hx. ? ?  ?Pulmonary ?asthma ,  ?  ?Pulmonary exam normal ?breath sounds clear to auscultation ? ? ? ? ? ? Cardiovascular ?Exercise Tolerance: Good ?negative cardio ROS ? ? ?Rhythm:regular Rate:Normal ? ? ?  ?Neuro/Psych ?negative neurological ROS ? negative psych ROS  ? GI/Hepatic ?negative GI ROS, Neg liver ROS,   ?Endo/Other  ?negative endocrine ROS ? Renal/GU ?negative Renal ROS  ?negative genitourinary ?  ?Musculoskeletal ? ? Abdominal ?  ?Peds ? Hematology ?negative hematology ROS ?(+)   ?Anesthesia Other Findings ? ? Reproductive/Obstetrics ?negative OB ROS ? ?  ? ? ? ? ? ? ? ? ? ? ? ? ? ?  ?  ? ? ? ? ? ? ? ? ?Anesthesia Physical ?Anesthesia Plan ? ?ASA: 2 ? ?Anesthesia Plan: MAC  ? ?Post-op Pain Management:   ? ?Induction:  ? ?PONV Risk Score and Plan: 3 and Ondansetron, Dexamethasone, Diphenhydramine and Treatment may vary due to age or medical condition ? ?Airway Management Planned:  ? ?Additional Equipment:  ? ?Intra-op Plan:  ? ?Post-operative Plan:  ? ?Informed Consent: I have reviewed the patients History and Physical, chart, labs and discussed the procedure including the risks, benefits and alternatives for the proposed anesthesia with the patient or authorized representative who has indicated his/her understanding and acceptance.  ? ? ? ?Dental Advisory Given ? ?Plan Discussed with: CRNA ? ?Anesthesia Plan Comments:   ? ? ? ? ? ? ?Anesthesia Quick Evaluation ? ?

## 2021-12-22 NOTE — Op Note (Signed)
PREOPERATIVE DIAGNOSIS:  Nuclear sclerotic cataract of the left eye. ?  ?POSTOPERATIVE DIAGNOSIS:  Nuclear sclerotic cataract of the left eye. ?  ?OPERATIVE PROCEDURE:ORPROCALL@ ?  ?SURGEON:  Desiree Robson, MD. ?  ?ANESTHESIA: ? ?Anesthesiologist: Marice Potter, MD ?CRNA: Garner Nash, CRNA ? ?1.      Managed anesthesia care. ?2.     0.14m of Shugarcaine was instilled following the paracentesis ?  ?COMPLICATIONS:  None. ?  ?TECHNIQUE:   Stop and chop ?  ?DESCRIPTION OF PROCEDURE:  The patient was examined and consented in the preoperative holding area where the aforementioned topical anesthesia was applied to the left eye and then brought back to the Operating Room where the left eye was prepped and draped in the usual sterile ophthalmic fashion and a lid speculum was placed. A paracentesis was created with the side port blade and the anterior chamber was filled with viscoelastic. A near clear corneal incision was performed with the steel keratome. A continuous curvilinear capsulorrhexis was performed with a cystotome followed by the capsulorrhexis forceps. Hydrodissection and hydrodelineation were carried out with BSS on a blunt cannula. The lens was removed in a stop and chop  technique and the remaining cortical material was removed with the irrigation-aspiration handpiece. The capsular bag was inflated with viscoelastic and the Technis [id number] lens was placed in the capsular bag without complication. The remaining viscoelastic was removed from the eye with the irrigation-aspiration handpiece. The wounds were hydrated. The anterior chamber was flushed with BSS and the eye was inflated to physiologic pressure. 0.128mVigamox was placed in the anterior chamber. The wounds were found to be water tight. The eye was dressed with Combigan. The patient was given protective glasses to wear throughout the day and a shield with which to sleep tonight. The patient was also given drops with which to begin a drop  regimen today and will follow-up with me in one day. ?Implant Name Type Inv. Item Serial No. Manufacturer Lot No. LRB No. Used Action  ?LENS IOL TECNIS EYHANCE 12.0 - [id number] Lens LENS IOL TECNIS EYHANCE 12.0 [id number]  Left 1 Implanted  ?  ?Procedure(s): ?CATARACT EXTRACTION PHACO AND INTRAOCULAR LENS PLACEMENT (IOC) LEFT 2.38 00:26.0 (Left) ? ?Electronically signed: WiBirder Reynolds/09/2021 10:39 AM ? ?

## 2021-12-22 NOTE — H&P (Signed)
[place]  ? ?Primary Care Physician:  Dion Body, MD ?Ophthalmologist: Dr.Jlynn Langille ? ?Pre-Procedure History & Physical: ?HPI:  [name] is a 61 y.o. female here for cataract surgery. ?  ?Past Medical History:  ?Diagnosis Date  ? Allergy   ? Arthritis   ? osteoarthritis - fingers  ? Asthma   ? Chicken pox   ? Chronic UTI   ? Cystocele   ? 2nd degree  ? Endometriosis   ? Family history of polyps in the colon   ? Graves disease   ? History of heavy periods   ? Hole in the ear drum, right   ? Hyperlipemia   ? IBS (irritable bowel syndrome)   ? Perimenopausal   ? PONV (postoperative nausea and vomiting)   ? nausea only  ? Rectocele   ? mild  ? S/P endometrial ablation   ? Scoliosis   ? Skin cancer   ? SUI (stress urinary incontinence, female)   ? SVT (supraventricular tachycardia) ([place])   ? Urine incontinence   ? Wears contact lenses   ? Wears hearing aid in both ears   ? ? ?Past Surgical History:  ?Procedure Laterality Date  ? CATARACT EXTRACTION W/PHACO Right 11/03/2021  ? Procedure: CATARACT EXTRACTION PHACO AND INTRAOCULAR LENS PLACEMENT ([place]) RIGHT;  Surgeon: Birder Robson, MD;  Location: [facility];  Service: Ophthalmology;  Laterality: Right;  2.98 ?0:27.0  ? COLONOSCOPY WITH PROPOFOL N/A 07/10/2018  ? Procedure: COLONOSCOPY WITH PROPOFOL;  Surgeon: Manya Silvas, MD;  [facility]COPY;  Service: Endoscopy;  Laterality: N/A;  ? endometrrial ablation    ? EYE SURGERY    ? knee athroscopy Left 2013  ? laprascopic surgery    ? endometreosis  ? NASOPHARYNGOSCOPY EUSTATION TUBE BALLOON DILATION Bilateral 05/18/2018  ? Procedure: NASOPHARYNGOSCOPY EUSTATION TUBE BALLOON DILATION OUTFRACTURE BILATERAL INFERIOR TURBINATES;  Surgeon: Margaretha Sheffield, MD;  Location: [facility];  Service: ENT;  Laterality: Bilateral;  ? NASOPHARYNGOSCOPY EUSTATION TUBE BALLOON DILATION    ? nova-sure    ? THYROIDECTOMY, PARTIAL    ? TONSILLECTOMY AND ADENOIDECTOMY    ? tubes in ears     ? ? ?Prior to Admission medications   ?Medication Sig Start Date End Date Taking? Authorizing Provider  ?albuterol (VENTOLIN HFA) 108 (90 Base) MCG/ACT inhaler Inhale 2 inhalations into the lungs every 4 (four) hours as needed for Wheezing 05/21/21  Yes   ?cholecalciferol (VITAMIN D) 1000 UNITS tablet Take 1,000 Units by mouth daily.   Yes [provider]  ?EPINEPHrine 0.3 mg/0.3 mL IJ SOAJ injection Inject 0.3 mg into the muscle as needed for anaphylaxis.   Yes [provider]  ?fexofenadine (ALLEGRA) 180 MG tablet Allegra Allergy 180 mg tablet ? Take 1 tablet every day by oral route.   Yes [provider]  ?fluticasone (FLONASE) 50 MCG/ACT nasal spray Place 2 sprays into both nostrils once daily as needed 05/21/21  Yes   ?imipramine (TOFRANIL) 50 MG tablet Take 50 mg by mouth at bedtime.   Yes [provider]  ?levothyroxine (SYNTHROID) 100 MCG tablet Take 1 tablet (100 mcg total) by mouth once daily 11/04/21  Yes   ?lovastatin (MEVACOR) 20 MG tablet Take 1 tablet (20 mg total) by mouth daily with dinner 10/15/21  Yes   ?metoprolol succinate (TOPROL-XL) 50 MG 24 hr tablet Take 1 tablet (50 mg total) by mouth once daily 12/15/21  Yes   ?montelukast (SINGULAIR) 10 MG tablet Take 1 tablet (10 mg  total) by mouth at bedtime 07/28/21  Yes   ?Multiple Vitamin (MULTI-VITAMINS) TABS Take by mouth.   Yes [provider]  ?nitrofurantoin, macrocrystal-monohydrate, (MACROBID) 100 MG capsule Take 1 capsule (100 mg total) by mouth daily. 12/15/21 03/16/22 Yes Vaillancourt, Aldona Bar, PA-C  ? ? ?Allergies as of 12/01/2021 - Review Complete 11/03/2021  ?Allergen Reaction Noted  ? Ciprofloxacin Anaphylaxis 01/18/2014  ? Floxin [ofloxacin] Anaphylaxis 01/18/2014  ? Levofloxacin Anaphylaxis   ? Quinolones Anaphylaxis 06/15/2013  ? Amoxicillin  01/18/2014  ? Bextra [valdecoxib]  01/18/2014  ? Gyne-lotrimin [clotrimazole]  01/18/2014  ? Penicillins Rash 12/25/2014  ? Septra  [sulfamethoxazole-trimethoprim] Rash 01/18/2014  ? Sulfa antibiotics Rash 12/25/2014  ? ? ?Family History  ?Problem Relation Age of Onset  ? Cancer Mother   ? Hyperlipidemia Mother   ? Heart disease Mother   ? Hypertension Mother   ? Breast cancer Mother 66  ? Kidney failure Mother   ? Stroke Mother   ? Hyperlipidemia Father   ? Heart disease Father   ? Hypertension Father   ? Cancer Father   ?     colon  ? Breast cancer Paternal Aunt 61  ? Breast cancer Maternal Aunt 73  ? Kidney cancer Neg Hx   ? Bladder Cancer Neg Hx   ? Prostate cancer Neg Hx   ? Ovarian cancer Neg Hx   ? Diabetes Neg Hx   ? ? ?Social History  ? ?Socioeconomic History  ? Marital status: Married  ?  Spouse name: Not on file  ? Number of children: Not on file  ? Years of education: Not on file  ? Highest education level: Not on file  ?Occupational History  ? Occupation: [employer]  ?Tobacco Use  ? Smoking status: Never  ?  Passive exposure: Never  ? Smokeless tobacco: Never  ?Vaping Use  ? Vaping Use: Never used  ?Substance and Sexual Activity  ? Alcohol use: No  ?  Alcohol/week: 0.0 standard drinks  ? Drug use: No  ? Sexual activity: Yes  ?  Birth control/protection: Post-menopausal  ?Other Topics Concern  ? Not on file  ?Social History Narrative  ? Not on file  ? ?Social Determinants of Health  ? ?Financial Resource Strain: Not on file  ?Food Insecurity: Not on file  ?Transportation Needs: Not on file  ?Physical Activity: Not on file  ?Stress: Not on file  ?Social Connections: Not on file  ?Intimate Partner Violence: Not on file  ? ? ?Review of Systems: ?See HPI, otherwise negative ROS ? ?Physical Exam: ?BP 124/71   Pulse 82   Temp (!) 97.5 ?F (36.4 ?C) (Temporal)   Resp 18   Ht '5\' 2"'$  (1.575 m)   Wt 59.9 kg   LMP 07/01/2014 (Exact Date)   SpO2 99%   BMI 24.14 kg/m?  ?General:   Alert, cooperative in NAD ?Head:  Normocephalic and atraumatic. ?Respiratory:  Normal work of breathing. ?Cardiovascular:  RRR ? ?Impression/Plan: ?Desiree Jackowski  Reynolds is here for cataract surgery. ? ?Risks, benefits, limitations, and alternatives regarding cataract surgery have been reviewed with the patient.  Questions have been answered.  All parties agreeable. ? ? ?Birder Robson, MD  12/22/2021, 10:13 AM ? ? ?

## 2021-12-22 NOTE — Anesthesia Postprocedure Evaluation (Signed)
Anesthesia Post Note ? ?Patient: [name] ? ?Procedure(s) Performed: CATARACT EXTRACTION PHACO AND INTRAOCULAR LENS PLACEMENT ([facility]) LEFT 2.38 00:26.0 (Left: Eye) ? ? ?  ?Patient location during evaluation: PACU ?Anesthesia Type: MAC ?Level of consciousness: awake and alert ?Pain management: pain level controlled ?Vital Signs Assessment: post-procedure vital signs reviewed and stable ?Respiratory status: spontaneous breathing, nonlabored ventilation, respiratory function stable and patient connected to nasal cannula oxygen ?Cardiovascular status: blood pressure returned to baseline and stable ?Postop Assessment: no apparent nausea or vomiting ?Anesthetic complications: no ? ? ?No notable events documented. ? ?Maurie Olesen ELAINE ? ? ? ? ? ?

## 2021-12-23 ENCOUNTER — Encounter: Payer: Self-pay | Admitting: Ophthalmology

## 2021-12-28 ENCOUNTER — Other Ambulatory Visit: Payer: Self-pay

## 2021-12-30 ENCOUNTER — Other Ambulatory Visit: Payer: Self-pay

## 2021-12-30 MED ORDER — COVID-19 AT HOME ANTIGEN TEST VI KIT
PACK | 0 refills | Status: DC
Start: 2021-12-30 — End: 2022-06-23
  Filled 2021-12-30: qty 2, 4d supply, fill #0

## 2021-12-30 MED ORDER — PREDNISOLONE ACETATE 1 % OP SUSP
OPHTHALMIC | 0 refills | Status: DC
  Filled 2021-12-30: qty 5, 18d supply, fill #0

## 2022-01-08 DIAGNOSIS — Z8639 Personal history of other endocrine, nutritional and metabolic disease: Secondary | ICD-10-CM | POA: Diagnosis not present

## 2022-01-12 ENCOUNTER — Other Ambulatory Visit: Payer: Self-pay

## 2022-01-25 ENCOUNTER — Other Ambulatory Visit: Payer: Self-pay

## 2022-01-26 ENCOUNTER — Other Ambulatory Visit: Payer: Self-pay

## 2022-01-26 MED ORDER — MONTELUKAST SODIUM 10 MG PO TABS
10.0000 mg | ORAL_TABLET | Freq: Every day | ORAL | 1 refills | Status: DC
  Filled 2022-01-26: qty 90, 90d supply, fill #0
  Filled 2022-04-21: qty 90, 90d supply, fill #1

## 2022-02-01 ENCOUNTER — Other Ambulatory Visit: Payer: Self-pay

## 2022-02-01 MED ORDER — LEVOTHYROXINE SODIUM 100 MCG PO TABS
ORAL_TABLET | ORAL | 1 refills | Status: DC
  Filled 2022-02-01: qty 90, 90d supply, fill #0
  Filled 2022-05-01: qty 90, 90d supply, fill #1

## 2022-02-18 ENCOUNTER — Other Ambulatory Visit: Payer: Self-pay

## 2022-02-18 MED ORDER — IMIPRAMINE HCL 25 MG PO TABS
ORAL_TABLET | ORAL | 1 refills | Status: DC
  Filled 2022-02-18: qty 180, 90d supply, fill #0
  Filled 2022-05-30: qty 180, 90d supply, fill #1

## 2022-03-11 ENCOUNTER — Other Ambulatory Visit: Payer: Self-pay

## 2022-04-15 ENCOUNTER — Other Ambulatory Visit: Payer: Self-pay

## 2022-04-15 MED ORDER — LOVASTATIN 20 MG PO TABS
20.0000 mg | ORAL_TABLET | Freq: Every day | ORAL | 1 refills | Status: DC
  Filled 2022-04-15: qty 90, 90d supply, fill #0
  Filled 2022-07-04: qty 90, 90d supply, fill #1

## 2022-04-22 ENCOUNTER — Other Ambulatory Visit: Payer: Self-pay

## 2022-05-02 ENCOUNTER — Other Ambulatory Visit: Payer: Self-pay

## 2022-05-03 ENCOUNTER — Other Ambulatory Visit: Payer: Self-pay

## 2022-05-07 DIAGNOSIS — E7800 Pure hypercholesterolemia, unspecified: Secondary | ICD-10-CM | POA: Diagnosis not present

## 2022-05-07 DIAGNOSIS — R7303 Prediabetes: Secondary | ICD-10-CM | POA: Diagnosis not present

## 2022-05-12 ENCOUNTER — Other Ambulatory Visit: Payer: Self-pay

## 2022-05-12 DIAGNOSIS — R7303 Prediabetes: Secondary | ICD-10-CM | POA: Diagnosis not present

## 2022-05-12 DIAGNOSIS — J4520 Mild intermittent asthma, uncomplicated: Secondary | ICD-10-CM | POA: Diagnosis not present

## 2022-05-12 DIAGNOSIS — Z8639 Personal history of other endocrine, nutritional and metabolic disease: Secondary | ICD-10-CM | POA: Diagnosis not present

## 2022-05-12 DIAGNOSIS — E7800 Pure hypercholesterolemia, unspecified: Secondary | ICD-10-CM | POA: Diagnosis not present

## 2022-05-12 MED ORDER — FREESTYLE LIBRE 2 READER DEVI
0 refills | Status: DC
  Filled 2022-05-12: qty 1, 1d supply, fill #0

## 2022-05-12 MED ORDER — FREESTYLE LIBRE 2 SENSOR MISC
3 refills | Status: DC
  Filled 2022-05-12: qty 2, 28d supply, fill #0
  Filled 2022-05-30: qty 2, 28d supply, fill #1
  Filled 2022-07-04: qty 2, 28d supply, fill #2
  Filled 2022-08-02: qty 2, 28d supply, fill #3

## 2022-05-31 ENCOUNTER — Other Ambulatory Visit: Payer: Self-pay

## 2022-06-15 ENCOUNTER — Other Ambulatory Visit: Payer: Self-pay | Admitting: Obstetrics and Gynecology

## 2022-06-15 ENCOUNTER — Other Ambulatory Visit: Payer: Self-pay

## 2022-06-15 DIAGNOSIS — Z1231 Encounter for screening mammogram for malignant neoplasm of breast: Secondary | ICD-10-CM

## 2022-06-23 ENCOUNTER — Other Ambulatory Visit: Payer: Self-pay

## 2022-06-23 ENCOUNTER — Ambulatory Visit (INDEPENDENT_AMBULATORY_CARE_PROVIDER_SITE_OTHER): Payer: 59 | Admitting: Urology

## 2022-06-23 VITALS — BP 139/85 | HR 88 | Ht 62.0 in | Wt 132.0 lb

## 2022-06-23 DIAGNOSIS — Z85828 Personal history of other malignant neoplasm of skin: Secondary | ICD-10-CM | POA: Diagnosis not present

## 2022-06-23 DIAGNOSIS — N390 Urinary tract infection, site not specified: Secondary | ICD-10-CM

## 2022-06-23 DIAGNOSIS — L814 Other melanin hyperpigmentation: Secondary | ICD-10-CM | POA: Diagnosis not present

## 2022-06-23 DIAGNOSIS — D225 Melanocytic nevi of trunk: Secondary | ICD-10-CM | POA: Diagnosis not present

## 2022-06-23 DIAGNOSIS — D2261 Melanocytic nevi of right upper limb, including shoulder: Secondary | ICD-10-CM | POA: Diagnosis not present

## 2022-06-23 DIAGNOSIS — D2262 Melanocytic nevi of left upper limb, including shoulder: Secondary | ICD-10-CM | POA: Diagnosis not present

## 2022-06-23 LAB — URINALYSIS, COMPLETE
Bilirubin, UA: NEGATIVE
Glucose, UA: NEGATIVE
Ketones, UA: NEGATIVE
Leukocytes,UA: NEGATIVE
Nitrite, UA: NEGATIVE
Protein,UA: NEGATIVE
RBC, UA: NEGATIVE
Specific Gravity, UA: 1.01 (ref 1.005–1.030)
Urobilinogen, Ur: 0.2 mg/dL (ref 0.2–1.0)
pH, UA: 6 (ref 5.0–7.5)

## 2022-06-23 LAB — MICROSCOPIC EXAMINATION

## 2022-06-23 LAB — BLADDER SCAN AMB NON-IMAGING

## 2022-06-23 MED ORDER — NITROFURANTOIN MONOHYD MACRO 100 MG PO CAPS
100.0000 mg | ORAL_CAPSULE | Freq: Two times a day (BID) | ORAL | 0 refills | Status: DC
  Filled 2022-06-23: qty 14, 7d supply, fill #0

## 2022-06-23 NOTE — Progress Notes (Unsigned)
1 

## 2022-06-23 NOTE — Progress Notes (Unsigned)
06/23/2022 8:18 AM   [name] 1960-09-26 [id number]  Referring provider: Dion Body, MD [facility] [place],  [place] [address]  Chief Complaint  Patient presents with   Follow-up    Urological history: 1. rUTI's -contributing factors of vaginal atrophy, age, incontinence, consumption of surgery drinks, constipation, diabetes and intercourse -Documented urine cultures over the last year  December 11, 2021 E. Coli  August 31, 2021 E. coli -taking cranberry tablets  2. SUI -contributing factors of vaginal atrophy, age, cystocele, pelvic floor laxity and diabetes  3. Cystocele -grade 1-2 cystocele   HPI: [name] is a 61 y.o. female who presents today for her yearly visit.  She continues to struggle with recurrent UTIs.  She was on a course of 3 months of prophylactic Macrobid that ended in August, but then in September she started experiencing another UTI.  She has Macrobid on hand that she would use post-coitally and she would self treat with this antibiotic.  She has had to treat UTI's > 4 times this year.    Her symptoms of UTI consist of LBP, malodorous urine, frequency, urgency and dysuria.  Most recently, she started experiencing the symptoms on Sunday and started the nitrofurantoin.  She continues to have symptoms.    On the OAB questionnaire, she indicates that at baseline she has 1-7 daytime urinations, nocturia x1-2 and a mild urge to urinate.  Her urge to urinate is strong now, but she believes that is due to UTI.  She has urge incontinence 1-2 times weekly.  She wears panty liners daily.    Patient denies any modifying or aggravating factors.  Patient denies any gross hematuria or suprapubic/flank pain.  Patient denies any fevers, chills, nausea or vomiting.    UA yellow clear, specific gravity 1.010, pH 6.0, 0-5 WBCs, 0-2 RBCs, 0-10 epithelial cells and few bacteria.    PVR 0 mL    PMH: Past Medical  History:  Diagnosis Date   Allergy    Arthritis    osteoarthritis - fingers   Asthma    Chicken pox    Chronic UTI    Cystocele    2nd degree   Endometriosis    Family history of polyps in the colon    Graves disease    History of heavy periods    Hole in the ear drum, right    Hyperlipemia    IBS (irritable bowel syndrome)    Perimenopausal    PONV (postoperative nausea and vomiting)    nausea only   Rectocele    mild   S/P endometrial ablation    Scoliosis    Skin cancer    SUI (stress urinary incontinence, female)    SVT (supraventricular tachycardia) ([facility])    Urine incontinence    Wears contact lenses    Wears hearing aid in both ears     Surgical History: Past Surgical History:  Procedure Laterality Date   CATARACT EXTRACTION W/PHACO Right 11/03/2021   Procedure: CATARACT EXTRACTION PHACO AND INTRAOCULAR LENS PLACEMENT ([place]) RIGHT;  Surgeon: Birder Robson, MD;  Location: [facility];  Service: Ophthalmology;  Laterality: Right;  2.98 0:27.0   CATARACT EXTRACTION W/PHACO Left 12/22/2021   Procedure: CATARACT EXTRACTION PHACO AND INTRAOCULAR LENS PLACEMENT ([facility]) LEFT 2.38 00:26.0;  Surgeon: Birder Robson, MD;  Location: [facility];  Service: Ophthalmology;  Laterality: Left;   COLONOSCOPY WITH PROPOFOL N/A 07/10/2018   Procedure: COLONOSCOPY WITH PROPOFOL;  Surgeon: Vira Agar,  Gavin Pound, MD;  Location: [facility] ENDOSCOPY;  Service: Endoscopy;  Laterality: N/A;   endometrrial ablation     EYE SURGERY     knee athroscopy Left 2013   laprascopic surgery     endometreosis   NASOPHARYNGOSCOPY EUSTATION TUBE BALLOON DILATION Bilateral 05/18/2018   Procedure: NASOPHARYNGOSCOPY EUSTATION TUBE BALLOON DILATION OUTFRACTURE BILATERAL INFERIOR TURBINATES;  Surgeon: Margaretha Sheffield, MD;  Location: [facility];  Service: ENT;  Laterality: Bilateral;   NASOPHARYNGOSCOPY EUSTATION TUBE BALLOON DILATION     nova-sure     THYROIDECTOMY, PARTIAL      TONSILLECTOMY AND ADENOIDECTOMY     tubes in ears      Home Medications:  Allergies as of 06/23/2022       Reactions   Ciprofloxacin Anaphylaxis   Floxin [ofloxacin] Anaphylaxis   Levofloxacin Anaphylaxis   Quinolones Anaphylaxis   Amoxicillin    fever   Bextra [valdecoxib]    Gyne-lotrimin [clotrimazole]    redness   Penicillins Rash   Septra [sulfamethoxazole-trimethoprim] Rash   Flushed and fever.   Sulfa Antibiotics Rash   fever        Medication List        Accurate as of June 23, 2022 11:59 PM. If you have any questions, ask your nurse or doctor.          STOP taking these medications    Carestart COVID-19 Home Test Kit Generic drug: COVID-19 At Home Antigen Test Stopped by: Zacchaeus Halm, PA-C   prednisoLONE acetate 1 % ophthalmic suspension Commonly known as: PRED FORTE Stopped by: Elayjah Chaney, PA-C       TAKE these medications    albuterol 108 (90 Base) MCG/ACT inhaler Commonly known as: VENTOLIN HFA Inhale 2 inhalations into the lungs every 4 (four) hours as needed for Wheezing   cholecalciferol 1000 units tablet Commonly known as: VITAMIN D Take 1,000 Units by mouth daily.   EPINEPHrine 0.3 mg/0.3 mL Soaj injection Commonly known as: EPI-PEN Inject 0.3 mg into the muscle as needed for anaphylaxis.   fexofenadine 180 MG tablet Commonly known as: ALLEGRA Allegra Allergy 180 mg tablet  Take 1 tablet every day by oral route.   fluticasone 50 MCG/ACT nasal spray Commonly known as: FLONASE Place 2 sprays into both nostrils once daily as needed   FreeStyle Libre 2 Reader Kerrin Mo USE AS DIRECTED   FreeStyle Libre 2 Sensor Misc Use as directed   imipramine 25 MG tablet Commonly known as: TOFRANIL Take 2 tablets (50 mg total) by mouth at bedtime   levothyroxine 100 MCG tablet Commonly known as: SYNTHROID Take 1 tablet (100 mcg total) by mouth once daily   lovastatin 20 MG tablet Commonly known as: MEVACOR Take 1 tablet (20  mg total) by mouth daily with dinner   metoprolol succinate 50 MG 24 hr tablet Commonly known as: TOPROL-XL Take 1 tablet (50 mg total) by mouth once daily   montelukast 10 MG tablet Commonly known as: SINGULAIR Take 1 tablet (10 mg total) by mouth at bedtime   Multi-Vitamins Tabs Take by mouth.   nitrofurantoin (macrocrystal-monohydrate) 100 MG capsule Commonly known as: MACROBID Take 1 capsule (100 mg total) by mouth every 12 (twelve) hours. Started by: Zara Council, PA-C        Allergies:  Allergies  Allergen Reactions   Ciprofloxacin Anaphylaxis   Floxin [Ofloxacin] Anaphylaxis   Levofloxacin Anaphylaxis   Quinolones Anaphylaxis   Amoxicillin     fever   Bextra [Valdecoxib]    Gyne-Lotrimin [  Clotrimazole]     redness   Penicillins Rash   Septra [Sulfamethoxazole-Trimethoprim] Rash    Flushed and fever.   Sulfa Antibiotics Rash    fever    Family History: Family History  Problem Relation Age of Onset   Cancer Mother    Hyperlipidemia Mother    Heart disease Mother    Hypertension Mother    Breast cancer Mother 33   Kidney failure Mother    Stroke Mother    Hyperlipidemia Father    Heart disease Father    Hypertension Father    Cancer Father        colon   Breast cancer Paternal Aunt 8   Breast cancer Maternal Aunt 85   Kidney cancer Neg Hx    Bladder Cancer Neg Hx    Prostate cancer Neg Hx    Ovarian cancer Neg Hx    Diabetes Neg Hx     Social History:  reports that she has never smoked. She has never been exposed to tobacco smoke. She has never used smokeless tobacco. She reports that she does not drink alcohol and does not use drugs.  ROS: For pertinent review of systems please refer to history of present illness  Physical Exam: BP 139/85   Pulse 88   Ht 5' 2" (1.575 m)   Wt 132 lb (59.9 kg)   LMP 07/01/2014 (Exact Date)   BMI 24.14 kg/m   Constitutional:  Well nourished. Alert and oriented, No acute distress. HEENT: [place] AT, moist  mucus membranes.  Trachea midline Cardiovascular: No clubbing, cyanosis, or edema. Respiratory: Normal respiratory effort, no increased work of breathing. Neurologic: Grossly intact, no focal deficits, moving all 4 extremities. Psychiatric: Normal mood and affect.    Laboratory Data: Glucose 70 - 110 mg/dL 89   Sodium 136 - 145 mmol/L 141   Potassium 3.6 - 5.1 mmol/L 4.6   Chloride 97 - 109 mmol/L 104   Carbon Dioxide (CO2) 22.0 - 32.0 mmol/L 32.1 High    Urea Nitrogen (BUN) 7 - 25 mg/dL 13   Creatinine 0.6 - 1.1 mg/dL 0.8   Glomerular Filtration Rate (eGFR) >60 mL/min/1.73sq m 73   Calcium 8.7 - 10.3 mg/dL 9.6   AST  8 - 39 U/L 21   ALT  5 - 38 U/L 24   Alk Phos (alkaline Phosphatase) 34 - 104 U/L 67   Albumin 3.5 - 4.8 g/dL 4.4   Bilirubin, Total 0.3 - 1.2 mg/dL 0.7   Protein, Total 6.1 - 7.9 g/dL 6.5   A/G Ratio 1.0 - 5.0 gm/dL 2.1   Resulting Agency  [facility] - LAB   Specimen Collected: 05/07/22 07:34   Performed by: [place]: 05/07/22 10:13  Received From: [facility]  Result Received: 06/15/22 09:42   Cholesterol, Total 100 - 200 mg/dL 179   Triglyceride 35 - 199 mg/dL 82   HDL (High Density Lipoprotein) Cholesterol 35.0 - 85.0 mg/dL 49.7   LDL Calculated 0 - 130 mg/dL 113   VLDL Cholesterol mg/dL 16   Cholesterol/HDL Ratio  3.6   Resulting Agency  [facility] - LAB   Specimen Collected: 05/07/22 07:34   Performed by: [place]: 05/07/22 10:13  Received From: [facility]  Result Received: 06/15/22 09:42   Hemoglobin A1C 4.2 - 5.6 % 6.1 High    Average Blood Glucose (Calc) mg/dL 128   Resulting Agency  [facility] - [facility]  Narrative Performed by [facility] - [facility] Normal Range:    4.2 - 5.6%  Increased Risk:  5.7 - 6.4%  Diabetes:        >= 6.5%  Glycemic Control for adults with diabetes:  <7%    Specimen Collected: 05/07/22  07:34   Performed by: [place]: 05/07/22 09:52  Received From: [facility]  Result Received: 06/15/22 09:42   WBC (White Blood Cell Count) 4.1 - 10.2 10^3/uL 5.6   RBC (Red Blood Cell Count) 4.04 - 5.48 10^6/uL 5.06   Hemoglobin 12.0 - 15.0 gm/dL 14.2   Hematocrit 35.0 - 47.0 % 42.7   MCV (Mean Corpuscular Volume) 80.0 - 100.0 fl 84.4   MCH (Mean Corpuscular Hemoglobin) 27.0 - 31.2 pg 28.1   MCHC (Mean Corpuscular Hemoglobin Concentration) 32.0 - 36.0 gm/dL 33.3   Platelet Count 150 - 450 10^3/uL 279   RDW-CV (Red Cell Distribution Width) 11.6 - 14.8 % 12.7   MPV (Mean Platelet Volume) 9.4 - 12.4 fl 9.7   Neutrophils 1.50 - 7.80 10^3/uL 3.10   Lymphocytes 1.00 - 3.60 10^3/uL 1.87   Monocytes 0.00 - 1.50 10^3/uL 0.42   Eosinophils 0.00 - 0.55 10^3/uL 0.12   Basophils 0.00 - 0.09 10^3/uL 0.04   Neutrophil % 32.0 - 70.0 % 55.6   Lymphocyte % 10.0 - 50.0 % 33.6   Monocyte % 4.0 - 13.0 % 7.5   Eosinophil % 1.0 - 5.0 % 2.2   Basophil% 0.0 - 2.0 % 0.7   Immature Granulocyte % <=0.7 % 0.4   Immature Granulocyte Count <=0.06 10^3/L 0.02   Resulting Agency  [facility] - LAB   Specimen Collected: 05/07/22 07:34   Performed by: [place]: 05/07/22 08:31  Received From: [facility]  Result Received: 06/15/22 09:42   Urinalysis Results for orders placed or performed in visit on 06/23/22  Microscopic Examination   Urine  Result Value Ref Range   WBC, UA 0-5 0 - 5 /hpf   RBC, Urine 0-2 0 - 2 /hpf   Epithelial Cells (non renal) 0-10 0 - 10 /hpf   Bacteria, UA Few None seen/Few  Urinalysis, Complete  Result Value Ref Range   Specific Gravity, UA 1.010 1.005 - 1.030   pH, UA 6.0 5.0 - 7.5   Color, UA Yellow Yellow   Appearance Ur Clear Clear   Leukocytes,UA Negative Negative   Protein,UA Negative Negative/Trace   Glucose, UA Negative Negative   Ketones, UA Negative Negative    RBC, UA Negative Negative   Bilirubin, UA Negative Negative   Urobilinogen, Ur 0.2 0.2 - 1.0 mg/dL   Nitrite, UA Negative Negative   Microscopic Examination See below:   Bladder Scan (Post Void Residual) in office  Result Value Ref Range   Scan Result 101m   I have reviewed the labs.    Pertinent Imaging:  06/23/22 13:40  Scan Result 086m   Assessment & Plan:    1. rUTI's -UA unremarkable -urine sent for culture  -continue Macrobid 100 mg, twice daily for seven days or until culture results return and antibiotic needs to be adjusted -She has had greater than 4 UTIs over the last year and with most of these not cultured, just self treated at home, and has failed 3 months of prophylactic antibiotics -She is symptomatic at this visit -Since a majority of her symptomatic  episodes were treated w/o an urinalysis or culture, will schedule a cystoscopy to rule out any anatomical abnormalities that are contributing to her symptoms -I have explained to the patient that they will  be scheduled for a cystoscopy in our office to evaluate their bladder.  The cystoscopy consists of passing a tube with a lens up through their urethra and into their urinary bladder.   We will inject the urethra with a lidocaine gel prior to introducing the cystoscope to help with any discomfort during the procedure.   After the procedure, they might experience blood in the urine and discomfort with urination.  This will abate after the first few voids.  I have  encouraged the patient to increase water intake  during this time.  Patient denies any allergies to lidocaine.    2. Vaginal atrophy Cannot use vaginal estrogen cream due to endometrial bleeding  3. Cystocele Not bothersome                                  Return for Cystoscopy for presumed recurrent UTIs.  These notes generated with voice recognition software. I apologize for typographical errors.  [place], [place] [address] [place], [place] [address] [phone]

## 2022-06-24 ENCOUNTER — Encounter: Payer: Self-pay | Admitting: Urology

## 2022-06-28 ENCOUNTER — Other Ambulatory Visit: Payer: Self-pay | Admitting: Urology

## 2022-06-28 ENCOUNTER — Other Ambulatory Visit: Payer: Self-pay

## 2022-06-28 ENCOUNTER — Telehealth: Payer: Self-pay | Admitting: Family Medicine

## 2022-06-28 DIAGNOSIS — N390 Urinary tract infection, site not specified: Secondary | ICD-10-CM

## 2022-06-28 LAB — CULTURE, URINE COMPREHENSIVE

## 2022-06-28 MED ORDER — NITROFURANTOIN MONOHYD MACRO 100 MG PO CAPS
100.0000 mg | ORAL_CAPSULE | Freq: Every day | ORAL | 0 refills | Status: DC
Start: 2022-06-28 — End: 2022-07-07
  Filled 2022-06-28: qty 90, 90d supply, fill #0

## 2022-06-28 NOTE — Telephone Encounter (Signed)
-----   Message from Nori Riis, PA-C sent at 06/28/2022 12:51 PM EST ----- Please let [name] know that her urine culture was positive for infection and to continue the Macrobid for twice daily until she completes seven days and then take one daily until her cysto.  I have sent a new script to the [facility].

## 2022-06-28 NOTE — Telephone Encounter (Signed)
LMOM informed patient of results and new ABX was sent to pharmacy.

## 2022-06-29 ENCOUNTER — Other Ambulatory Visit: Payer: Self-pay

## 2022-07-05 ENCOUNTER — Other Ambulatory Visit: Payer: Self-pay

## 2022-07-07 ENCOUNTER — Other Ambulatory Visit: Payer: Self-pay

## 2022-07-07 ENCOUNTER — Ambulatory Visit (INDEPENDENT_AMBULATORY_CARE_PROVIDER_SITE_OTHER): Payer: 59 | Admitting: Urology

## 2022-07-07 ENCOUNTER — Encounter: Payer: Self-pay | Admitting: Urology

## 2022-07-07 VITALS — BP 150/82 | HR 89 | Ht 62.0 in | Wt 130.0 lb

## 2022-07-07 DIAGNOSIS — N390 Urinary tract infection, site not specified: Secondary | ICD-10-CM | POA: Diagnosis not present

## 2022-07-07 DIAGNOSIS — Z87440 Personal history of urinary (tract) infections: Secondary | ICD-10-CM | POA: Diagnosis not present

## 2022-07-07 LAB — URINALYSIS, COMPLETE
Bilirubin, UA: NEGATIVE
Glucose, UA: NEGATIVE
Ketones, UA: NEGATIVE
Leukocytes,UA: NEGATIVE
Nitrite, UA: NEGATIVE
Protein,UA: NEGATIVE
RBC, UA: NEGATIVE
Specific Gravity, UA: 1.01 (ref 1.005–1.030)
Urobilinogen, Ur: 0.2 mg/dL (ref 0.2–1.0)
pH, UA: 6.5 (ref 5.0–7.5)

## 2022-07-07 LAB — MICROSCOPIC EXAMINATION: Bacteria, UA: NONE SEEN

## 2022-07-07 MED ORDER — NITROFURANTOIN MACROCRYSTAL 50 MG PO CAPS
50.0000 mg | ORAL_CAPSULE | Freq: Every day | ORAL | 1 refills | Status: DC
  Filled 2022-07-07: qty 90, 90d supply, fill #0
  Filled 2022-10-11: qty 30, 30d supply, fill #1
  Filled 2022-11-07: qty 60, 60d supply, fill #2

## 2022-07-07 NOTE — Progress Notes (Signed)
   07/07/22  CC:  Chief Complaint  Patient presents with   Cysto    HPI: Refer to [facility] note 06/23/2022  Last menstrual period 07/01/2014. NED. A&Ox3.   No respiratory distress   Abd soft, NT, ND Normal external genitalia with patent urethral meatus  Cystoscopy Procedure Note  Patient identification was confirmed, informed consent was obtained, and patient was prepped using Betadine solution.  Lidocaine jelly was administered per urethral meatus.    Procedure: - Flexible cystoscope introduced, without any difficulty.   - Thorough search of the bladder revealed:    normal urethral meatus    normal urothelium    no stones    no ulcers     no tumors    no urethral polyps    no trabeculation  - Ureteral orifices were normal in position and appearance.  Post-Procedure: - Patient tolerated the procedure well  Assessment/ Plan: No bladder mucosal abnormalities on cystoscopy Recently failed short-term trial low-dose prophylaxis She has been on long-term prophylaxis previously with good results.  When she came off she was infection free for 1 year then began to have recurrent infections. She was on Macrodantin 50 mg daily which she tolerated well.  She is interested in restarting.  We discussed possibility of pulmonary fibrosis with Macrodantin though uncommon.  Rx sent to pharmacy Follow-up appointment with [facility] 6 months   Abbie Sons, MD

## 2022-07-08 ENCOUNTER — Other Ambulatory Visit: Payer: Self-pay

## 2022-07-09 DIAGNOSIS — Z8639 Personal history of other endocrine, nutritional and metabolic disease: Secondary | ICD-10-CM | POA: Diagnosis not present

## 2022-07-12 DIAGNOSIS — H43813 Vitreous degeneration, bilateral: Secondary | ICD-10-CM | POA: Diagnosis not present

## 2022-07-12 DIAGNOSIS — M3501 Sicca syndrome with keratoconjunctivitis: Secondary | ICD-10-CM | POA: Diagnosis not present

## 2022-07-12 DIAGNOSIS — Z961 Presence of intraocular lens: Secondary | ICD-10-CM | POA: Diagnosis not present

## 2022-07-14 DIAGNOSIS — M159 Polyosteoarthritis, unspecified: Secondary | ICD-10-CM | POA: Diagnosis not present

## 2022-07-19 ENCOUNTER — Other Ambulatory Visit: Payer: Self-pay

## 2022-07-19 MED ORDER — MONTELUKAST SODIUM 10 MG PO TABS
10.0000 mg | ORAL_TABLET | Freq: Every day | ORAL | 1 refills | Status: DC
  Filled 2022-07-19: qty 90, 90d supply, fill #0
  Filled 2022-10-15: qty 90, 90d supply, fill #1

## 2022-07-19 MED ORDER — LEVOTHYROXINE SODIUM 100 MCG PO TABS
ORAL_TABLET | ORAL | 1 refills | Status: DC
  Filled 2022-07-19: qty 90, 90d supply, fill #0
  Filled 2022-10-11: qty 30, 30d supply, fill #1
  Filled 2022-11-24: qty 30, 30d supply, fill #2
  Filled 2022-11-24: qty 60, 60d supply, fill #2

## 2022-07-20 ENCOUNTER — Ambulatory Visit: Payer: 59 | Admitting: Obstetrics and Gynecology

## 2022-07-21 ENCOUNTER — Ambulatory Visit
Admission: RE | Admit: 2022-07-21 | Discharge: 2022-07-21 | Disposition: A | Discharge status: Home / Self Care | Payer: 59 | Source: Ambulatory Visit | Attending: Obstetrics and Gynecology | Admitting: Obstetrics and Gynecology

## 2022-07-21 DIAGNOSIS — Z1231 Encounter for screening mammogram for malignant neoplasm of breast: Secondary | ICD-10-CM | POA: Diagnosis not present

## 2022-07-22 ENCOUNTER — Other Ambulatory Visit: Payer: Self-pay | Admitting: Obstetrics and Gynecology

## 2022-07-22 DIAGNOSIS — N63 Unspecified lump in unspecified breast: Secondary | ICD-10-CM

## 2022-07-22 DIAGNOSIS — R928 Other abnormal and inconclusive findings on diagnostic imaging of breast: Secondary | ICD-10-CM

## 2022-07-30 NOTE — Progress Notes (Unsigned)
ANNUAL PREVENTATIVE CARE GYNECOLOGY  ENCOUNTER NOTE  Subjective:       [name] is a 61 y.o. G61P2002 female here for a routine annual gynecologic exam. The patient {is/is not/has never been:13135} sexually active. The patient {is/is not:13135} taking hormone replacement therapy. {post-men bleed:13152::"Patient denies post-menopausal vaginal bleeding."} The patient wears seatbelts: {yes/no:311178}. The patient participates in regular exercise: {yes/no/not asked:9010}. Has the patient ever been transfused or tattooed?: {yes/no/not asked:9010}. The patient reports that there {is/is not:9024} domestic violence in her life.  Current complaints: 1.  ***    Gynecologic History Patient's last menstrual period was 07/01/2014 (exact date). Contraception: post menopausal status Last Pap: 05/07/2020. Results were: normal Last mammogram: 07/21/2022. Results were: : 0: Incomplete. Need additional imaging evaluation and/or prior mammograms for comparison. Last Colonoscopy: 07/10/2018 Last Dexa Scan:    Obstetric History OB History  Gravida Para Term Preterm AB Living  '2 2 2     2  '$ SAB IAB Ectopic Multiple Live Births          2    # Outcome Date GA Lbr Len/2nd Weight Sex Delivery Anes PTL Lv  2 Term 05/22/95    F Vag-Spont   LIV  1 Term 03/22/91    M Vag-Spont   LIV    Past Medical History:  Diagnosis Date   Allergy    Arthritis    osteoarthritis - fingers   Asthma    Chicken pox    Chronic UTI    Cystocele    2nd degree   Endometriosis    Family history of polyps in the colon    Graves disease    History of heavy periods    Hole in the ear drum, right    Hyperlipemia    IBS (irritable bowel syndrome)    Perimenopausal    PONV (postoperative nausea and vomiting)    nausea only   Rectocele    mild   S/P endometrial ablation    Scoliosis    Skin cancer    SUI (stress urinary incontinence, female)    SVT (supraventricular tachycardia)    Urine incontinence     Wears contact lenses    Wears hearing aid in both ears     Family History  Problem Relation Age of Onset   Cancer Mother    Hyperlipidemia Mother    Heart disease Mother    Hypertension Mother    Breast cancer Mother 81   Kidney failure Mother    Stroke Mother    Hyperlipidemia Father    Heart disease Father    Hypertension Father    Cancer Father        colon   Breast cancer Paternal Aunt 7   Breast cancer Maternal Aunt 85   Kidney cancer Neg Hx    Bladder Cancer Neg Hx    Prostate cancer Neg Hx    Ovarian cancer Neg Hx    Diabetes Neg Hx     Past Surgical History:  Procedure Laterality Date   CATARACT EXTRACTION W/PHACO Right 11/03/2021   Procedure: CATARACT EXTRACTION PHACO AND INTRAOCULAR LENS PLACEMENT ([place]) RIGHT;  Surgeon: Birder Robson, MD;  Location: [facility];  Service: Ophthalmology;  Laterality: Right;  2.98 0:27.0   CATARACT EXTRACTION W/PHACO Left 12/22/2021   Procedure: CATARACT EXTRACTION PHACO AND INTRAOCULAR LENS PLACEMENT ([facility]) LEFT 2.38 00:26.0;  Surgeon: Birder Robson, MD;  Location: [facility];  Service: Ophthalmology;  Laterality: Left;   COLONOSCOPY WITH PROPOFOL N/A 07/10/2018  Procedure: COLONOSCOPY WITH PROPOFOL;  Surgeon: Manya Silvas, MD;  Location: [facility] ENDOSCOPY;  Service: Endoscopy;  Laterality: N/A;   endometrrial ablation     EYE SURGERY     knee athroscopy Left 2013   laprascopic surgery     endometreosis   NASOPHARYNGOSCOPY EUSTATION TUBE BALLOON DILATION Bilateral 05/18/2018   Procedure: NASOPHARYNGOSCOPY EUSTATION TUBE BALLOON DILATION OUTFRACTURE BILATERAL INFERIOR TURBINATES;  Surgeon: Margaretha Sheffield, MD;  Location: [facility];  Service: ENT;  Laterality: Bilateral;   NASOPHARYNGOSCOPY EUSTATION TUBE BALLOON DILATION     nova-sure     THYROIDECTOMY, PARTIAL     TONSILLECTOMY AND ADENOIDECTOMY     tubes in ears      Social History   Socioeconomic History   Marital status: Married     Spouse name: Not on file   Number of children: Not on file   Years of education: Not on file   Highest education level: Not on file  Occupational History   Occupation: [employer]  Tobacco Use   Smoking status: Never    Passive exposure: Never   Smokeless tobacco: Never  Vaping Use   Vaping Use: Never used  Substance and Sexual Activity   Alcohol use: No    Alcohol/week: 0.0 standard drinks of alcohol   Drug use: No   Sexual activity: Yes    Birth control/protection: Post-menopausal  Other Topics Concern   Not on file  Social History Narrative   Not on file   Social Determinants of Health   Financial Resource Strain: Not on file  Food Insecurity: Not on file  Transportation Needs: Not on file  Physical Activity: Sufficiently Active (04/19/2018)   Exercise Vital Sign    Days of Exercise per Week: 4 days    Minutes of Exercise per Session: 40 min  Stress: Not on file  Social Connections: Not on file  Intimate Partner Violence: Not on file    Current Outpatient Medications on File Prior to Visit  Medication Sig Dispense Refill   albuterol (VENTOLIN HFA) 108 (90 Base) MCG/ACT inhaler Inhale 2 inhalations into the lungs every 4 (four) hours as needed for Wheezing 18 g 1   cholecalciferol (VITAMIN D) 1000 UNITS tablet Take 1,000 Units by mouth daily.     Continuous Blood Gluc Receiver (FREESTYLE LIBRE 2 READER) DEVI USE AS DIRECTED 1 each 0   Continuous Blood Gluc Sensor (FREESTYLE LIBRE 2 SENSOR) MISC Use as directed 2 each 3   EPINEPHrine 0.3 mg/0.3 mL IJ SOAJ injection Inject 0.3 mg into the muscle as needed for anaphylaxis.     fexofenadine (ALLEGRA) 180 MG tablet Allegra Allergy 180 mg tablet  Take 1 tablet every day by oral route.     fluticasone (FLONASE) 50 MCG/ACT nasal spray Place 2 sprays into both nostrils once daily as needed 48 g 1   imipramine (TOFRANIL) 25 MG tablet Take 2 tablets (50 mg total) by mouth at bedtime 180 tablet 1   levothyroxine (SYNTHROID)  100 MCG tablet Take 1 tablet (100 mcg total) by mouth once daily 90 tablet 1   lovastatin (MEVACOR) 20 MG tablet Take 1 tablet (20 mg total) by mouth daily with dinner 90 tablet 1   metoprolol succinate (TOPROL-XL) 50 MG 24 hr tablet Take 1 tablet (50 mg total) by mouth once daily 90 tablet 3   montelukast (SINGULAIR) 10 MG tablet Take 1 tablet (10 mg total) by mouth at bedtime 90 tablet 1   Multiple Vitamin (MULTI-VITAMINS) TABS Take by  mouth.     nitrofurantoin (MACRODANTIN) 50 MG capsule Take 1 capsule (50 mg total) by mouth daily. 90 capsule 1   No current facility-administered medications on file prior to visit.    Allergies  Allergen Reactions   Ciprofloxacin Anaphylaxis   Floxin [Ofloxacin] Anaphylaxis   Levofloxacin Anaphylaxis   Quinolones Anaphylaxis   Amoxicillin     fever   Bextra [Valdecoxib]    Gyne-Lotrimin [Clotrimazole]     redness   Penicillins Rash   Septra [Sulfamethoxazole-Trimethoprim] Rash    Flushed and fever.   Sulfa Antibiotics Rash    fever      Review of Systems ROS Review of Systems - General ROS: negative for - chills, fatigue, fever, hot flashes, night sweats, weight gain or weight loss Psychological ROS: negative for - anxiety, decreased libido, depression, mood swings, physical abuse or sexual abuse Ophthalmic ROS: negative for - blurry vision, eye pain or loss of vision ENT ROS: negative for - headaches, hearing change, visual changes or vocal changes Allergy and Immunology ROS: negative for - hives, itchy/watery eyes or seasonal allergies Hematological and Lymphatic ROS: negative for - bleeding problems, bruising, swollen lymph nodes or weight loss Endocrine ROS: negative for - galactorrhea, hair pattern changes, hot flashes, malaise/lethargy, mood swings, palpitations, polydipsia/polyuria, skin changes, temperature intolerance or unexpected weight changes Breast ROS: negative for - new or changing breast lumps or nipple discharge Respiratory  ROS: negative for - cough or shortness of breath Cardiovascular ROS: negative for - chest pain, irregular heartbeat, palpitations or shortness of breath Gastrointestinal ROS: no abdominal pain, change in bowel habits, or black or bloody stools Genito-Urinary ROS: no dysuria, trouble voiding, or hematuria Musculoskeletal ROS: negative for - joint pain or joint stiffness Neurological ROS: negative for - bowel and bladder control changes Dermatological ROS: negative for rash and skin lesion changes   Objective:   LMP 07/01/2014 (Exact Date)  CONSTITUTIONAL: Well-developed, well-nourished female in no acute distress.  PSYCHIATRIC: Normal mood and affect. Normal behavior. Normal judgment and thought content. [place]: Alert and oriented to person, place, and time. Normal muscle tone coordination. No cranial nerve deficit noted. HENT:  Normocephalic, atraumatic, External right and left ear normal. Oropharynx is clear and moist EYES: Conjunctivae and EOM are normal. Pupils are equal, round, and reactive to light. No scleral icterus.  NECK: Normal range of motion, supple, no masses.  Normal thyroid.  SKIN: Skin is warm and dry. No rash noted. Not diaphoretic. No erythema. No pallor. CARDIOVASCULAR: Normal heart rate noted, regular rhythm, no murmur. RESPIRATORY: Clear to auscultation bilaterally. Effort and breath sounds normal, no problems with respiration noted. BREASTS: Symmetric in size. No masses, skin changes, nipple drainage, or lymphadenopathy. ABDOMEN: Soft, normal bowel sounds, no distention noted.  No tenderness, rebound or guarding.  BLADDER: Normal PELVIC:  Bladder {:311640}  Urethra: {:311719}  Vulva: {:311722}  Vagina: {:311643}  Cervix: {:311644}  Uterus: {:311718}  Adnexa: {:311645}  RV: {Blank multiple:19196::"External Exam NormaI","No Rectal Masses","Normal Sphincter tone"}  MUSCULOSKELETAL: Normal range of motion. No tenderness.  No cyanosis, clubbing, or edema.  2+  distal pulses. LYMPHATIC: No Axillary, Supraclavicular, or Inguinal Adenopathy.   Labs: Lab Results  Component Value Date   WBC 6.3 06/09/2012   HGB 13.9 06/09/2012   HCT 40.5 06/09/2012   MCV 85 06/09/2012   PLT 241 06/09/2012    Lab Results  Component Value Date   CREATININE 0.61 06/09/2012   BUN 12 06/09/2012   NA 141 06/09/2012   K 4.4 06/09/2012  CL 105 06/09/2012   CO2 29 06/09/2012    Lab Results  Component Value Date   ALT 26 06/09/2012   AST 13 (L) 06/09/2012   ALKPHOS 68 06/09/2012   BILITOT 0.6 06/09/2012    Lab Results  Component Value Date   CHOL 187 06/09/2012   HDL 49 06/09/2012   LDLCALC 127 (H) 06/09/2012   TRIG 53 06/09/2012    Lab Results  Component Value Date   TSH 0.863 06/09/2012    No results found for: "HGBA1C"   Assessment:   No diagnosis found.   Plan:  Pap:  UTD Mammogram: Ordered Colon Screening:   UTD Labs: {Blank multiple:19196::"Lipid 1","FBS","TSH","Hemoglobin A1C","Vit D Level""***"} Routine preventative health maintenance measures emphasized: Exercise/Diet/Weight control, Tobacco Warnings, Alcohol/Substance use risks, Stress Management, Peer Pressure Issues, and Safe Sex COVID Vaccination status: Return to [place], MD [place]

## 2022-08-02 ENCOUNTER — Other Ambulatory Visit: Payer: Self-pay

## 2022-08-03 ENCOUNTER — Ambulatory Visit (INDEPENDENT_AMBULATORY_CARE_PROVIDER_SITE_OTHER): Payer: 59 | Admitting: Obstetrics and Gynecology

## 2022-08-03 ENCOUNTER — Encounter: Payer: Self-pay | Admitting: Obstetrics and Gynecology

## 2022-08-03 VITALS — BP 123/79 | HR 96 | Resp 16 | Ht 62.0 in | Wt 131.2 lb

## 2022-08-03 DIAGNOSIS — Z87440 Personal history of urinary (tract) infections: Secondary | ICD-10-CM

## 2022-08-03 DIAGNOSIS — E0500 Thyrotoxicosis with diffuse goiter without thyrotoxic crisis or storm: Secondary | ICD-10-CM | POA: Diagnosis not present

## 2022-08-03 DIAGNOSIS — Z01419 Encounter for gynecological examination (general) (routine) without abnormal findings: Secondary | ICD-10-CM

## 2022-08-03 DIAGNOSIS — R7303 Prediabetes: Secondary | ICD-10-CM | POA: Diagnosis not present

## 2022-08-03 DIAGNOSIS — N814 Uterovaginal prolapse, unspecified: Secondary | ICD-10-CM

## 2022-08-03 DIAGNOSIS — E039 Hypothyroidism, unspecified: Secondary | ICD-10-CM | POA: Diagnosis not present

## 2022-08-03 DIAGNOSIS — Z809 Family history of malignant neoplasm, unspecified: Secondary | ICD-10-CM

## 2022-08-03 DIAGNOSIS — Z803 Family history of malignant neoplasm of breast: Secondary | ICD-10-CM | POA: Diagnosis not present

## 2022-08-03 DIAGNOSIS — Z13220 Encounter for screening for lipoid disorders: Secondary | ICD-10-CM

## 2022-08-03 NOTE — Patient Instructions (Signed)

## 2022-08-11 ENCOUNTER — Ambulatory Visit
Admission: RE | Admit: 2022-08-11 | Discharge: 2022-08-11 | Disposition: A | Discharge status: Home / Self Care | Payer: 59 | Source: Ambulatory Visit | Attending: Obstetrics and Gynecology | Admitting: Obstetrics and Gynecology

## 2022-08-11 DIAGNOSIS — R928 Other abnormal and inconclusive findings on diagnostic imaging of breast: Secondary | ICD-10-CM | POA: Diagnosis not present

## 2022-08-11 DIAGNOSIS — N63 Unspecified lump in unspecified breast: Secondary | ICD-10-CM | POA: Diagnosis not present

## 2022-08-11 DIAGNOSIS — N6002 Solitary cyst of left breast: Secondary | ICD-10-CM | POA: Diagnosis not present

## 2022-08-29 ENCOUNTER — Ambulatory Visit
Admission: EM | Admit: 2022-08-29 | Discharge: 2022-08-29 | Disposition: A | Discharge status: Home / Self Care | Payer: 59 | Attending: Physician Assistant | Admitting: Physician Assistant

## 2022-08-29 ENCOUNTER — Encounter: Payer: Self-pay | Admitting: Emergency Medicine

## 2022-08-29 DIAGNOSIS — J069 Acute upper respiratory infection, unspecified: Secondary | ICD-10-CM

## 2022-08-29 DIAGNOSIS — Z1152 Encounter for screening for COVID-19: Secondary | ICD-10-CM | POA: Diagnosis not present

## 2022-08-29 DIAGNOSIS — R051 Acute cough: Secondary | ICD-10-CM | POA: Insufficient documentation

## 2022-08-29 DIAGNOSIS — R0981 Nasal congestion: Secondary | ICD-10-CM | POA: Insufficient documentation

## 2022-08-29 LAB — RESP PANEL BY RT-PCR (RSV, FLU A&B, COVID)  RVPGX2
Influenza A by PCR: NEGATIVE
Influenza B by PCR: NEGATIVE
Resp Syncytial Virus by PCR: NEGATIVE
SARS Coronavirus 2 by RT PCR: NEGATIVE

## 2022-08-29 NOTE — Discharge Instructions (Signed)
URI/COLD SYMPTOMS: Your exam today is consistent with a viral illness. Antibiotics are not indicated at this time. Use medications as directed, including cough syrup, nasal saline, and decongestants. Your symptoms should improve over the next few days and resolve within 7-10 days. Increase rest and fluids. F/u if symptoms worsen or predominate such as sore throat, ear pain, productive cough, shortness of breath, or if you develop high fevers or worsening fatigue over the next several days.    

## 2022-08-29 NOTE — ED Provider Notes (Signed)
[facility]    CSN: [id number] Arrival date & time: 08/29/22  1319      History   Chief Complaint Chief Complaint  Patient presents with   Nasal Congestion   Sinus Problem    HPI [name] is a 62 y.o. female presenting for 2-day history of fatigue, nasal congestion, sore throat, postnasal drainage, cough.  Patient says some of her nasal drainage is blood-tinged.  She believes she may have a sinus infection.  She has not had any fevers.  No wheezing or difficulty.  Does have history of asthma.  Taking OTC Mucinex and using nasal sprays.  HPI  Past Medical History:  Diagnosis Date   Allergy    Arthritis    osteoarthritis - fingers   Asthma    Chicken pox    Chronic UTI    Cystocele    2nd degree   Endometriosis    Family history of polyps in the colon    Graves disease    History of heavy periods    Hole in the ear drum, right    Hyperlipemia    IBS (irritable bowel syndrome)    Perimenopausal    PONV (postoperative nausea and vomiting)    nausea only   Rectocele    mild   S/P endometrial ablation    Scoliosis    Skin cancer    SUI (stress urinary incontinence, female)    SVT (supraventricular tachycardia)    Urine incontinence    Wears contact lenses    Wears hearing aid in both ears     Patient Active Problem List   Diagnosis Date Noted   Digital mucous cyst 07/12/2018   History of Graves' disease 06/14/2018   Pure hypercholesterolemia 06/14/2018   Rhinitis 06/14/2018   Rosacea 06/14/2018   Hx of adenomatous colonic polyps 05/09/2018   Midline cystocele 04/19/2018   Chest pain with low risk for cardiac etiology 10/26/2017   Borderline diabetes mellitus 11/05/2015   Chronic UTI 03/21/2015   SVT (supraventricular tachycardia) 03/21/2015   Endometriosis 03/21/2015   Asthma 03/21/2015   Graves disease 03/21/2015   Irritable bowel syndrome 03/21/2015   Family history of breast cancer 03/21/2015   Stool incontinence 03/21/2015    Gastrocnemius tear 01/18/2014   Rupture of patellar tendon 01/18/2014   Bladder infection, chronic 05/24/2012   Incomplete emptying of bladder 05/24/2012   Mixed urge and stress incontinence 05/24/2012    Past Surgical History:  Procedure Laterality Date   CATARACT EXTRACTION W/PHACO Right 11/03/2021   Procedure: CATARACT EXTRACTION PHACO AND INTRAOCULAR LENS PLACEMENT ([place]) RIGHT;  Surgeon: Birder Robson, MD;  Location: [facility];  Service: Ophthalmology;  Laterality: Right;  2.98 0:27.0   CATARACT EXTRACTION W/PHACO Left 12/22/2021   Procedure: CATARACT EXTRACTION PHACO AND INTRAOCULAR LENS PLACEMENT ([facility]) LEFT 2.38 00:26.0;  Surgeon: Birder Robson, MD;  Location: [place];  Service: Ophthalmology;  Laterality: Left;   COLONOSCOPY WITH PROPOFOL N/A 07/10/2018   Procedure: COLONOSCOPY WITH PROPOFOL;  Surgeon: Manya Silvas, MD;  Location: [facility] ENDOSCOPY;  Service: Endoscopy;  Laterality: N/A;   endometrrial ablation     EYE SURGERY     knee athroscopy Left 2013   laprascopic surgery     endometreosis   NASOPHARYNGOSCOPY EUSTATION TUBE BALLOON DILATION Bilateral 05/18/2018   Procedure: NASOPHARYNGOSCOPY EUSTATION TUBE BALLOON DILATION OUTFRACTURE BILATERAL INFERIOR TURBINATES;  Surgeon: Margaretha Sheffield, MD;  Location: [facility];  Service: ENT;  Laterality: Bilateral;   NASOPHARYNGOSCOPY EUSTATION TUBE BALLOON  DILATION     nova-sure     THYROIDECTOMY, PARTIAL     TONSILLECTOMY AND ADENOIDECTOMY     tubes in ears      OB History     Gravida  2   Para  2   Term  2   Preterm      AB      Living  2      SAB      IAB      Ectopic      Multiple      Live Births  2            Home Medications    Prior to Admission medications   Medication Sig Start Date End Date Taking? Authorizing Provider  albuterol (VENTOLIN HFA) 108 (90 Base) MCG/ACT inhaler Inhale 2 inhalations into the lungs every 4 (four) hours as needed for  Wheezing 05/21/21     cholecalciferol (VITAMIN D) 1000 UNITS tablet Take 1,000 Units by mouth daily.    [provider]  Continuous Blood Gluc Sensor (FREESTYLE LIBRE 2 SENSOR) MISC Use as directed 05/12/22     EPINEPHrine 0.3 mg/0.3 mL IJ SOAJ injection Inject 0.3 mg into the muscle as needed for anaphylaxis.    [provider]  fexofenadine (ALLEGRA) 180 MG tablet Allegra Allergy 180 mg tablet  Take 1 tablet every day by oral route.    [provider]  fluticasone (FLONASE) 50 MCG/ACT nasal spray Place 2 sprays into both nostrils once daily as needed 05/21/21     imipramine (TOFRANIL) 25 MG tablet Take 2 tablets (50 mg total) by mouth at bedtime 02/18/22     levothyroxine (SYNTHROID) 100 MCG tablet Take 1 tablet (100 mcg total) by mouth once daily 07/19/22     lovastatin (MEVACOR) 20 MG tablet Take 1 tablet (20 mg total) by mouth daily with dinner 04/15/22     metoprolol succinate (TOPROL-XL) 50 MG 24 hr tablet Take 1 tablet (50 mg total) by mouth once daily 12/15/21     montelukast (SINGULAIR) 10 MG tablet Take 1 tablet (10 mg total) by mouth at bedtime 07/19/22     Multiple Vitamin (MULTI-VITAMINS) TABS Take by mouth.    [provider]  nitrofurantoin (MACRODANTIN) 50 MG capsule Take 1 capsule (50 mg total) by mouth daily. 07/07/22   Stoioff, Ronda Fairly, MD    Family History Family History  Problem Relation Age of Onset   Cancer Mother    Hyperlipidemia Mother    Heart disease Mother    Hypertension Mother    Breast cancer Mother 96   Kidney failure Mother    Stroke Mother    Cancer Father        colon   Hyperlipidemia Father    Heart disease Father    Hypertension Father    Breast cancer Maternal Aunt 85   Breast cancer Paternal Aunt 53   Kidney cancer Neg Hx    Bladder Cancer Neg Hx    Prostate cancer Neg Hx    Ovarian cancer Neg Hx    Diabetes Neg Hx     Social History Social History   Tobacco Use   Smoking status: Never    Passive  exposure: Never   Smokeless tobacco: Never  Vaping Use   Vaping Use: Never used  Substance Use Topics   Alcohol use: No    Alcohol/week: 0.0 standard drinks of alcohol   Drug use: No     Allergies  Ciprofloxacin, Floxin [ofloxacin], Levofloxacin, Quinolones, Amoxicillin, Bextra [valdecoxib], Gyne-lotrimin [clotrimazole], Penicillins, Septra [sulfamethoxazole-trimethoprim], and Sulfa antibiotics   Review of Systems Review of Systems  Constitutional:  Positive for fatigue. Negative for chills, diaphoresis and fever.  HENT:  Positive for congestion, rhinorrhea and sinus pressure. Negative for ear pain and sore throat.   Respiratory:  Positive for cough. Negative for shortness of breath.   Gastrointestinal:  Negative for abdominal pain, nausea and vomiting.  Musculoskeletal:  Negative for myalgias.  Skin:  Negative for rash.  Neurological:  Negative for weakness and headaches.  Hematological:  Negative for adenopathy.     Physical Exam Triage Vital Signs ED Triage Vitals  Enc Vitals Group     BP      Pulse      Resp      Temp      Temp src      SpO2      Weight      Height      Head Circumference      Peak Flow      Pain Score      Pain Loc      Pain Edu?      Excl. in Center?    No data found.  Updated Vital Signs BP (!) 143/93 (BP Location: Right Arm)   Pulse (!) 118   Temp 99.6 F (37.6 C) (Oral)   Resp 14   Ht '5\' 2"'$  (1.575 m)   Wt 130 lb (59 kg)   LMP 07/01/2014 (Exact Date)   SpO2 99%   BMI 23.78 kg/m   Physical Exam Vitals and nursing note reviewed.  Constitutional:      General: She is not in acute distress.    Appearance: Normal appearance. She is not ill-appearing or toxic-appearing.  HENT:     Head: Normocephalic and atraumatic.     Right Ear: Ear canal and external ear normal. A middle ear effusion is present.     Left Ear: Ear canal and external ear normal. A middle ear effusion is present.     Nose: Congestion present.     Mouth/Throat:      Mouth: Mucous membranes are moist.     Pharynx: Oropharynx is clear. Posterior oropharyngeal erythema present.  Eyes:     General: No scleral icterus.       Right eye: No discharge.        Left eye: No discharge.     Conjunctiva/sclera: Conjunctivae normal.  Cardiovascular:     Rate and Rhythm: Regular rhythm. Tachycardia present.     Heart sounds: Normal heart sounds.  Pulmonary:     Effort: Pulmonary effort is normal. No respiratory distress.     Breath sounds: Normal breath sounds.  Musculoskeletal:     Cervical back: Neck supple.  Skin:    General: Skin is dry.  Neurological:     General: No focal deficit present.     Mental Status: She is alert. Mental status is at baseline.     Motor: No weakness.     Gait: Gait normal.  Psychiatric:        Mood and Affect: Mood normal.        Behavior: Behavior normal.        Thought Content: Thought content normal.      UC Treatments / Results  Labs (all labs ordered are listed, but only abnormal results are displayed) Labs Reviewed  RESP PANEL BY RT-PCR (RSV, FLU A&B, COVID)  [id number]  EKG   Radiology No results found.  Procedures Procedures (including critical care time)  Medications Ordered in UC Medications - No data to display  Initial Impression / Assessment and Plan / UC Course  I have reviewed the triage vital signs and the nursing notes.  Pertinent labs & imaging results that were available during my care of the patient were reviewed by me and considered in my medical decision making (see chart for details).   62 year old female presenting for fatigue, cough, congestion, sinus pressure x 2 days.  Pulse elevated at 118 bpm.  She is afebrile.  Temp is 99.6 degrees.  She is overall well-appearing.  She does have nasal congestion on exam as well as erythema posterior pharynx with clear postnasal drainage.  She does have effusion of bilateral TMs.  Chest clear to auscultation and heart regular  rhythm.  Respiratory panel obtained. All negative.  Result discussed patient.  Viral upper respiratory infection.  Offered cough medication nasal sprays but she declines and says she will use at home medications.  Explained to her that she should be feeling better in the next week or 2.  Reviewed return precautions.  She declines AVS and says she will look at her information on MyChart.   Final Clinical Impressions(s) / UC Diagnoses   Final diagnoses:  Viral upper respiratory tract infection  Acute cough  Nasal congestion     Discharge Instructions      URI/COLD SYMPTOMS: Your exam today is consistent with a viral illness. Antibiotics are not indicated at this time. Use medications as directed, including cough syrup, nasal saline, and decongestants. Your symptoms should improve over the next few days and resolve within 7-10 days. Increase rest and fluids. F/u if symptoms worsen or predominate such as sore throat, ear pain, productive cough, shortness of breath, or if you develop high fevers or worsening fatigue over the next several days.       ED Prescriptions   None    PDMP not reviewed this encounter.   Danton Clap, PA-C 08/29/22 1430

## 2022-08-29 NOTE — ED Triage Notes (Signed)
Patient c/o sinus congestion and pressure and nasal congestion and post nasal drip that started on Friday.  Patient reports yellow blood tinged drainage.  Patient denies fevers.

## 2022-08-31 ENCOUNTER — Other Ambulatory Visit: Payer: Self-pay

## 2022-08-31 MED ORDER — AZITHROMYCIN 250 MG PO TABS
ORAL_TABLET | ORAL | 0 refills | Status: DC
  Filled 2022-08-31: qty 6, 5d supply, fill #0

## 2022-09-12 ENCOUNTER — Other Ambulatory Visit: Payer: Self-pay

## 2022-09-13 ENCOUNTER — Other Ambulatory Visit: Payer: Self-pay

## 2022-09-14 ENCOUNTER — Other Ambulatory Visit: Payer: Self-pay

## 2022-09-14 MED ORDER — IMIPRAMINE HCL 25 MG PO TABS
50.0000 mg | ORAL_TABLET | Freq: Every evening | ORAL | 1 refills | Status: DC
  Filled 2022-09-14: qty 180, 90d supply, fill #0

## 2022-10-08 ENCOUNTER — Other Ambulatory Visit: Payer: Self-pay

## 2022-10-11 ENCOUNTER — Other Ambulatory Visit: Payer: Self-pay

## 2022-10-15 ENCOUNTER — Other Ambulatory Visit: Payer: Self-pay

## 2022-10-15 MED ORDER — LOVASTATIN 20 MG PO TABS
20.0000 mg | ORAL_TABLET | Freq: Every day | ORAL | 1 refills | Status: DC
  Filled 2022-10-15: qty 30, 30d supply, fill #0
  Filled 2022-11-07: qty 90, 90d supply, fill #1
  Filled 2023-02-06: qty 60, 60d supply, fill #2

## 2022-11-05 DIAGNOSIS — Z1329 Encounter for screening for other suspected endocrine disorder: Secondary | ICD-10-CM | POA: Diagnosis not present

## 2022-11-05 DIAGNOSIS — R634 Abnormal weight loss: Secondary | ICD-10-CM | POA: Diagnosis not present

## 2022-11-05 DIAGNOSIS — E7800 Pure hypercholesterolemia, unspecified: Secondary | ICD-10-CM | POA: Diagnosis not present

## 2022-11-05 DIAGNOSIS — R7303 Prediabetes: Secondary | ICD-10-CM | POA: Diagnosis not present

## 2022-11-05 DIAGNOSIS — I471 Supraventricular tachycardia, unspecified: Secondary | ICD-10-CM | POA: Diagnosis not present

## 2022-11-07 ENCOUNTER — Other Ambulatory Visit: Payer: Self-pay

## 2022-11-08 ENCOUNTER — Other Ambulatory Visit: Payer: Self-pay

## 2022-11-09 DIAGNOSIS — Z8639 Personal history of other endocrine, nutritional and metabolic disease: Secondary | ICD-10-CM | POA: Diagnosis not present

## 2022-11-09 DIAGNOSIS — R7303 Prediabetes: Secondary | ICD-10-CM | POA: Diagnosis not present

## 2022-11-09 DIAGNOSIS — Z0000 Encounter for general adult medical examination without abnormal findings: Secondary | ICD-10-CM | POA: Diagnosis not present

## 2022-11-09 DIAGNOSIS — E7800 Pure hypercholesterolemia, unspecified: Secondary | ICD-10-CM | POA: Diagnosis not present

## 2022-11-24 ENCOUNTER — Other Ambulatory Visit: Payer: Self-pay

## 2022-12-13 ENCOUNTER — Other Ambulatory Visit: Payer: Self-pay

## 2022-12-13 MED ORDER — IMIPRAMINE HCL 25 MG PO TABS
50.0000 mg | ORAL_TABLET | Freq: Every day | ORAL | 1 refills | Status: DC
  Filled 2022-12-13: qty 69, 34d supply, fill #0
  Filled 2022-12-13: qty 111, 56d supply, fill #0
  Filled 2023-03-10: qty 180, 90d supply, fill #1

## 2022-12-13 MED ORDER — METOPROLOL SUCCINATE ER 50 MG PO TB24
50.0000 mg | ORAL_TABLET | Freq: Every day | ORAL | 3 refills | Status: DC
  Filled 2022-12-13: qty 90, 90d supply, fill #0
  Filled 2023-03-10: qty 90, 90d supply, fill #1
  Filled 2023-05-26: qty 90, 90d supply, fill #2
  Filled 2023-09-06: qty 90, 90d supply, fill #3

## 2022-12-15 DIAGNOSIS — I471 Supraventricular tachycardia, unspecified: Secondary | ICD-10-CM | POA: Diagnosis not present

## 2022-12-15 DIAGNOSIS — R002 Palpitations: Secondary | ICD-10-CM | POA: Diagnosis not present

## 2023-01-04 NOTE — Progress Notes (Unsigned)
01/05/2023 2:54 PM   [name] Jan 07, 1961 [id number]  Referring provider: Marisue Ivan, MD [id number] [facility] [address] [facility] [place],  [place] [address]  Chief Complaint  Patient presents with   Follow-up    Urological history: 1. rUTI's -contributing factors of vaginal atrophy, age, incontinence, consumption of surgery drinks, constipation, diabetes and intercourse -cysto (06/2022) - NED  -Documented urine cultures over the last year  June 23, 2022 Enterococcus faecalis and lactobacillus species -taking cranberry tablets  2. SUI -contributing factors of vaginal atrophy, age, cystocele, pelvic floor laxity and diabetes  3. Cystocele -grade 1-2 cystocele   HPI: [name] is a 62 y.o. female who presents today for her yearly visit.  At her visit on 06/23/2022, she continues to struggle with recurrent UTIs.  She was on a course of 3 months of prophylactic Macrobid that ended in August, but then in September she started experiencing another UTI.  She has Macrobid on hand that she would use post-coitally and she would self treat with this antibiotic.  She has had to treat UTI's > 4 times this year.  Her symptoms of UTI consist of LBP, malodorous urine, frequency, urgency and dysuria.  Most recently, she started experiencing the symptoms on Sunday and started the nitrofurantoin.  She continues to have symptoms.  On the OAB questionnaire, she indicates that at baseline she has 1-7 daytime urinations, nocturia x1-2 and a mild urge to urinate.  Her urge to urinate is strong now, but she believes that is due to UTI.  She has urge incontinence 1-2 times weekly.  She wears panty liners daily.  Patient denies any modifying or aggravating factors.  Patient denies any gross hematuria or suprapubic/flank pain.  Patient denies any fevers, chills, nausea or vomiting.  UA yellow clear, specific gravity 1.010, pH 6.0, 0-5 WBCs, 0-2 RBCs, 0-10 epithelial cells and few  bacteria.  PVR 0 mL she was treated with Macrobid twice daily for 7 days and then placed on a Macrobid once daily.  She underwent a cysto on July 07, 2022 with Dr. Lonna Cobb.  It was normal.   She is having 1-7 daytime urinations, nocturia x 1-2.  With a mild urge to urinate.  She does have urge incontinence.  She leaks 1-2 times a week.  She wears panty liners daily.  She wears absorbent pads daily.  She does not limit fluid intake.  She does not engage in toilet mapping.  Patient denies any modifying or aggravating factors.  Patient denies any gross hematuria, dysuria or suprapubic/flank pain.  Patient denies any fevers, chills, nausea or vomiting.    She has had no issues with recurrent urinary tract infections that she has been on nitrofurantoin 50 mg daily.  She would like to continue the medication taking it 3 days weekly.  PMH: Past Medical History:  Diagnosis Date   Allergy    Arthritis    osteoarthritis - fingers   Asthma    Chicken pox    Chronic UTI    Cystocele    2nd degree   Endometriosis    Family history of polyps in the colon    Graves disease    History of heavy periods    Hole in the ear drum, right    Hyperlipemia    IBS (irritable bowel syndrome)    Perimenopausal    PONV (postoperative nausea and vomiting)    nausea only   Rectocele    mild   S/P endometrial  ablation    Scoliosis    Skin cancer    SUI (stress urinary incontinence, female)    SVT (supraventricular tachycardia)    Urine incontinence    Wears contact lenses    Wears hearing aid in both ears     Surgical History: Past Surgical History:  Procedure Laterality Date   CATARACT EXTRACTION W/PHACO Right 11/03/2021   Procedure: CATARACT EXTRACTION PHACO AND INTRAOCULAR LENS PLACEMENT (IOC) RIGHT;  Surgeon: Galen Manila, MD;  Location: [facility];  Service: Ophthalmology;  Laterality: Right;  2.98 0:27.0   CATARACT EXTRACTION W/PHACO Left 12/22/2021   Procedure: CATARACT  EXTRACTION PHACO AND INTRAOCULAR LENS PLACEMENT (IOC) LEFT 2.38 00:26.0;  Surgeon: Galen Manila, MD;  Location: [facility];  Service: Ophthalmology;  Laterality: Left;   COLONOSCOPY WITH PROPOFOL N/A 07/10/2018   Procedure: COLONOSCOPY WITH PROPOFOL;  Surgeon: Scot Jun, MD;  Location: [facility];  Service: Endoscopy;  Laterality: N/A;   endometrrial ablation     EYE SURGERY     knee athroscopy Left 2013   laprascopic surgery     endometreosis   NASOPHARYNGOSCOPY EUSTATION TUBE BALLOON DILATION Bilateral 05/18/2018   Procedure: NASOPHARYNGOSCOPY EUSTATION TUBE BALLOON DILATION OUTFRACTURE BILATERAL INFERIOR TURBINATES;  Surgeon: Vernie Murders, MD;  Location: [facility];  Service: ENT;  Laterality: Bilateral;   NASOPHARYNGOSCOPY EUSTATION TUBE BALLOON DILATION     nova-sure     THYROIDECTOMY, PARTIAL     TONSILLECTOMY AND ADENOIDECTOMY     tubes in ears      Home Medications:  Allergies as of 01/05/2023       Reactions   Ciprofloxacin Anaphylaxis   Floxin [ofloxacin] Anaphylaxis   Levofloxacin Anaphylaxis   Quinolones Anaphylaxis   Amoxicillin    fever   Bextra [valdecoxib]    Gyne-lotrimin [clotrimazole]    redness   Penicillins Rash   Septra [sulfamethoxazole-trimethoprim] Rash   Flushed and fever.   Sulfa Antibiotics Rash   fever        Medication List        Accurate as of Jan 05, 2023  2:54 PM. If you have any questions, ask your nurse or doctor.          STOP taking these medications    azithromycin 250 MG tablet Commonly known as: ZITHROMAX Stopped by: Michiel Cowboy, PA-C   FreeStyle Libre 2 Sensor Misc Stopped by: Michiel Cowboy, PA-C       TAKE these medications    albuterol 108 (90 Base) MCG/ACT inhaler Commonly known as: VENTOLIN HFA Inhale 2 inhalations into the lungs every 4 (four) hours as needed for Wheezing   cholecalciferol 1000 units tablet Commonly known as: VITAMIN D Take 1,000 Units by mouth  daily.   EPINEPHrine 0.3 mg/0.3 mL Soaj injection Commonly known as: EPI-PEN Inject 0.3 mg into the muscle as needed for anaphylaxis.   fexofenadine 180 MG tablet Commonly known as: ALLEGRA Allegra Allergy 180 mg tablet  Take 1 tablet every day by oral route.   fluticasone 50 MCG/ACT nasal spray Commonly known as: FLONASE Place 2 sprays into both nostrils once daily as needed   imipramine 25 MG tablet Commonly known as: TOFRANIL Take 2 tablets (50 mg total) by mouth at bedtime. What changed: Another medication with the same name was removed. Continue taking this medication, and follow the directions you see here. Changed by: Michiel Cowboy, PA-C   levothyroxine 100 MCG tablet Commonly known as: SYNTHROID Take 1 tablet (100 mcg total) by mouth once daily  lovastatin 20 MG tablet Commonly known as: MEVACOR Take 1 tablet (20 mg total) by mouth daily with dinner What changed: Another medication with the same name was removed. Continue taking this medication, and follow the directions you see here. Changed by: Michiel Cowboy, PA-C   metoprolol succinate 50 MG 24 hr tablet Commonly known as: TOPROL-XL Take 1 tablet (50 mg total) by mouth daily.   montelukast 10 MG tablet Commonly known as: SINGULAIR Take 1 tablet (10 mg total) by mouth at bedtime   Multi-Vitamins Tabs Take by mouth.   nitrofurantoin 50 MG capsule Commonly known as: MACRODANTIN Take 1 capsule (50 mg total) by mouth daily.        Allergies:  Allergies  Allergen Reactions   Ciprofloxacin Anaphylaxis   Floxin [Ofloxacin] Anaphylaxis   Levofloxacin Anaphylaxis   Quinolones Anaphylaxis   Amoxicillin     fever   Bextra [Valdecoxib]    Gyne-Lotrimin [Clotrimazole]     redness   Penicillins Rash   Septra [Sulfamethoxazole-Trimethoprim] Rash    Flushed and fever.   Sulfa Antibiotics Rash    fever    Family History: Family History  Problem Relation Age of Onset   Cancer Mother     Hyperlipidemia Mother    Heart disease Mother    Hypertension Mother    Breast cancer Mother 26   Kidney failure Mother    Stroke Mother    Cancer Father        colon   Hyperlipidemia Father    Heart disease Father    Hypertension Father    Breast cancer Maternal Aunt 8   Breast cancer Paternal Aunt 42   Kidney cancer Neg Hx    Bladder Cancer Neg Hx    Prostate cancer Neg Hx    Ovarian cancer Neg Hx    Diabetes Neg Hx     Social History:  reports that she has never smoked. She has never been exposed to tobacco smoke. She has never used smokeless tobacco. She reports that she does not drink alcohol and does not use drugs.  ROS: For pertinent review of systems please refer to history of present illness  Physical Exam: BP 106/72   Pulse 98   Ht 5\' 2"  (1.575 m)   Wt 128 lb (58.1 kg)   LMP 07/01/2014 (Exact Date)   BMI 23.41 kg/m   Constitutional:  Well nourished. Alert and oriented, No acute distress. HEENT: [place] AT, moist mucus membranes.  Trachea midline Cardiovascular: No clubbing, cyanosis, or edema. Respiratory: Normal respiratory effort, no increased work of breathing. Neurologic: Grossly intact, no focal deficits, moving all 4 extremities. Psychiatric: Normal mood and affect.    Laboratory Data: Serum creatinine of 0.8 on November 05, 2022 with a EGFR of 84 Hemoglobin A1c March 15 to 24 was 5.7 I have reviewed the labs.    Pertinent Imaging: N/A  Assessment & Plan:    1. rUTI's -recent cysto NED -On suppressive nitrofurantoin daily -She will continue the nitrofurantoin 50 mg 3 days a week, she understands the risk of pulmonary fibrosis and will have her PCP follow-up her with chest x-rays -She will contact us with any breakthrough infections  2. Vaginal atrophy Cannot use vaginal estrogen cream due to endometrial bleeding  3. Cystocele Not bothersome                                  Return in about 1 year (  around 01/05/2024) for OAB .  These notes  generated with voice recognition software. I apologize for typographical errors.  Cloretta Ned  [facility] [address] [place], [place] [address] [phone]

## 2023-01-05 ENCOUNTER — Ambulatory Visit: Payer: 59 | Admitting: Urology

## 2023-01-05 ENCOUNTER — Other Ambulatory Visit: Payer: Self-pay

## 2023-01-05 ENCOUNTER — Encounter: Payer: Self-pay | Admitting: Urology

## 2023-01-05 VITALS — BP 106/72 | HR 98 | Ht 62.0 in | Wt 128.0 lb

## 2023-01-05 DIAGNOSIS — N8111 Cystocele, midline: Secondary | ICD-10-CM

## 2023-01-05 DIAGNOSIS — N952 Postmenopausal atrophic vaginitis: Secondary | ICD-10-CM | POA: Diagnosis not present

## 2023-01-05 DIAGNOSIS — N390 Urinary tract infection, site not specified: Secondary | ICD-10-CM

## 2023-01-05 DIAGNOSIS — Z87440 Personal history of urinary (tract) infections: Secondary | ICD-10-CM

## 2023-01-05 MED ORDER — NITROFURANTOIN MACROCRYSTAL 50 MG PO CAPS
50.0000 mg | ORAL_CAPSULE | Freq: Every day | ORAL | 1 refills | Status: DC
  Filled 2023-01-05: qty 90, 90d supply, fill #0
  Filled 2023-07-07: qty 90, 90d supply, fill #1

## 2023-01-05 MED ORDER — MONTELUKAST SODIUM 10 MG PO TABS
10.0000 mg | ORAL_TABLET | Freq: Every day | ORAL | 1 refills | Status: DC
  Filled 2023-01-05: qty 90, 90d supply, fill #0
  Filled 2023-04-10: qty 90, 90d supply, fill #1

## 2023-01-23 ENCOUNTER — Other Ambulatory Visit: Payer: Self-pay

## 2023-01-24 ENCOUNTER — Other Ambulatory Visit: Payer: Self-pay

## 2023-01-24 MED ORDER — LEVOTHYROXINE SODIUM 100 MCG PO TABS
100.0000 ug | ORAL_TABLET | Freq: Every day | ORAL | 1 refills | Status: DC
  Filled 2023-01-24: qty 90, 90d supply, fill #0
  Filled 2023-04-26: qty 90, 90d supply, fill #1

## 2023-02-07 ENCOUNTER — Other Ambulatory Visit: Payer: Self-pay

## 2023-04-10 ENCOUNTER — Other Ambulatory Visit: Payer: Self-pay

## 2023-04-11 ENCOUNTER — Other Ambulatory Visit: Payer: Self-pay

## 2023-04-11 MED ORDER — LOVASTATIN 20 MG PO TABS
20.0000 mg | ORAL_TABLET | Freq: Every day | ORAL | 1 refills | Status: DC
  Filled 2023-04-11: qty 90, 90d supply, fill #0
  Filled 2023-07-07: qty 90, 90d supply, fill #1

## 2023-04-13 ENCOUNTER — Other Ambulatory Visit: Payer: Self-pay

## 2023-04-27 ENCOUNTER — Other Ambulatory Visit: Payer: Self-pay

## 2023-05-13 DIAGNOSIS — R7303 Prediabetes: Secondary | ICD-10-CM | POA: Diagnosis not present

## 2023-05-13 DIAGNOSIS — E7800 Pure hypercholesterolemia, unspecified: Secondary | ICD-10-CM | POA: Diagnosis not present

## 2023-05-18 DIAGNOSIS — Z8639 Personal history of other endocrine, nutritional and metabolic disease: Secondary | ICD-10-CM | POA: Diagnosis not present

## 2023-05-18 DIAGNOSIS — E7800 Pure hypercholesterolemia, unspecified: Secondary | ICD-10-CM | POA: Diagnosis not present

## 2023-05-18 DIAGNOSIS — R7303 Prediabetes: Secondary | ICD-10-CM | POA: Diagnosis not present

## 2023-05-23 ENCOUNTER — Other Ambulatory Visit: Payer: Self-pay

## 2023-05-25 ENCOUNTER — Telehealth: Payer: Self-pay

## 2023-05-25 NOTE — Telephone Encounter (Signed)
Called patient but had to leave her a voicemail letting her know that Dr. Tobi Bastos reviewed her chart and didn't see a reason to come in to schedule her colonoscopy. Therefore, he asked for her colonoscopy be scheduled without being seeing at the office. If she calls and lets [place] know that she is having any issues, then she could keep her appointment.

## 2023-05-26 ENCOUNTER — Other Ambulatory Visit: Payer: Self-pay | Admitting: *Deleted

## 2023-05-26 ENCOUNTER — Ambulatory Visit: Payer: 59 | Admitting: Gastroenterology

## 2023-05-26 ENCOUNTER — Telehealth: Payer: Self-pay | Admitting: *Deleted

## 2023-05-26 ENCOUNTER — Telehealth: Payer: Self-pay | Admitting: Gastroenterology

## 2023-05-26 ENCOUNTER — Other Ambulatory Visit: Payer: Self-pay

## 2023-05-26 DIAGNOSIS — Z860100 Personal history of colon polyps, unspecified: Secondary | ICD-10-CM

## 2023-05-26 DIAGNOSIS — Z800 Family history of malignant neoplasm of digestive organs: Secondary | ICD-10-CM

## 2023-05-26 MED ORDER — NA SULFATE-K SULFATE-MG SULF 17.5-3.13-1.6 GM/177ML PO SOLN
1.0000 | Freq: Once | ORAL | 0 refills | Status: AC
  Filled 2023-05-26: qty 354, 1d supply, fill #0

## 2023-05-26 NOTE — Telephone Encounter (Signed)
Gastroenterology Pre-Procedure Review  Request Date: 07/11/2023 Requesting Physician: Dr. Tobi Bastos  PATIENT REVIEW QUESTIONS: The patient responded to the following health history questions as indicated:    1. Are you having any GI issues? no 2. Do you have a personal history of Polyps? yes (07/10/2018 with Dr Mechele Collin) 3. Do you have a family history of Colon Cancer or Polyps? yes (father had colon cancer) 4. Diabetes Mellitus? no 5. Joint replacements in the past 12 months?no 6. Major health problems in the past 3 months?no 7. Any artificial heart valves, MVP, or defibrillator?no    MEDICATIONS & ALLERGIES:    Patient reports the following regarding taking any anticoagulation/antiplatelet therapy:   Plavix, Coumadin, Eliquis, Xarelto, Lovenox, Pradaxa, Brilinta, or Effient? no Aspirin? no  Patient confirms/reports the following medications:  Current Outpatient Medications  Medication Sig Dispense Refill   albuterol (VENTOLIN HFA) 108 (90 Base) MCG/ACT inhaler Inhale 2 inhalations into the lungs every 4 (four) hours as needed for Wheezing 18 g 1   cholecalciferol (VITAMIN D) 1000 UNITS tablet Take 1,000 Units by mouth daily.     EPINEPHrine 0.3 mg/0.3 mL IJ SOAJ injection Inject 0.3 mg into the muscle as needed for anaphylaxis.     fexofenadine (ALLEGRA) 180 MG tablet Allegra Allergy 180 mg tablet  Take 1 tablet every day by oral route.     fluticasone (FLONASE) 50 MCG/ACT nasal spray Place 2 sprays into both nostrils once daily as needed 48 g 1   imipramine (TOFRANIL) 25 MG tablet Take 2 tablets (50 mg total) by mouth at bedtime. 180 tablet 1   levothyroxine (SYNTHROID) 100 MCG tablet Take 1 tablet (100 mcg total) by mouth daily. 90 tablet 1   lovastatin (MEVACOR) 20 MG tablet Take 1 tablet (20 mg total) by mouth daily with supper. 90 tablet 1   metoprolol succinate (TOPROL-XL) 50 MG 24 hr tablet Take 1 tablet (50 mg total) by mouth daily. 90 tablet 3   montelukast (SINGULAIR) 10 MG  tablet Take 1 tablet (10 mg total) by mouth at bedtime 90 tablet 1   Multiple Vitamins-Minerals (MULTIVITAMIN ADULT) CHEW Chew 1 tablet by mouth daily.     nitrofurantoin (MACRODANTIN) 50 MG capsule Take 1 capsule (50 mg total) by mouth daily. 90 capsule 1   No current facility-administered medications for this visit.    Patient confirms/reports the following allergies:  Allergies  Allergen Reactions   Ciprofloxacin Anaphylaxis   Floxin [Ofloxacin] Anaphylaxis   Levofloxacin Anaphylaxis   Quinolones Anaphylaxis   Amoxicillin     fever   Bextra [Valdecoxib]    Gyne-Lotrimin [Clotrimazole]     redness   Penicillins Rash   Septra [Sulfamethoxazole-Trimethoprim] Rash    Flushed and fever.   Sulfa Antibiotics Rash    fever    No orders of the defined types were placed in this encounter.   AUTHORIZATION INFORMATION Primary Insurance: 1D#: Group #:  Secondary Insurance: 1D#: Group #:  SCHEDULE INFORMATION: Date: 07/11/2023 Time: Location:  [facility]

## 2023-05-26 NOTE — Telephone Encounter (Signed)
Message left for patient to return my call.  

## 2023-05-26 NOTE — Telephone Encounter (Signed)
Pt call requesting colonoscopy  was told by [facility] that office appointment was not needed

## 2023-05-30 ENCOUNTER — Other Ambulatory Visit: Payer: Self-pay

## 2023-05-30 MED ORDER — EPINEPHRINE 0.3 MG/0.3ML IJ SOAJ
0.3000 mg | INTRAMUSCULAR | 1 refills | Status: AC
  Filled 2023-05-30: qty 2, 30d supply, fill #0

## 2023-06-01 NOTE — Telephone Encounter (Signed)
Chan scheduled a colonoscopy for patient on 07/11/2023.

## 2023-06-12 ENCOUNTER — Other Ambulatory Visit: Payer: Self-pay

## 2023-06-14 ENCOUNTER — Other Ambulatory Visit: Payer: Self-pay

## 2023-06-15 ENCOUNTER — Other Ambulatory Visit: Payer: Self-pay

## 2023-06-15 MED ORDER — IMIPRAMINE HCL 25 MG PO TABS
50.0000 mg | ORAL_TABLET | Freq: Every day | ORAL | 1 refills | Status: DC
  Filled 2023-06-15: qty 180, 90d supply, fill #0
  Filled 2023-09-15: qty 180, 90d supply, fill #1
  Filled 2023-12-17: qty 40, 20d supply, fill #2

## 2023-06-28 ENCOUNTER — Other Ambulatory Visit: Payer: Self-pay | Admitting: Obstetrics and Gynecology

## 2023-06-28 DIAGNOSIS — Z1231 Encounter for screening mammogram for malignant neoplasm of breast: Secondary | ICD-10-CM

## 2023-06-29 DIAGNOSIS — D225 Melanocytic nevi of trunk: Secondary | ICD-10-CM | POA: Diagnosis not present

## 2023-06-29 DIAGNOSIS — D2261 Melanocytic nevi of right upper limb, including shoulder: Secondary | ICD-10-CM | POA: Diagnosis not present

## 2023-06-29 DIAGNOSIS — D2262 Melanocytic nevi of left upper limb, including shoulder: Secondary | ICD-10-CM | POA: Diagnosis not present

## 2023-06-29 DIAGNOSIS — D2271 Melanocytic nevi of right lower limb, including hip: Secondary | ICD-10-CM | POA: Diagnosis not present

## 2023-06-29 DIAGNOSIS — L821 Other seborrheic keratosis: Secondary | ICD-10-CM | POA: Diagnosis not present

## 2023-06-29 DIAGNOSIS — D2272 Melanocytic nevi of left lower limb, including hip: Secondary | ICD-10-CM | POA: Diagnosis not present

## 2023-06-29 DIAGNOSIS — L814 Other melanin hyperpigmentation: Secondary | ICD-10-CM | POA: Diagnosis not present

## 2023-06-29 DIAGNOSIS — Z08 Encounter for follow-up examination after completed treatment for malignant neoplasm: Secondary | ICD-10-CM | POA: Diagnosis not present

## 2023-06-29 DIAGNOSIS — Z85828 Personal history of other malignant neoplasm of skin: Secondary | ICD-10-CM | POA: Diagnosis not present

## 2023-07-04 ENCOUNTER — Encounter: Payer: Self-pay | Admitting: Gastroenterology

## 2023-07-06 DIAGNOSIS — H26493 Other secondary cataract, bilateral: Secondary | ICD-10-CM | POA: Diagnosis not present

## 2023-07-07 ENCOUNTER — Other Ambulatory Visit: Payer: Self-pay

## 2023-07-08 ENCOUNTER — Other Ambulatory Visit: Payer: Self-pay

## 2023-07-08 MED ORDER — MONTELUKAST SODIUM 10 MG PO TABS
10.0000 mg | ORAL_TABLET | Freq: Every day | ORAL | 1 refills | Status: DC
  Filled 2023-07-08: qty 90, 90d supply, fill #0
  Filled 2023-10-16: qty 90, 90d supply, fill #1

## 2023-07-08 MED ORDER — FLUTICASONE PROPIONATE 50 MCG/ACT NA SUSP
2.0000 | Freq: Every day | NASAL | 1 refills | Status: AC
  Filled 2023-07-08: qty 48, 90d supply, fill #0
  Filled 2024-05-27: qty 48, 90d supply, fill #1

## 2023-07-11 ENCOUNTER — Ambulatory Visit: Payer: 59 | Admitting: Anesthesiology

## 2023-07-11 ENCOUNTER — Other Ambulatory Visit: Payer: Self-pay

## 2023-07-11 ENCOUNTER — Encounter
Admission: RE | Disposition: A | Discharge status: Home / Self Care | Payer: Self-pay | Source: Home / Self Care | Attending: Gastroenterology

## 2023-07-11 ENCOUNTER — Ambulatory Visit
Admission: RE | Admit: 2023-07-11 | Discharge: 2023-07-11 | Disposition: A | Discharge status: Home / Self Care | Payer: 59 | Attending: Gastroenterology | Admitting: Gastroenterology

## 2023-07-11 DIAGNOSIS — J45909 Unspecified asthma, uncomplicated: Secondary | ICD-10-CM | POA: Diagnosis not present

## 2023-07-11 DIAGNOSIS — Z800 Family history of malignant neoplasm of digestive organs: Secondary | ICD-10-CM | POA: Insufficient documentation

## 2023-07-11 DIAGNOSIS — K639 Disease of intestine, unspecified: Secondary | ICD-10-CM

## 2023-07-11 DIAGNOSIS — I10 Essential (primary) hypertension: Secondary | ICD-10-CM | POA: Diagnosis not present

## 2023-07-11 DIAGNOSIS — K589 Irritable bowel syndrome without diarrhea: Secondary | ICD-10-CM | POA: Diagnosis not present

## 2023-07-11 DIAGNOSIS — K635 Polyp of colon: Secondary | ICD-10-CM | POA: Insufficient documentation

## 2023-07-11 DIAGNOSIS — Z83719 Family history of colon polyps, unspecified: Secondary | ICD-10-CM | POA: Insufficient documentation

## 2023-07-11 DIAGNOSIS — D126 Benign neoplasm of colon, unspecified: Secondary | ICD-10-CM

## 2023-07-11 DIAGNOSIS — Z1211 Encounter for screening for malignant neoplasm of colon: Secondary | ICD-10-CM | POA: Diagnosis not present

## 2023-07-11 DIAGNOSIS — Z860100 Personal history of colon polyps, unspecified: Secondary | ICD-10-CM | POA: Diagnosis not present

## 2023-07-11 HISTORY — PX: COLONOSCOPY WITH PROPOFOL: SHX5780

## 2023-07-11 HISTORY — PX: POLYPECTOMY: SHX5525

## 2023-07-11 SURGERY — COLONOSCOPY WITH PROPOFOL
Anesthesia: General

## 2023-07-11 MED ORDER — SODIUM CHLORIDE 0.9 % IV SOLN: INTRAVENOUS | Status: DC

## 2023-07-11 MED ORDER — PHENYLEPHRINE 80 MCG/ML (10ML) SYRINGE FOR IV PUSH (FOR BLOOD PRESSURE SUPPORT)
PREFILLED_SYRINGE | INTRAVENOUS | Status: AC
  Filled 2023-07-11: qty 10

## 2023-07-11 MED ORDER — PROPOFOL 1000 MG/100ML IV EMUL
INTRAVENOUS | Status: AC
  Filled 2023-07-11: qty 100

## 2023-07-11 MED ORDER — PROPOFOL 500 MG/50ML IV EMUL
INTRAVENOUS | Status: DC | PRN
  Administered 2023-07-11: 125 ug/kg/min via INTRAVENOUS

## 2023-07-11 MED ORDER — PROPOFOL 10 MG/ML IV BOLUS
INTRAVENOUS | Status: DC | PRN
  Administered 2023-07-11 (×2): 30 mg via INTRAVENOUS
  Administered 2023-07-11: 70 mg via INTRAVENOUS
  Administered 2023-07-11: 20 mg via INTRAVENOUS

## 2023-07-11 MED ORDER — LIDOCAINE HCL (CARDIAC) PF 100 MG/5ML IV SOSY
PREFILLED_SYRINGE | INTRAVENOUS | Status: DC | PRN
  Administered 2023-07-11: 20 mg via INTRAVENOUS

## 2023-07-11 MED ORDER — PHENYLEPHRINE HCL (PRESSORS) 10 MG/ML IV SOLN
INTRAVENOUS | Status: DC | PRN
  Administered 2023-07-11: 120 ug via INTRAVENOUS

## 2023-07-11 MED ORDER — GLYCOPYRROLATE 0.2 MG/ML IJ SOLN
INTRAMUSCULAR | Status: DC | PRN
  Administered 2023-07-11: .2 mg via INTRAVENOUS

## 2023-07-11 MED ORDER — GLYCOPYRROLATE 0.2 MG/ML IJ SOLN
INTRAMUSCULAR | Status: AC
  Filled 2023-07-11: qty 1

## 2023-07-11 NOTE — Anesthesia Postprocedure Evaluation (Signed)
Anesthesia Post Note  Patient: [name]  Procedure(s) Performed: COLONOSCOPY WITH PROPOFOL POLYPECTOMY  Patient location during evaluation: Endoscopy Anesthesia Type: General Level of consciousness: awake and alert Pain management: pain level controlled Vital Signs Assessment: post-procedure vital signs reviewed and stable Respiratory status: spontaneous breathing, nonlabored ventilation, respiratory function stable and patient connected to nasal cannula oxygen Cardiovascular status: blood pressure returned to baseline and stable Postop Assessment: no apparent nausea or vomiting Anesthetic complications: no   No notable events documented.   Last Vitals:  Vitals:   07/11/23 0928 07/11/23 0933  BP: 95/69 98/67  Pulse: (!) 115 (!) 112  Resp: 16 18  Temp:    SpO2: 99% 99%    Last Pain:  Vitals:   07/11/23 0933  TempSrc:   PainSc: 0-No pain                 Louie Boston

## 2023-07-11 NOTE — Transfer of Care (Signed)
Immediate Anesthesia Transfer of Care Note  Patient: [name]  Procedure(s) Performed: COLONOSCOPY WITH PROPOFOL POLYPECTOMY  Patient Location: PACU  Anesthesia Type:General  Level of Consciousness: drowsy  Airway & Oxygen Therapy: Patient Spontanous Breathing  Post-op Assessment: Report given to RN and Post -op Vital signs reviewed and stable  Post vital signs: Reviewed and stable  Last Vitals:  Vitals Value Taken Time  BP 95/69 07/11/23 0928  Temp 35.9 C 07/11/23 0923  Pulse 111 07/11/23 0928  Resp 15 07/11/23 0928  SpO2 99 % 07/11/23 0928  Vitals shown include unfiled device data.  Last Pain:  Vitals:   07/11/23 0928  TempSrc:   PainSc: Asleep         Complications: No notable events documented.

## 2023-07-11 NOTE — Op Note (Signed)
[facility] Gastroenterology Patient Name: [name] Procedure Date: 07/11/2023 8:52 AM MRN: [id number] Account #: [id number] Date of Birth: 06-Mar-1961 Admit Type: Outpatient Age: 62 Room: [facility] ENDO ROOM 1 Gender: Female Note Status: Finalized Instrument Name: Prentice Docker [id number] Procedure:             Colonoscopy Indications:           Screening in patient at increased risk: Family history                         of 1st-degree relative with colorectal cancer, Last                         colonoscopy: November 2019 Providers:             Wyline Mood MD, MD Referring MD:          Marisue Ivan (Referring MD) Medicines:             Monitored Anesthesia Care Complications:         No immediate complications. Procedure:             Pre-Anesthesia Assessment:                        - Prior to the procedure, a History and Physical was                         performed, and patient medications, allergies and                         sensitivities were reviewed. The patient's tolerance                         of previous anesthesia was reviewed.                        - The risks and benefits of the procedure and the                         sedation options and risks were discussed with the                         patient. All questions were answered and informed                         consent was obtained.                        - ASA Grade Assessment: II - A patient with mild                         systemic disease.                        After obtaining informed consent, the colonoscope was                         passed under direct vision. Throughout the procedure,                         the patient's blood  pressure, pulse, and oxygen                         saturations were monitored continuously. The                         Colonoscope was introduced through the anus and                         advanced to the the cecum, identified by the                          appendiceal orifice. The colonoscopy was performed                         with ease. The patient tolerated the procedure well.                         The quality of the bowel preparation was good. The                         ileocecal valve, appendiceal orifice, and rectum were                         photographed. Findings:      The perianal and digital rectal examinations were normal.      A 3 mm polyp was found in the cecum. The polyp was sessile. The polyp       was removed with a cold biopsy forceps. Resection and retrieval were       complete.      Two sessile polyps were found in the transverse colon. The polyps were 3       to 4 mm in size. These polyps were removed with a cold biopsy forceps.       Resection and retrieval were complete.      The exam was otherwise without abnormality on direct and retroflexion       views. Impression:            - One 3 mm polyp in the cecum, removed with a cold                         biopsy forceps. Resected and retrieved.                        - Two 3 to 4 mm polyps in the transverse colon,                         removed with a cold biopsy forceps. Resected and                         retrieved.                        - The examination was otherwise normal on direct and                         retroflexion views. Recommendation:        - Discharge patient to home (with escort).                        -  Resume previous diet.                        - Continue present medications.                        - Await pathology results.                        - Repeat colonoscopy in 3 - 5 years for surveillance                         based on pathology results. Procedure Code(s):     --- Professional ---                        [id number], Colonoscopy, flexible; with biopsy, single or                         multiple Diagnosis Code(s):     --- Professional ---                        Z80.0, Family history of malignant neoplasm of                          digestive organs                        D12.0, Benign neoplasm of cecum                        D12.3, Benign neoplasm of transverse colon (hepatic                         flexure or splenic flexure) CPT copyright 2022 [employer] Association. All rights reserved. The codes documented in this report are preliminary and upon coder review may  be revised to meet current compliance requirements. Wyline Mood, MD Wyline Mood MD, MD 07/11/2023 9:22:50 AM This report has been signed electronically. Number of Addenda: 0 Note Initiated On: 07/11/2023 8:52 AM Scope Withdrawal Time: 0 hours 11 minutes 34 seconds  Total Procedure Duration: 0 hours 15 minutes 3 seconds  Estimated Blood Loss:  Estimated blood loss: none.      [facility]

## 2023-07-11 NOTE — Anesthesia Preprocedure Evaluation (Signed)
Anesthesia Evaluation  Patient identified by MRN, date of birth, ID band Patient awake    Reviewed: Allergy & Precautions, NPO status , Patient's Chart, lab work & pertinent test results  History of Anesthesia Complications (+) PONV and history of anesthetic complications  Airway Mallampati: III  TM Distance: >3 FB Neck ROM: full    Dental no notable dental hx.    Pulmonary asthma    Pulmonary exam normal        Cardiovascular hypertension, + dysrhythmias Supra Ventricular Tachycardia      Neuro/Psych negative neurological ROS  negative psych ROS   GI/Hepatic negative GI ROS, Neg liver ROS,,,  Endo/Other  Hypothyroidism    Renal/GU negative Renal ROS  negative genitourinary   Musculoskeletal  (+) Arthritis ,    Abdominal   Peds  Hematology negative hematology ROS (+)   Anesthesia Other Findings Past Medical History: No date: Allergy No date: Arthritis     Comment:  osteoarthritis - fingers No date: Asthma No date: Chicken pox No date: Chronic UTI No date: Cystocele     Comment:  2nd degree No date: Endometriosis No date: Family history of polyps in the colon No date: Graves disease No date: History of heavy periods No date: Hole in the ear drum, right No date: Hyperlipemia No date: IBS (irritable bowel syndrome) No date: Perimenopausal No date: PONV (postoperative nausea and vomiting)     Comment:  nausea only No date: Rectocele     Comment:  mild No date: S/P endometrial ablation No date: Scoliosis No date: Skin cancer No date: SUI (stress urinary incontinence, female) No date: SVT (supraventricular tachycardia) (HCC) No date: Urine incontinence No date: Wears contact lenses No date: Wears hearing aid in both ears  Past Surgical History: 11/03/2021: CATARACT EXTRACTION W/PHACO; Right     Comment:  Procedure: CATARACT EXTRACTION PHACO AND INTRAOCULAR               LENS PLACEMENT (IOC) RIGHT;   Surgeon: Galen Manila,               MD;  Location: [facility];  Service:               Ophthalmology;  Laterality: Right;  2.98 0:27.0 12/22/2021: CATARACT EXTRACTION W/PHACO; Left     Comment:  Procedure: CATARACT EXTRACTION PHACO AND INTRAOCULAR               LENS PLACEMENT (IOC) LEFT 2.38 00:26.0;  Surgeon:               Galen Manila, MD;  Location: [facility];                Service: Ophthalmology;  Laterality: Left; 07/10/2018: COLONOSCOPY WITH PROPOFOL; N/A     Comment:  Procedure: COLONOSCOPY WITH PROPOFOL;  Surgeon: Scot Jun, MD;  Location: [facility];  Service:               Endoscopy;  Laterality: N/A; No date: endometrrial ablation No date: EYE SURGERY 2013: knee athroscopy; Left No date: laprascopic surgery     Comment:  endometreosis 05/18/2018: NASOPHARYNGOSCOPY EUSTATION TUBE BALLOON DILATION;  Bilateral     Comment:  Procedure: NASOPHARYNGOSCOPY EUSTATION TUBE BALLOON               DILATION OUTFRACTURE BILATERAL INFERIOR TURBINATES;  Surgeon: Vernie Murders, MD;  Location: [facility];  Service: ENT;  Laterality: Bilateral; No date: NASOPHARYNGOSCOPY EUSTATION TUBE BALLOON DILATION No date: nova-sure No date: THYROIDECTOMY, PARTIAL No date: TONSILLECTOMY AND ADENOIDECTOMY No date: tubes in ears     Reproductive/Obstetrics negative OB ROS                             Anesthesia Physical Anesthesia Plan  ASA: 3  Anesthesia Plan: General   Post-op Pain Management: Minimal or no pain anticipated   Induction: Intravenous  PONV Risk Score and Plan: 3 and Propofol infusion and TIVA  Airway Management Planned: Natural Airway and Nasal Cannula  Additional Equipment:   Intra-op Plan:   Post-operative Plan:   Informed Consent: I have reviewed the patients History and Physical, chart, labs and discussed the procedure including the risks, benefits  and alternatives for the proposed anesthesia with the patient or authorized representative who has indicated his/her understanding and acceptance.     Dental Advisory Given  Plan Discussed with: Anesthesiologist, CRNA and Surgeon  Anesthesia Plan Comments: (Patient consented for risks of anesthesia including but not limited to:  - adverse reactions to medications - risk of airway placement if required - damage to eyes, teeth, lips or other oral mucosa - nerve damage due to positioning  - sore throat or hoarseness - Damage to heart, brain, nerves, lungs, other parts of body or loss of life  Patient voiced understanding and assent.)       Anesthesia Quick Evaluation

## 2023-07-11 NOTE — H&P (Signed)
Wyline Mood, MD [address], [place], [place], [address] [address], [place], [place], [address] Phone: [phone]  Fax: [phone]  Primary Care Physician:  Marisue Ivan, MD   Pre-Procedure History & Physical: HPI:  [name] is a 62 y.o. female is here for an colonoscopy.   Past Medical History:  Diagnosis Date   Allergy    Arthritis    osteoarthritis - fingers   Asthma    Chicken pox    Chronic UTI    Cystocele    2nd degree   Endometriosis    Family history of polyps in the colon    Graves disease    History of heavy periods    Hole in the ear drum, right    Hyperlipemia    IBS (irritable bowel syndrome)    Perimenopausal    PONV (postoperative nausea and vomiting)    nausea only   Rectocele    mild   S/P endometrial ablation    Scoliosis    Skin cancer    SUI (stress urinary incontinence, female)    SVT (supraventricular tachycardia) (HCC)    Urine incontinence    Wears contact lenses    Wears hearing aid in both ears     Past Surgical History:  Procedure Laterality Date   CATARACT EXTRACTION W/PHACO Right 11/03/2021   Procedure: CATARACT EXTRACTION PHACO AND INTRAOCULAR LENS PLACEMENT (IOC) RIGHT;  Surgeon: Galen Manila, MD;  Location: [facility];  Service: Ophthalmology;  Laterality: Right;  2.98 0:27.0   CATARACT EXTRACTION W/PHACO Left 12/22/2021   Procedure: CATARACT EXTRACTION PHACO AND INTRAOCULAR LENS PLACEMENT (IOC) LEFT 2.38 00:26.0;  Surgeon: Galen Manila, MD;  Location: [facility];  Service: Ophthalmology;  Laterality: Left;   COLONOSCOPY WITH PROPOFOL N/A 07/10/2018   Procedure: COLONOSCOPY WITH PROPOFOL;  Surgeon: Scot Jun, MD;  Location: [facility];  Service: Endoscopy;  Laterality: N/A;   endometrrial ablation     EYE SURGERY     knee athroscopy Left 2013   laprascopic surgery     endometreosis   NASOPHARYNGOSCOPY EUSTATION TUBE BALLOON DILATION Bilateral  05/18/2018   Procedure: NASOPHARYNGOSCOPY EUSTATION TUBE BALLOON DILATION OUTFRACTURE BILATERAL INFERIOR TURBINATES;  Surgeon: Vernie Murders, MD;  Location: [facility];  Service: ENT;  Laterality: Bilateral;   NASOPHARYNGOSCOPY EUSTATION TUBE BALLOON DILATION     nova-sure     THYROIDECTOMY, PARTIAL     TONSILLECTOMY AND ADENOIDECTOMY     tubes in ears      Prior to Admission medications   Medication Sig Start Date End Date Taking? Authorizing Provider  albuterol (VENTOLIN HFA) 108 (90 Base) MCG/ACT inhaler Inhale 2 inhalations into the lungs every 4 (four) hours as needed for Wheezing 05/21/21     cholecalciferol (VITAMIN D) 1000 UNITS tablet Take 1,000 Units by mouth daily.    [provider]  EPINEPHrine 0.3 mg/0.3 mL IJ SOAJ injection Inject 0.3 mg into the muscle as needed for anaphylaxis.    [provider]  EPINEPHrine 0.3 mg/0.3 mL IJ SOAJ injection Inject 0.3 mLs (0.3 mg total) into the muscle once as needed for up to 1 dose. 05/30/23     fexofenadine (ALLEGRA) 180 MG tablet Allegra Allergy 180 mg tablet  Take 1 tablet every day by oral route.    [provider]  fluticasone (FLONASE) 50 MCG/ACT nasal spray Place 2 sprays into both nostrils once daily as needed 05/21/21     fluticasone (FLONASE) 50 MCG/ACT nasal spray  Spray 2 sprays into both nostrils once daily as needed. 07/08/23     imipramine (TOFRANIL) 25 MG tablet Take 2 tablets (50 mg total) by mouth at bedtime. 12/13/22     imipramine (TOFRANIL) 25 MG tablet Take 2 tablets (50 mg total) by mouth at bedtime. 06/15/23     levothyroxine (SYNTHROID) 100 MCG tablet Take 1 tablet (100 mcg total) by mouth daily. 01/24/23     lovastatin (MEVACOR) 20 MG tablet Take 1 tablet (20 mg total) by mouth daily with supper. 04/11/23     metoprolol succinate (TOPROL-XL) 50 MG 24 hr tablet Take 1 tablet (50 mg total) by mouth daily. 12/13/22     montelukast (SINGULAIR) 10 MG tablet Take 1 tablet (10 mg total) by mouth  at bedtime 07/08/23     Multiple Vitamins-Minerals (MULTIVITAMIN ADULT) CHEW Chew 1 tablet by mouth daily.    [provider]  nitrofurantoin (MACRODANTIN) 50 MG capsule Take 1 capsule (50 mg total) by mouth daily. 01/05/23   Michiel Cowboy A, PA-C    Allergies as of 05/26/2023 - Review Complete 01/05/2023  Allergen Reaction Noted   Ciprofloxacin Anaphylaxis 01/18/2014   Floxin [ofloxacin] Anaphylaxis 01/18/2014   Levofloxacin Anaphylaxis    Quinolones Anaphylaxis 06/15/2013   Amoxicillin  01/18/2014   Bextra [valdecoxib]  01/18/2014   Gyne-lotrimin [clotrimazole]  01/18/2014   Penicillins Rash 12/25/2014   Septra [sulfamethoxazole-trimethoprim] Rash 01/18/2014   Sulfa antibiotics Rash 12/25/2014    Family History  Problem Relation Age of Onset   Cancer Mother    Hyperlipidemia Mother    Heart disease Mother    Hypertension Mother    Breast cancer Mother 78   Kidney failure Mother    Stroke Mother    Cancer Father        colon   Hyperlipidemia Father    Heart disease Father    Hypertension Father    Breast cancer Maternal Aunt 13   Breast cancer Paternal Aunt 8   Kidney cancer Neg Hx    Bladder Cancer Neg Hx    Prostate cancer Neg Hx    Ovarian cancer Neg Hx    Diabetes Neg Hx     Social History   Socioeconomic History   Marital status: Married    Spouse name: Not on file   Number of children: Not on file   Years of education: Not on file   Highest education level: Not on file  Occupational History   Occupation: [employer]  Tobacco Use   Smoking status: Never    Passive exposure: Never   Smokeless tobacco: Never  Vaping Use   Vaping status: Never Used  Substance and Sexual Activity   Alcohol use: No    Alcohol/week: 0.0 standard drinks of alcohol   Drug use: No   Sexual activity: Yes    Birth control/protection: Post-menopausal  Other Topics Concern   Not on file  Social History Narrative   Not on file   Social Determinants of  Health   Financial Resource Strain: Low Risk  (05/18/2023)   Received from [facility]   Overall Financial Resource Strain (CARDIA)    Difficulty of Paying Living Expenses: Not hard at all  Food Insecurity: No Food Insecurity (05/18/2023)   Received from [facility]   Hunger Vital Sign    Worried About Running Out of Food in the Last Year: Never true    Ran Out of Food in the Last Year: Never true  Transportation  Needs: No Transportation Needs (05/18/2023)   Received from [facility]    In the past 12 months, has lack of transportation kept you from medical appointments or from getting medications?: No    Lack of Transportation (Non-Medical): No  Physical Activity: Sufficiently Active (04/19/2018)   Exercise Vital Sign    Days of Exercise per Week: 4 days    Minutes of Exercise per Session: 40 min  Stress: Not on file  Social Connections: Not on file  Intimate Partner Violence: Not on file    Review of Systems: See HPI, otherwise negative ROS  Physical Exam: LMP 07/01/2014 (Exact Date)  General:   Alert,  pleasant and cooperative in NAD Head:  Normocephalic and atraumatic. Neck:  Supple; no masses or thyromegaly. Lungs:  Clear throughout to auscultation, normal respiratory effort.    Heart:  +S1, +S2, Regular rate and rhythm, No edema. Abdomen:  Soft, nontender and nondistended. Normal bowel sounds, without guarding, and without rebound.   Neurologic:  Alert and  oriented x4;  grossly normal neurologically.  Impression/Plan: [name] is here for an colonoscopy to be performed for surveillance due to prior history of colon polyps   Risks, benefits, limitations, and alternatives regarding  colonoscopy have been reviewed with the patient.  Questions have been answered.  All parties agreeable.   Wyline Mood, MD  07/11/2023, 8:20 AM

## 2023-07-12 ENCOUNTER — Encounter: Payer: Self-pay | Admitting: Gastroenterology

## 2023-07-13 DIAGNOSIS — M159 Polyosteoarthritis, unspecified: Secondary | ICD-10-CM | POA: Diagnosis not present

## 2023-07-14 ENCOUNTER — Encounter: Payer: Self-pay | Admitting: Gastroenterology

## 2023-07-14 ENCOUNTER — Other Ambulatory Visit: Payer: Self-pay

## 2023-07-14 LAB — SURGICAL PATHOLOGY

## 2023-07-22 ENCOUNTER — Other Ambulatory Visit: Payer: Self-pay

## 2023-07-22 MED ORDER — LEVOTHYROXINE SODIUM 100 MCG PO TABS
100.0000 ug | ORAL_TABLET | Freq: Every day | ORAL | 1 refills | Status: DC
  Filled 2023-07-22: qty 90, 90d supply, fill #0
  Filled 2023-10-22: qty 90, 90d supply, fill #1

## 2023-08-03 ENCOUNTER — Ambulatory Visit
Admission: RE | Admit: 2023-08-03 | Discharge: 2023-08-03 | Disposition: A | Discharge status: Home / Self Care | Payer: 59 | Source: Ambulatory Visit | Attending: Obstetrics and Gynecology | Admitting: Obstetrics and Gynecology

## 2023-08-03 DIAGNOSIS — Z1231 Encounter for screening mammogram for malignant neoplasm of breast: Secondary | ICD-10-CM | POA: Insufficient documentation

## 2023-08-08 NOTE — Progress Notes (Unsigned)
ANNUAL PREVENTATIVE CARE GYNECOLOGY  ENCOUNTER NOTE  Subjective:       [name] is a 62 y.o. G32P2002 female here for a routine annual gynecologic exam. The patient {is/is not/has never been:13135} sexually active. The patient {is/is not:13135} taking hormone replacement therapy. {post-men bleed:13152::"Patient denies post-menopausal vaginal bleeding."} The patient wears seatbelts: {yes/no:311178}. The patient participates in regular exercise: {yes/no/not asked:9010}. Has the patient ever been transfused or tattooed?: {yes/no/not asked:9010}. The patient reports that there {is/is not:9024} domestic violence in her life.  Current complaints: 1.  ***    Gynecologic History Patient's last menstrual period was 07/01/2014 (exact date). Contraception: post menopausal status Last Pap: 05/07/2020. Results were: normal Last mammogram: 08/03/2023. Results were: normal Last Colonoscopy: 07/11/2023: 5 years Last Dexa Scan: Never Done   Obstetric History OB History  Gravida Para Term Preterm AB Living  2 2 2   2   SAB IAB Ectopic Multiple Live Births      2    # Outcome Date GA Lbr Len/2nd Weight Sex Type Anes PTL Lv  2 Term 05/22/95    F Vag-Spont   LIV  1 Term 03/22/91    M Vag-Spont   LIV    Past Medical History:  Diagnosis Date   Allergy    Arthritis    osteoarthritis - fingers   Asthma    Chicken pox    Chronic UTI    Cystocele    2nd degree   Endometriosis    Family history of polyps in the colon    Graves disease    History of heavy periods    Hole in the ear drum, right    Hyperlipemia    IBS (irritable bowel syndrome)    Perimenopausal    PONV (postoperative nausea and vomiting)    nausea only   Rectocele    mild   S/P endometrial ablation    Scoliosis    Skin cancer    SUI (stress urinary incontinence, female)    SVT (supraventricular tachycardia) (HCC)    Urine incontinence    Wears contact lenses    Wears hearing aid in both ears     Family  History  Problem Relation Age of Onset   Cancer Mother    Hyperlipidemia Mother    Heart disease Mother    Hypertension Mother    Breast cancer Mother 69   Kidney failure Mother    Stroke Mother    Cancer Father        colon   Hyperlipidemia Father    Heart disease Father    Hypertension Father    Breast cancer Maternal Aunt 62   Breast cancer Paternal Aunt 86   Kidney cancer Neg Hx    Bladder Cancer Neg Hx    Prostate cancer Neg Hx    Ovarian cancer Neg Hx    Diabetes Neg Hx     Past Surgical History:  Procedure Laterality Date   CATARACT EXTRACTION W/PHACO Right 11/03/2021   Procedure: CATARACT EXTRACTION PHACO AND INTRAOCULAR LENS PLACEMENT (IOC) RIGHT;  Surgeon: Galen Manila, MD;  Location: [facility];  Service: Ophthalmology;  Laterality: Right;  2.98 0:27.0   CATARACT EXTRACTION W/PHACO Left 12/22/2021   Procedure: CATARACT EXTRACTION PHACO AND INTRAOCULAR LENS PLACEMENT (IOC) LEFT 2.38 00:26.0;  Surgeon: Galen Manila, MD;  Location: [facility];  Service: Ophthalmology;  Laterality: Left;   COLONOSCOPY WITH PROPOFOL N/A 07/10/2018   Procedure: COLONOSCOPY WITH PROPOFOL;  Surgeon: Scot Jun, MD;  Location: [facility] ENDOSCOPY;  Service: Endoscopy;  Laterality: N/A;   COLONOSCOPY WITH PROPOFOL N/A 07/11/2023   Procedure: COLONOSCOPY WITH PROPOFOL;  Surgeon: Wyline Mood, MD;  Location: [facility] END[facility]COPY;  Service: Gastroenterology;  Laterality: N/A;   endometrrial ablation     EYE SURGERY     knee athroscopy Left 2013   laprascopic surgery     endometreosis   NASOPHARYNGOSCOPY EUSTATION TUBE BALLOON DILATION Bilateral 05/18/2018   Procedure: NASOPHARYNGOSCOPY EUSTATION TUBE BALLOON DILATION OUTFRACTURE BILATERAL INFERIOR TURBINATES;  Surgeon: Vernie Murders, MD;  Location: [facility];  Service: ENT;  Laterality: Bilateral;   NASOPHARYNGOSCOPY EUSTATION TUBE BALLOON DILATION     nova-sure     POLYPECTOMY  07/11/2023   Procedure:  POLYPECTOMY;  Surgeon: Wyline Mood, MD;  Location: [facility] ENDOSCOPY;  Service: Gastroenterology;;   THYROIDECTOMY, PARTIAL     TONSILLECTOMY AND ADENOIDECTOMY     tubes in ears      Social History   Socioeconomic History   Marital status: Married    Spouse name: Not on file   Number of children: Not on file   Years of education: Not on file   Highest education level: Not on file  Occupational History   Occupation: [employer]  Tobacco Use   Smoking status: Never    Passive exposure: Never   Smokeless tobacco: Never  Vaping Use   Vaping status: Never Used  Substance and Sexual Activity   Alcohol use: No    Alcohol/week: 0.0 standard drinks of alcohol   Drug use: No   Sexual activity: Yes    Birth control/protection: Post-menopausal  Other Topics Concern   Not on file  Social History Narrative   Not on file   Social Drivers of Health   Financial Resource Strain: Low Risk  (05/18/2023)   Received from [facility]   Overall Financial Resource Strain (CARDIA)    Difficulty of Paying Living Expenses: Not hard at all  Food Insecurity: No Food Insecurity (05/18/2023)   Received from [facility]   Hunger Vital Sign    Worried About Running Out of Food in the Last Year: Never true    Ran Out of Food in the Last Year: Never true  Transportation Needs: No Transportation Needs (05/18/2023)   Received from [facility] - Transportation    In the past 12 months, has lack of transportation kept you from medical appointments or from getting medications?: No    Lack of Transportation (Non-Medical): No  Physical Activity: Sufficiently Active (04/19/2018)   Exercise Vital Sign    Days of Exercise per Week: 4 days    Minutes of Exercise per Session: 40 min  Stress: Not on file  Social Connections: Not on file  Intimate Partner Violence: Not on file    Current Outpatient Medications on File Prior to Visit  Medication  Sig Dispense Refill   albuterol (VENTOLIN HFA) 108 (90 Base) MCG/ACT inhaler Inhale 2 inhalations into the lungs every 4 (four) hours as needed for Wheezing 18 g 1   cholecalciferol (VITAMIN D) 1000 UNITS tablet Take 1,000 Units by mouth daily.     EPINEPHrine 0.3 mg/0.3 mL IJ SOAJ injection Inject 0.3 mg into the muscle as needed for anaphylaxis.     EPINEPHrine 0.3 mg/0.3 mL IJ SOAJ injection Inject 0.3 mLs (0.3 mg total) into the muscle once as needed for up to 1 dose. 2 each 1   fexofenadine (ALLEGRA) 180 MG tablet Allegra Allergy  180 mg tablet  Take 1 tablet every day by oral route.     fluticasone (FLONASE) 50 MCG/ACT nasal spray Place 2 sprays into both nostrils once daily as needed 48 g 1   fluticasone (FLONASE) 50 MCG/ACT nasal spray Spray 2 sprays into both nostrils once daily as needed. 48 g 1   imipramine (TOFRANIL) 25 MG tablet Take 2 tablets (50 mg total) by mouth at bedtime. 180 tablet 1   imipramine (TOFRANIL) 25 MG tablet Take 2 tablets (50 mg total) by mouth at bedtime. 200 tablet 1   levothyroxine (SYNTHROID) 100 MCG tablet Take 1 tablet (100 mcg total) by mouth daily. 90 tablet 1   lovastatin (MEVACOR) 20 MG tablet Take 1 tablet (20 mg total) by mouth daily with supper. 90 tablet 1   metoprolol succinate (TOPROL-XL) 50 MG 24 hr tablet Take 1 tablet (50 mg total) by mouth daily. 90 tablet 3   montelukast (SINGULAIR) 10 MG tablet Take 1 tablet (10 mg total) by mouth at bedtime 90 tablet 1   Multiple Vitamins-Minerals (MULTIVITAMIN ADULT) CHEW Chew 1 tablet by mouth daily.     nitrofurantoin (MACRODANTIN) 50 MG capsule Take 1 capsule (50 mg total) by mouth daily. 90 capsule 1   No current facility-administered medications on file prior to visit.    Allergies  Allergen Reactions   Ciprofloxacin Anaphylaxis   Floxin [Ofloxacin] Anaphylaxis   Levofloxacin Anaphylaxis   Quinolones Anaphylaxis   Amoxicillin     fever   Bextra [Valdecoxib]    Gyne-Lotrimin [Clotrimazole]      redness   Penicillins Rash   Septra [Sulfamethoxazole-Trimethoprim] Rash    Flushed and fever.   Sulfa Antibiotics Rash    fever      Review of Systems ROS Review of Systems - General ROS: negative for - chills, fatigue, fever, hot flashes, night sweats, weight gain or weight loss Psychological ROS: negative for - anxiety, decreased libido, depression, mood swings, physical abuse or sexual abuse Ophthalmic ROS: negative for - blurry vision, eye pain or loss of vision ENT ROS: negative for - headaches, hearing change, visual changes or vocal changes Allergy and Immunology ROS: negative for - hives, itchy/watery eyes or seasonal allergies Hematological and Lymphatic ROS: negative for - bleeding problems, bruising, swollen lymph nodes or weight loss Endocrine ROS: negative for - galactorrhea, hair pattern changes, hot flashes, malaise/lethargy, mood swings, palpitations, polydipsia/polyuria, skin changes, temperature intolerance or unexpected weight changes Breast ROS: negative for - new or changing breast lumps or nipple discharge Respiratory ROS: negative for - cough or shortness of breath Cardiovascular ROS: negative for - chest pain, irregular heartbeat, palpitations or shortness of breath Gastrointestinal ROS: no abdominal pain, change in bowel habits, or black or bloody stools Genito-Urinary ROS: no dysuria, trouble voiding, or hematuria Musculoskeletal ROS: negative for - joint pain or joint stiffness Neurological ROS: negative for - bowel and bladder control changes Dermatological ROS: negative for rash and skin lesion changes   Objective:   LMP 07/01/2014 (Exact Date)  CONSTITUTIONAL: Well-developed, well-nourished female in no acute distress.  PSYCHIATRIC: Normal mood and affect. Normal behavior. Normal judgment and thought content. NEUROLGIC: Alert and oriented to person, place, and time. Normal muscle tone coordination. No cranial nerve deficit noted. HENT:  Normocephalic,  atraumatic, External right and left ear normal. Oropharynx is clear and moist EYES: Conjunctivae and EOM are normal. Pupils are equal, round, and reactive to light. No scleral icterus.  NECK: Normal range of motion, supple, no masses.  Normal  thyroid.  SKIN: Skin is warm and dry. No rash noted. Not diaphoretic. No erythema. No pallor. CARDIOVASCULAR: Normal heart rate noted, regular rhythm, no murmur. RESPIRATORY: Clear to auscultation bilaterally. Effort and breath sounds normal, no problems with respiration noted. BREASTS: Symmetric in size. No masses, skin changes, nipple drainage, or lymphadenopathy. ABDOMEN: Soft, normal bowel sounds, no distention noted.  No tenderness, rebound or guarding.  BLADDER: Normal PELVIC:  Bladder {:311640}  Urethra: {:311719}  Vulva: {:311722}  Vagina: {:311643}  Cervix: {:311644}  Uterus: {:311718}  Adnexa: {:311645}  RV: {Blank multiple:19196::"External Exam NormaI","No Rectal Masses","Normal Sphincter tone"}  MUSCULOSKELETAL: Normal range of motion. No tenderness.  No cyanosis, clubbing, or edema.  2+ distal pulses. LYMPHATIC: No Axillary, Supraclavicular, or Inguinal Adenopathy.   Labs: Lab Results  Component Value Date   WBC 6.3 06/09/2012   HGB 13.9 06/09/2012   HCT 40.5 06/09/2012   MCV 85 06/09/2012   PLT 241 06/09/2012    Lab Results  Component Value Date   CREATININE 0.61 06/09/2012   BUN 12 06/09/2012   NA 141 06/09/2012   K 4.4 06/09/2012   CL 105 06/09/2012   CO2 29 06/09/2012    Lab Results  Component Value Date   ALT 26 06/09/2012   AST 13 (L) 06/09/2012   ALKPHOS 68 06/09/2012   BILITOT 0.6 06/09/2012    Lab Results  Component Value Date   CHOL 187 06/09/2012   HDL 49 06/09/2012   LDLCALC 127 (H) 06/09/2012   TRIG 53 06/09/2012    Lab Results  Component Value Date   TSH 0.863 06/09/2012    No results found for: "HGBA1C"   Assessment:   No diagnosis found.   Plan:  Pap:  Ordered Mammogram:  Ordered Colon Screening:   UTD Labs:  Pending Routine preventative health maintenance measures emphasized:  Self Breast Exams, Exercise/Diet/Weight control, and Stress Management Flu vaccine status: COVID Vaccination status: Return to Clinic - 1 Year   Hildred Laser, MD [facility]

## 2023-08-10 ENCOUNTER — Other Ambulatory Visit (HOSPITAL_COMMUNITY)
Admission: RE | Admit: 2023-08-10 | Discharge: 2023-08-10 | Disposition: A | Discharge status: Home / Self Care | Payer: 59 | Source: Ambulatory Visit | Attending: Obstetrics and Gynecology | Admitting: Obstetrics and Gynecology

## 2023-08-10 ENCOUNTER — Ambulatory Visit (INDEPENDENT_AMBULATORY_CARE_PROVIDER_SITE_OTHER): Payer: 59 | Admitting: Obstetrics and Gynecology

## 2023-08-10 ENCOUNTER — Encounter: Payer: Self-pay | Admitting: Obstetrics and Gynecology

## 2023-08-10 VITALS — BP 113/73 | HR 99 | Resp 16 | Ht 62.0 in | Wt 125.0 lb

## 2023-08-10 DIAGNOSIS — Z01419 Encounter for gynecological examination (general) (routine) without abnormal findings: Secondary | ICD-10-CM | POA: Diagnosis not present

## 2023-08-10 DIAGNOSIS — N814 Uterovaginal prolapse, unspecified: Secondary | ICD-10-CM

## 2023-08-10 DIAGNOSIS — Z13220 Encounter for screening for lipoid disorders: Secondary | ICD-10-CM

## 2023-08-10 DIAGNOSIS — Z809 Family history of malignant neoplasm, unspecified: Secondary | ICD-10-CM

## 2023-08-10 DIAGNOSIS — Z1231 Encounter for screening mammogram for malignant neoplasm of breast: Secondary | ICD-10-CM

## 2023-08-10 DIAGNOSIS — E0500 Thyrotoxicosis with diffuse goiter without thyrotoxic crisis or storm: Secondary | ICD-10-CM

## 2023-08-10 DIAGNOSIS — R7303 Prediabetes: Secondary | ICD-10-CM

## 2023-08-10 DIAGNOSIS — Z124 Encounter for screening for malignant neoplasm of cervix: Secondary | ICD-10-CM

## 2023-08-10 DIAGNOSIS — E039 Hypothyroidism, unspecified: Secondary | ICD-10-CM

## 2023-08-16 LAB — CYTOLOGY - PAP
Comment: NEGATIVE
Diagnosis: NEGATIVE
Diagnosis: REACTIVE
High risk HPV: NEGATIVE

## 2023-09-08 DIAGNOSIS — R970 Elevated carcinoembryonic antigen [CEA]: Secondary | ICD-10-CM | POA: Diagnosis not present

## 2023-09-14 ENCOUNTER — Other Ambulatory Visit: Payer: Self-pay | Admitting: Family Medicine

## 2023-09-14 DIAGNOSIS — R970 Elevated carcinoembryonic antigen [CEA]: Secondary | ICD-10-CM

## 2023-09-14 NOTE — Progress Notes (Unsigned)
   Rubin Payor, PhD, LAT, ATC acting as a scribe for Clementeen Graham, MD.  [name] is a 63 y.o. female who presents to [facility] at [facility] today for forearm pain. Pt was previously seen by Dr. Denyse Amass in 2021-22 for R knee pain.  Today, pt c/o R***L forearm pain x ***. Pt locates pain to ***.  Radiates: Paresthesia: Grip strength: Aggravates: Treatments tried:  Pertinent review of systems: ***  Relevant historical information: ***   Exam:  LMP 07/01/2014 (Exact Date)  General: Well Developed, well nourished, and in no acute distress.   MSK: ***    Lab and Radiology Results No results found for this or any previous visit (from the past 72 hours). No results found.     Assessment and Plan: 63 y.o. female with ***   PDMP not reviewed this encounter. No orders of the defined types were placed in this encounter.  No orders of the defined types were placed in this encounter.    Discussed warning signs or symptoms. Please see discharge instructions. Patient expresses understanding.   ***

## 2023-09-15 ENCOUNTER — Ambulatory Visit
Admission: RE | Admit: 2023-09-15 | Discharge: 2023-09-15 | Disposition: A | Discharge status: Home / Self Care | Payer: 59 | Source: Ambulatory Visit | Attending: Family Medicine | Admitting: Family Medicine

## 2023-09-15 ENCOUNTER — Encounter: Payer: Self-pay | Admitting: Family Medicine

## 2023-09-15 ENCOUNTER — Ambulatory Visit (INDEPENDENT_AMBULATORY_CARE_PROVIDER_SITE_OTHER): Payer: 59 | Admitting: Family Medicine

## 2023-09-15 ENCOUNTER — Other Ambulatory Visit: Payer: Self-pay

## 2023-09-15 VITALS — BP 122/84 | HR 122 | Ht 62.0 in | Wt 125.0 lb

## 2023-09-15 DIAGNOSIS — R970 Elevated carcinoembryonic antigen [CEA]: Secondary | ICD-10-CM | POA: Diagnosis not present

## 2023-09-15 DIAGNOSIS — M7711 Lateral epicondylitis, right elbow: Secondary | ICD-10-CM | POA: Diagnosis not present

## 2023-09-15 DIAGNOSIS — N854 Malposition of uterus: Secondary | ICD-10-CM | POA: Diagnosis not present

## 2023-09-15 DIAGNOSIS — Z800 Family history of malignant neoplasm of digestive organs: Secondary | ICD-10-CM | POA: Diagnosis not present

## 2023-09-15 DIAGNOSIS — M79601 Pain in right arm: Secondary | ICD-10-CM

## 2023-09-15 DIAGNOSIS — Z803 Family history of malignant neoplasm of breast: Secondary | ICD-10-CM | POA: Diagnosis not present

## 2023-09-15 DIAGNOSIS — G5601 Carpal tunnel syndrome, right upper limb: Secondary | ICD-10-CM | POA: Diagnosis not present

## 2023-09-15 DIAGNOSIS — N858 Other specified noninflammatory disorders of uterus: Secondary | ICD-10-CM | POA: Diagnosis not present

## 2023-09-15 NOTE — Patient Instructions (Signed)
Thank you for coming in today.   Ok to use tylenol and ibuprofen. OK to use voltarn gel.   Use the carpal tunnel braces especially at bedtime.   Plan for OT.   Ok to use tennis elbow strap and or a thumb loop wrist strap.   If not better next step would typically be injection and or nerve conduction study.   Typically would recommend a 6 week course of hand therapy.

## 2023-09-22 ENCOUNTER — Encounter: Payer: Self-pay | Admitting: Occupational Therapy

## 2023-09-22 ENCOUNTER — Ambulatory Visit: Payer: 59 | Attending: Family Medicine | Admitting: Occupational Therapy

## 2023-09-22 DIAGNOSIS — M25521 Pain in right elbow: Secondary | ICD-10-CM | POA: Diagnosis not present

## 2023-09-22 DIAGNOSIS — G5601 Carpal tunnel syndrome, right upper limb: Secondary | ICD-10-CM | POA: Insufficient documentation

## 2023-09-22 DIAGNOSIS — M79601 Pain in right arm: Secondary | ICD-10-CM | POA: Insufficient documentation

## 2023-09-22 DIAGNOSIS — M7711 Lateral epicondylitis, right elbow: Secondary | ICD-10-CM | POA: Insufficient documentation

## 2023-09-22 NOTE — Therapy (Signed)
OUTPATIENT OCCUPATIONAL THERAPY ORTHO EVALUATION  Patient Name: [name] MRN: [id number] DOB:1961/06/27, 63 y.o., female Today's Date: 09/22/2023  PCP: Dr Burnadette Pop REFERRING PROVIDER: Dr Denyse Amass  END OF SESSION:  OT End of Session - 09/22/23 1329     Visit Number 1    Number of Visits 12    Date for OT Re-Evaluation 11/17/23    OT Start Time 0819    OT Stop Time 0902    OT Time Calculation (min) 43 min    Activity Tolerance Patient tolerated treatment well    Behavior During Therapy WFL for tasks assessed/performed             Past Medical History:  Diagnosis Date   Allergy    Arthritis    osteoarthritis - fingers   Asthma    Chicken pox    Chronic UTI    Cystocele    2nd degree   Endometriosis    Family history of polyps in the colon    Graves disease    History of heavy periods    Hole in the ear drum, right    Hyperlipemia    IBS (irritable bowel syndrome)    Perimenopausal    PONV (postoperative nausea and vomiting)    nausea only   Rectocele    mild   S/P endometrial ablation    Scoliosis    Skin cancer    SUI (stress urinary incontinence, female)    SVT (supraventricular tachycardia) (HCC)    Urine incontinence    Wears contact lenses    Wears hearing aid in both ears    Past Surgical History:  Procedure Laterality Date   CATARACT EXTRACTION W/PHACO Right 11/03/2021   Procedure: CATARACT EXTRACTION PHACO AND INTRAOCULAR LENS PLACEMENT (IOC) RIGHT;  Surgeon: Galen Manila, MD;  Location: [facility];  Service: Ophthalmology;  Laterality: Right;  2.98 0:27.0   CATARACT EXTRACTION W/PHACO Left 12/22/2021   Procedure: CATARACT EXTRACTION PHACO AND INTRAOCULAR LENS PLACEMENT (IOC) LEFT 2.38 00:26.0;  Surgeon: Galen Manila, MD;  Location: [facility];  Service: Ophthalmology;  Laterality: Left;   COLONOSCOPY WITH PROPOFOL N/A 07/10/2018   Procedure: COLONOSCOPY WITH PROPOFOL;  Surgeon: Scot Jun, MD;  Location:  [facility] END[facility]COPY;  Service: Endoscopy;  Laterality: N/A;   COLONOSCOPY WITH PROPOFOL N/A 07/11/2023   Procedure: COLONOSCOPY WITH PROPOFOL;  Surgeon: Wyline Mood, MD;  Location: [facility] ENDOSCOPY;  Service: Gastroenterology;  Laterality: N/A;   endometrrial ablation     EYE SURGERY     knee athroscopy Left 2013   laprascopic surgery     endometreosis   NASOPHARYNGOSCOPY EUSTATION TUBE BALLOON DILATION Bilateral 05/18/2018   Procedure: NASOPHARYNGOSCOPY EUSTATION TUBE BALLOON DILATION OUTFRACTURE BILATERAL INFERIOR TURBINATES;  Surgeon: Vernie Murders, MD;  Location: [facility];  Service: ENT;  Laterality: Bilateral;   NASOPHARYNGOSCOPY EUSTATION TUBE BALLOON DILATION     nova-sure     POLYPECTOMY  07/11/2023   Procedure: POLYPECTOMY;  Surgeon: Wyline Mood, MD;  Location: [facility] ENDOSCOPY;  Service: Gastroenterology;;   THYROIDECTOMY, PARTIAL     TONSILLECTOMY AND ADENOIDECTOMY     tubes in ears     Patient Active Problem List   Diagnosis Date Noted   Adenomatous polyp of colon 07/11/2023   Family history of colon cancer in father 07/11/2023   Allergy to environmental factors 08/22/2019   Disorder of urinary tract 08/22/2019   Hypertensive disorder 08/22/2019   Irregular heart beat 08/22/2019   Osteoarthritis 07/24/2019   Generalized osteoarthritis of hand  07/18/2019   Encounter for orthopedic follow-up care 10/10/2018   Digital mucous cyst 07/12/2018   History of Graves' disease 06/14/2018   Pure hypercholesterolemia 06/14/2018   Rhinitis 06/14/2018   Rosacea 06/14/2018   Personal history of colon polyps, unspecified 05/09/2018   Midline cystocele 04/19/2018   Chest pain with low risk for cardiac etiology 10/26/2017   Borderline diabetes mellitus 11/05/2015   Chronic UTI 03/21/2015   SVT (supraventricular tachycardia) (HCC) 03/21/2015   Endometriosis 03/21/2015   Asthma 03/21/2015   Graves disease 03/21/2015   Irritable bowel syndrome 03/21/2015   Family history of breast  cancer 03/21/2015   Stool incontinence 03/21/2015   Gastrocnemius tear 01/18/2014   Rupture of patellar tendon 01/18/2014   Bladder infection, chronic 05/24/2012   Incomplete emptying of bladder 05/24/2012   Mixed urge and stress incontinence 05/24/2012    ONSET DATE: 2 months ago  REFERRING DIAG: Right lateral epicondylitis  THERAPY DIAG:  Pain in right elbow  Lateral epicondylitis of right elbow  Rationale for Evaluation and Treatment: Rehabilitation  SUBJECTIVE:   SUBJECTIVE STATEMENT: The R elbow pain started probably about 2 months ago.  I have tried KT taping I tried a wrist brace I tried Voltaren ointment I tried a lot of stuff.  And is still going on.  The only thing is I did start a new job  a year ago.  I teaches once a month CPR.  I have a 45-year-old grandson that I pick up Pt accompanied by: self  PERTINENT HISTORY:  09/15/23: Dr Denyse Amass note - Assessment and Plan: 63 y.o. female with right lateral elbow pain due to lateral epicondylitis.  Plan to refer to occupational therapy and recommend counterforce brace or wrist brace.  If not improved consider steroid injection.  Recheck after 6 weeks of occupational therapy.   Right carpal tunnel syndrome present as well.  Plan for night splint.  Plan for occupational    PRECAUTIONS: None     WEIGHT BEARING RESTRICTIONS: No  PAIN:  Are you having pain? Resting 0/10 and increase 4-7/10 tenderness, gripping, twisting, reaching and lifting   FALLS: Has patient fallen in last 6 months? 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  Patient mostly in the computer as well as taking blood pressures and walk around the factories floors as well as teaching CPR once a month -does her own cooking and housework,  has a 75-year-old grandson, play some Piano; walks for exercise  PATIENT GOALS: Get the pain better in my right elbow so that I can do  things  NEXT MD VISIT: 4 to 6 weeks after therapy  OBJECTIVE:  Note: Objective measures were completed at Evaluation unless otherwise noted.  HAND DOMINANCE: Right  ADLs: Pain in right elbow with squeezing, gripping, twisting, reaching, lifting, pushing and pulling    UPPER EXTREMITY ROM:     Active ROM Right eval Left eval  Shoulder flexion    Shoulder abduction    Shoulder adduction    Shoulder extension    Shoulder internal rotation    Shoulder external rotation    Elbow flexion    Elbow extension Pain with reaching   Wrist flexion 65   Wrist extension 65pain   Wrist ulnar deviation 30   Wrist radial deviation 20   Wrist pronation Pain    Wrist supination Click and pain   (Blank rows = not tested)  Fisting  and thumb AROM WNL - Opposition to base of 5th WNL    Grip strength: Right: 50 extended arm 50 - both pain  lbs; Left: 52 extended arm 52 lbs, Lateral pinch: Right: 12 lbs, Left: 13 lbs, and 3 point pinch: Right: 11 some pain  lbs, Left: 10 lbs  COORDINATION: WFL  SENSATION: Night time numbness ; ? CTS  EDEMA: none notice  COGNITION: Overall cognitive status: Within functional limits for tasks assessed      TREATMENT DATE: 09/22/23                                                                                                                            Patient educated on doing morning and evening heat to right elbow Soft Tissue massage to lateral epicondyle as well as forearm-circular back-and-forth up-and-down but 2 to 3 minutes Followed by elbow to side -  gentle wrist flexion loose fist in neutral 5 reps hold 5 sec  and pronation position  5 reps hold 5 seconds Gentle pull less than a 1/10 Followed by ice massage over the lateral epicondyle of the right  Patient was educated and correct application and wearing of counterforce strap Patient had less pain Patient educated on modifications using palms or forearms in neutral to pick up or hold  or carry objects keeping it close to the body, avoid reaching out with extended arm. Patient works more than not harder Light gripping of objects    PATIENT EDUCATION: Education details: findings of eval and HEP  Person educated: Patient Education method: Explanation, Demonstration, Tactile cues, Verbal cues, and Handouts Education comprehension: verbalized understanding, returned demonstration, verbal cues required, and needs further education    GOALS: Goals reviewed with patient? Yes  SHORT TERM GOALS: Target date: 2 wks   Patient to be independent in home program to decrease tenderness at lateral epicondyle as well as increase doing home exercises symptom-free Baseline: Tenderness 5/10 at lateral epicondyle.  Pain with wrist flexion with elbow to side, increased pain in the morning as well as use/7/10 Goal status: INITIAL   LONG TERM GOALS: Target date: 6 wks  Pain right elbow decreased with active range of motion for patient to initiate strengthening Baseline: Patient has pain with gripping, twisting, reaching, pulling and pushing -pain can be 4-7/10 Goal status: INITIAL  2.  Patient verbalize and use 3 modifications to decrease symptoms and pain in right lateral epicondyle Baseline: No knowledge of modifications for work. Goal status: INITIAL  3.  Right grip and prehension strength increased to within normal limits for her age symptom-free Baseline: Pain with grip and 3-point pinch at lateral epicondyle.  Patient right-hand-dominant.  Right grip 50 pounds left 52 pounds with extended arm same with increased pain Goal status: INITIAL     ASSESSMENT:  CLINICAL IMPRESSION: Patient seen today for occupational therapy evaluation for right dominant hand lateral epicondylitis.  Patient reported started about 2 months ago.  Patient works as a occupational  health nurse at a factory.  Changed to that position about a year ago.  Patient do teach CPR once a month.  Take blood  pressure, play piano and has a 26-year-old grandson that she picks up.  Patient's resting pain 0/10.  With pain and tenderness that can increase to 4-7/10 at the right lateral epicondyle into the extensor muscle.  Patient positive test for LAT epicondylitis with tenderness, positive test for resistance to middle finger as well as wrist extension.  Pain with supination and gripping.  Pain increases with extended arm.  Patient do have a trigger point on the forearm flexors.  With some clicking at the lateral epicondyle with supination.  Patient limited because of above impairment in functional use of right dominant hand in ADLs and IADLs.  Patient can benefit from skilled OT services to decrease pain, increase motion and strength symptom-free and pain-free for patient to return to prior level of function.  PERFORMANCE DEFICITS: in functional skills including ADLs, IADLs, ROM, strength, pain, flexibility, decreased knowledge of use of DME, and UE functional use,   and psychosocial skills including environmental adaptation and routines and behaviors.   IMPAIRMENTS: are limiting patient from ADLs, IADLs, rest and sleep, play, leisure, and social participation.   COMORBIDITIES: has no other co-morbidities that affects occupational performance. Patient will benefit from skilled OT to address above impairments and improve overall function.  MODIFICATION OR ASSISTANCE TO COMPLETE EVALUATION: No modification of tasks or assist necessary to complete an evaluation.  OT OCCUPATIONAL PROFILE AND HISTORY: Problem focused assessment: Including review of records relating to presenting problem.  CLINICAL DECISION MAKING: LOW - limited treatment options, no task modification necessary  REHAB POTENTIAL: Good for goals  EVALUATION COMPLEXITY: Low    PLAN:  OT FREQUENCY: 2x/week  OT DURATION: 8 weeks  PLANNED INTERVENTIONS: 97168 OT Re-evaluation, 97535 self care/ADL training, 16109 therapeutic exercise, 97530  therapeutic activity, 97140 manual therapy, 97035 ultrasound, 97034 contrast bath, 97033 iontophoresis, 97760 Orthotics management and training, 60454 Splinting (initial encounter), passive range of motion, energy conservation, patient/family education, and DME and/or AE instructions    CONSULTED AND AGREED WITH PLAN OF CARE: Patient     Oletta Cohn, OTR/L,CLT 09/22/2023, 1:33 PM

## 2023-09-23 ENCOUNTER — Encounter: Payer: Self-pay | Admitting: Family Medicine

## 2023-09-27 ENCOUNTER — Ambulatory Visit: Payer: 59 | Attending: Family Medicine | Admitting: Occupational Therapy

## 2023-09-27 ENCOUNTER — Inpatient Hospital Stay: Payer: 59

## 2023-09-27 ENCOUNTER — Inpatient Hospital Stay: Payer: 59 | Attending: Oncology | Admitting: Oncology

## 2023-09-27 ENCOUNTER — Encounter: Payer: Self-pay | Admitting: Oncology

## 2023-09-27 VITALS — BP 127/97 | HR 88 | Temp 97.6°F | Resp 18 | Ht 62.0 in | Wt 125.3 lb

## 2023-09-27 DIAGNOSIS — M7711 Lateral epicondylitis, right elbow: Secondary | ICD-10-CM | POA: Diagnosis present

## 2023-09-27 DIAGNOSIS — R970 Elevated carcinoembryonic antigen [CEA]: Secondary | ICD-10-CM | POA: Insufficient documentation

## 2023-09-27 DIAGNOSIS — Z803 Family history of malignant neoplasm of breast: Secondary | ICD-10-CM | POA: Diagnosis not present

## 2023-09-27 DIAGNOSIS — Z79899 Other long term (current) drug therapy: Secondary | ICD-10-CM | POA: Insufficient documentation

## 2023-09-27 DIAGNOSIS — Z809 Family history of malignant neoplasm, unspecified: Secondary | ICD-10-CM | POA: Diagnosis not present

## 2023-09-27 DIAGNOSIS — Z800 Family history of malignant neoplasm of digestive organs: Secondary | ICD-10-CM | POA: Insufficient documentation

## 2023-09-27 DIAGNOSIS — M25521 Pain in right elbow: Secondary | ICD-10-CM | POA: Diagnosis present

## 2023-09-27 NOTE — Progress Notes (Signed)
 Hematology/Oncology Consult note [facility] [phone] [phone]  Patient Care Team: Alla Amis, MD as PCP - General (Family Medicine)   Name of the patient: [name]  [id number]  [birth year]    Reason for referral-elevated CEA   Referring physician-Dr. Alla  Date of visit: 09/27/23   History of presenting illness-patient is a 63 year old female with prior medical history significant for Graves' disease s/p partial thyroidectomy, family history of colon cancer in her father in his 50s breast cancer in her mother in the 17s.  She was in the process of trying to get long-term insurance for her and the insurance company checked her CEA which was elevated at 7. This was repeated by her primary care doctor and 09/08/2023 she was found to have an elevated CEA of 8.3.  She is a non-smoker.  No personal history of any cancers.  She had a screening bilateral mammogram in December 2024 which was unremarkable.  She has had a colonoscopy in November 2024 which showed 3 mm polyp in the cecum that was resected and 2 other polyps in the transverse colon which were resected and all of them were negative for dysplasia or malignancy.  Patient denies any changes in her bowel habits or blood in stool.  Appetite and weight have remained stable.  ECOG PS- 0  Pain scale- 3   Review of systems- Review of Systems  Constitutional:  Negative for chills, fever, malaise/fatigue and weight loss.  HENT:  Negative for congestion, ear discharge and nosebleeds.   Eyes:  Negative for blurred vision.  Respiratory:  Negative for cough, hemoptysis, sputum production, shortness of breath and wheezing.   Cardiovascular:  Negative for chest pain, palpitations, orthopnea and claudication.  Gastrointestinal:  Negative for abdominal pain, blood in stool, constipation, diarrhea, heartburn, melena, nausea and vomiting.  Genitourinary:  Negative for dysuria, flank  pain, frequency, hematuria and urgency.  Musculoskeletal:  Negative for back pain, joint pain and myalgias.  Skin:  Negative for rash.  Neurological:  Negative for dizziness, tingling, focal weakness, seizures, weakness and headaches.  Endo/Heme/Allergies:  Does not bruise/bleed easily.  Psychiatric/Behavioral:  Negative for depression and suicidal ideas. The patient does not have insomnia.     Allergies  Allergen Reactions   Ciprofloxacin Anaphylaxis   Floxin [Ofloxacin] Anaphylaxis   Levofloxacin Anaphylaxis   Quinolones Anaphylaxis   Amoxicillin     fever   Bextra [Valdecoxib]    Gyne-Lotrimin [Clotrimazole]     redness   Penicillins Rash   Septra [Sulfamethoxazole-Trimethoprim ] Rash    Flushed and fever.   Sulfa Antibiotics Rash    fever    Patient Active Problem List   Diagnosis Date Noted   Adenomatous polyp of colon 07/11/2023   Family history of colon cancer in father 07/11/2023   Allergy to environmental factors 08/22/2019   Disorder of urinary tract 08/22/2019   Hypertensive disorder 08/22/2019   Irregular heart beat 08/22/2019   Osteoarthritis 07/24/2019   Generalized osteoarthritis of hand 07/18/2019   Encounter for orthopedic follow-up care 10/10/2018   Digital mucous cyst 07/12/2018   History of Graves' disease 06/14/2018   Pure hypercholesterolemia 06/14/2018   Rhinitis 06/14/2018   Rosacea 06/14/2018   Personal history of colon polyps, unspecified 05/09/2018   Midline cystocele 04/19/2018   Chest pain with low risk for cardiac etiology 10/26/2017   Borderline diabetes mellitus 11/05/2015   Chronic UTI 03/21/2015   SVT (supraventricular tachycardia) (HCC) 03/21/2015   Endometriosis 03/21/2015   Asthma 03/21/2015  Graves disease 03/21/2015   Irritable bowel syndrome 03/21/2015   Family history of breast cancer 03/21/2015   Stool incontinence 03/21/2015   Gastrocnemius tear 01/18/2014   Rupture of patellar tendon 01/18/2014   Bladder infection,  chronic 05/24/2012   Incomplete emptying of bladder 05/24/2012   Mixed urge and stress incontinence 05/24/2012     Past Medical History:  Diagnosis Date   Allergy    Arthritis    osteoarthritis - fingers   Asthma    Chicken pox    Chronic UTI    Cystocele    2nd degree   Endometriosis    Family history of polyps in the colon    Graves disease    History of heavy periods    Hole in the ear drum, right    Hyperlipemia    IBS (irritable bowel syndrome)    Perimenopausal    PONV (postoperative nausea and vomiting)    nausea only   Rectocele    mild   S/P endometrial ablation    Scoliosis    Skin cancer    SUI (stress urinary incontinence, female)    SVT (supraventricular tachycardia) (HCC)    Urine incontinence    Wears contact lenses    Wears hearing aid in both ears      Past Surgical History:  Procedure Laterality Date   CATARACT EXTRACTION W/PHACO Right 11/03/2021   Procedure: CATARACT EXTRACTION PHACO AND INTRAOCULAR LENS PLACEMENT (IOC) RIGHT;  Surgeon: Jaye Fallow, MD;  Location: [facility];  Service: Ophthalmology;  Laterality: Right;  2.98 0:27.0   CATARACT EXTRACTION W/PHACO Left 12/22/2021   Procedure: CATARACT EXTRACTION PHACO AND INTRAOCULAR LENS PLACEMENT (IOC) LEFT 2.38 00:26.0;  Surgeon: Jaye Fallow, MD;  Location: [facility];  Service: Ophthalmology;  Laterality: Left;   COLONOSCOPY WITH PROPOFOL  N/A 07/10/2018   Procedure: COLONOSCOPY WITH PROPOFOL ;  Surgeon: Viktoria Lamar DASEN, MD;  Location: [facility];  Service: Endoscopy;  Laterality: N/A;   COLONOSCOPY WITH PROPOFOL  N/A 07/11/2023   Procedure: COLONOSCOPY WITH PROPOFOL ;  Surgeon: Therisa Bi, MD;  Location: [facility] END[facility];  Service: Gastroenterology;  Laterality: N/A;   endometrrial ablation     EYE SURGERY     knee athroscopy Left 2013   laprascopic surgery     endometreosis   NASOPHARYNGOSCOPY EUSTATION TUBE BALLOON DILATION Bilateral 05/18/2018   Procedure:  NASOPHARYNGOSCOPY EUSTATION TUBE BALLOON DILATION OUTFRACTURE BILATERAL INFERIOR TURBINATES;  Surgeon: Edda Mt, MD;  Location: [facility];  Service: ENT;  Laterality: Bilateral;   NASOPHARYNGOSCOPY EUSTATION TUBE BALLOON DILATION     nova-sure     POLYPECTOMY  07/11/2023   Procedure: POLYPECTOMY;  Surgeon: Therisa Bi, MD;  Location: [facility] ENDOSCOPY;  Service: Gastroenterology;;   THYROIDECTOMY, PARTIAL     TONSILLECTOMY AND ADENOIDECTOMY     tubes in ears      Social History   Socioeconomic History   Marital status: Married    Spouse name: Not on file   Number of children: Not on file   Years of education: Not on file   Highest education level: Not on file  Occupational History   Occupation: [employer]  Tobacco Use   Smoking status: Never    Passive exposure: Never   Smokeless tobacco: Never  Vaping Use   Vaping status: Never Used  Substance and Sexual Activity   Alcohol use: No    Alcohol/week: 0.0 standard drinks of alcohol   Drug use: No   Sexual activity: Yes    Birth control/protection: Post-menopausal  Other Topics Concern   Not on file  Social History Narrative   Patient is a engineer, civil (consulting) :)   Social Drivers of Health   Financial Resource Strain: Low Risk  (05/18/2023)   Received from [facility]   Overall Financial Resource Strain (CARDIA)    Difficulty of Paying Living Expenses: Not hard at all  Food Insecurity: No Food Insecurity (09/27/2023)   Hunger Vital Sign    Worried About Running Out of Food in the Last Year: Never true    Ran Out of Food in the Last Year: Never true  Transportation Needs: No Transportation Needs (09/27/2023)   PRAPARE - Administrator, Civil Service (Medical): No    Lack of Transportation (Non-Medical): No  Physical Activity: Sufficiently Active (04/19/2018)   Exercise Vital Sign    Days of Exercise per Week: 4 days    Minutes of Exercise per Session: 40 min  Stress: Not on file  Social  Connections: Not on file  Intimate Partner Violence: Not At Risk (09/27/2023)   Humiliation, Afraid, Rape, and Kick questionnaire    Fear of Current or Ex-Partner: No    Emotionally Abused: No    Physically Abused: No    Sexually Abused: No     Family History  Problem Relation Age of Onset   Cancer Mother    Hyperlipidemia Mother    Heart disease Mother    Hypertension Mother    Breast cancer Mother 32   Kidney failure Mother    Stroke Mother    Cancer Father        colon   Hyperlipidemia Father    Heart disease Father    Hypertension Father    Breast cancer Maternal Aunt 64   Breast cancer Paternal Aunt 63   Kidney cancer Neg Hx    Bladder Cancer Neg Hx    Prostate cancer Neg Hx    Ovarian cancer Neg Hx    Diabetes Neg Hx      Current Outpatient Medications:    albuterol  (VENTOLIN  HFA) 108 (90 Base) MCG/ACT inhaler, Inhale 2 inhalations into the lungs every 4 (four) hours as needed for Wheezing, Disp: 18 g, Rfl: 1   cholecalciferol (VITAMIN D) 1000 UNITS tablet, Take 1,000 Units by mouth daily., Disp: , Rfl:    EPINEPHrine  0.3 mg/0.3 mL IJ SOAJ injection, Inject 0.3 mLs (0.3 mg total) into the muscle once as needed for up to 1 dose., Disp: 2 each, Rfl: 1   fexofenadine (ALLEGRA) 180 MG tablet, Allegra Allergy 180 mg tablet  Take 1 tablet every day by oral route., Disp: , Rfl:    fluticasone  (FLONASE ) 50 MCG/ACT nasal spray, Spray 2 sprays into both nostrils once daily as needed., Disp: 48 g, Rfl: 1   imipramine  (TOFRANIL ) 25 MG tablet, Take 2 tablets (50 mg total) by mouth at bedtime., Disp: 200 tablet, Rfl: 1   levothyroxine  (SYNTHROID ) 100 MCG tablet, Take 1 tablet (100 mcg total) by mouth daily., Disp: 90 tablet, Rfl: 1   lovastatin  (MEVACOR ) 20 MG tablet, Take 1 tablet (20 mg total) by mouth daily with supper., Disp: 90 tablet, Rfl: 1   metoprolol  succinate (TOPROL -XL) 50 MG 24 hr tablet, Take 1 tablet (50 mg total) by mouth daily., Disp: 90 tablet, Rfl: 3   montelukast   (SINGULAIR ) 10 MG tablet, Take 1 tablet (10 mg total) by mouth at bedtime, Disp: 90 tablet, Rfl: 1   Multiple Vitamins-Minerals (MULTIVITAMIN ADULT) CHEW, Chew 1 tablet by mouth  daily., Disp: , Rfl:    nitrofurantoin  (MACRODANTIN ) 50 MG capsule, Take 1 capsule (50 mg total) by mouth daily., Disp: 90 capsule, Rfl: 1   Physical exam:  Vitals:   09/27/23 1354  BP: (!) 127/97  Pulse: 88  Resp: 18  Temp: 97.6 F (36.4 C)  TempSrc: Tympanic  SpO2: 99%  Weight: 125 lb 4.8 oz (56.8 kg)  Height: 5' 2 (1.575 m)   Physical Exam Cardiovascular:     Rate and Rhythm: Normal rate and regular rhythm.     Heart sounds: Normal heart sounds.  Pulmonary:     Effort: Pulmonary effort is normal.     Breath sounds: Normal breath sounds.  Abdominal:     General: Bowel sounds are normal. There is no distension.     Palpations: Abdomen is soft.     Tenderness: There is no abdominal tenderness.     Comments: No palpable hepatosplenomegaly  Musculoskeletal:     Comments: Brace in place over right forearm  Lymphadenopathy:     Comments: No palpable cervical, supraclavicular, axillary or inguinal adenopathy    Skin:    General: Skin is warm and dry.  Neurological:     Mental Status: She is alert and oriented to person, place, and time.           Latest Ref Rng & Units 06/09/2012    8:18 AM  CMP  Glucose 65 - 99 mg/dL 86   BUN 7 - 18 mg/dL 12   Creatinine 9.39 - 1.30 mg/dL 9.38   Sodium 863 - 854 mmol/L 141   Potassium 3.5 - 5.1 mmol/L 4.4   Chloride 98 - 107 mmol/L 105   CO2 21 - 32 mmol/L 29   Calcium 8.5 - 10.1 mg/dL 8.6   Total Protein 6.4 - 8.2 g/dL 6.6   Total Bilirubin 0.2 - 1.0 mg/dL 0.6   Alkaline Phos 50 - 136 Unit/L 68   AST 15 - 37 Unit/L 13   ALT 12 - 78 U/L 26       Latest Ref Rng & Units 06/09/2012    8:18 AM  CBC  WBC 3.6 - 11.0 x10 3/mm 3 6.3   Hemoglobin 12.0 - 16.0 g/dL 86.0   Hematocrit 64.9 - 47.0 % 40.5   Platelets 150 - 440 x10 3/mm 3 241     No  images are attached to the encounter.  US  PELVIC COMPLETE WITH TRANSVAGINAL Result Date: 09/19/2023 : PROCEDURE: US  PELVIS COMPLETE WITH TRANSVAGINAL HISTORY: Patient is a 63 y/o F with elevated CEA level. Family history of colon cancer and breast cancer. COMPARISON: None. TECHNIQUE: Two-dimensional transabdominal grayscale and color Doppler ultrasound of the pelvis was performed. Transvaginal was performed. FINDINGS: The uterus is anteverted in position and measures 6.1 x 2.6 x 3.9 cm. It demonstrates a normal, homogeneous echotexture with a 0.3 cm shadowing calcification identified in the right uterine myometrium. The endometrium measures 0.3 cm and demonstrates a normal homogeneous echotexture. Small punctate echogenic foci are visualized endocervical canal The right ovary measures 1.9 x 1.2 x 1.4 cm and demonstrates a normal echotexture. There is normal color Doppler flow. The left ovary measures 2.0 x 0.8 x 1.1 cm and demonstrates a normal echotexture. There is normal color Doppler flow. There is no fluid present within the cul-de-sac. IMPRESSION: 1. Subcentimeter right uterine myometrial calcification. 2.  Small punctate endocervical canal nonshadowing, echogenic foci. Thank you for allowing us  to assist in the care of this patient. Electronically Signed  By: Lynwood Mains M.D.   On: 09/19/2023 20:27    Assessment and plan- Patient is a 63 y.o. female referred for elevated CEA  The reason for checking CEA as a screening tool by her insurance company is currently unclear.  Clinically patient does not have any signs and symptoms of malignancy.  She is a non-smoker.  CEA can be elevated in conditions such as colon cancer or breast pancreatic thyroid  or lung cancer.  She is up-to-date with her colonoscopy and mammograms.  I will get a CT abdomen and pelvis with contrast to rule out any malignancy and also to assess the status of liver and gallbladder as well as pancreas given the pancreatitis cholecystitis  or liver disorder such as cirrhosis can cause elevated CEA.  She has already had a ultrasound of her pelvis which did not show any evidence of malignancy.  Subcentimeter right uterine myometrial calcification.  The CT abdomen does not show any malignancy and inclined to monitor her CEA conservatively.  If there is a consistent rise in her CEA we could consider getting a CT chest to rule out conditions such as thyroid  and lung cancer.   Thank you for this kind referral and the opportunity to participate in the care of this patient   Visit Diagnosis 1. Elevated CEA   2. Family history of colon cancer in father   3. Family history of breast cancer     Dr. Annah Skene, MD, MPH [facility] [phone] 09/27/2023

## 2023-09-27 NOTE — Therapy (Signed)
 OUTPATIENT OCCUPATIONAL THERAPY ORTHO TREATMENT  Patient Name: [name] MRN: [id number] DOB:November 23, 1960, 63 y.o., female Today's Date: 09/27/2023  PCP: Dr Alla REFERRING PROVIDER: Dr Joane  END OF SESSION:  OT End of Session - 09/27/23 0857     Visit Number 2    Number of Visits 12    Date for OT Re-Evaluation 11/17/23    OT Start Time 0857    OT Stop Time 1003    OT Time Calculation (min) 66 min    Activity Tolerance Patient tolerated treatment well    Behavior During Therapy WFL for tasks assessed/performed             Past Medical History:  Diagnosis Date   Allergy    Arthritis    osteoarthritis - fingers   Asthma    Chicken pox    Chronic UTI    Cystocele    2nd degree   Endometriosis    Family history of polyps in the colon    Graves disease    History of heavy periods    Hole in the ear drum, right    Hyperlipemia    IBS (irritable bowel syndrome)    Perimenopausal    PONV (postoperative nausea and vomiting)    nausea only   Rectocele    mild   S/P endometrial ablation    Scoliosis    Skin cancer    SUI (stress urinary incontinence, female)    SVT (supraventricular tachycardia) (HCC)    Urine incontinence    Wears contact lenses    Wears hearing aid in both ears    Past Surgical History:  Procedure Laterality Date   CATARACT EXTRACTION W/PHACO Right 11/03/2021   Procedure: CATARACT EXTRACTION PHACO AND INTRAOCULAR LENS PLACEMENT (IOC) RIGHT;  Surgeon: Jaye Fallow, MD;  Location: [facility];  Service: Ophthalmology;  Laterality: Right;  2.98 0:27.0   CATARACT EXTRACTION W/PHACO Left 12/22/2021   Procedure: CATARACT EXTRACTION PHACO AND INTRAOCULAR LENS PLACEMENT (IOC) LEFT 2.38 00:26.0;  Surgeon: Jaye Fallow, MD;  Location: [facility];  Service: Ophthalmology;  Laterality: Left;   COLONOSCOPY WITH PROPOFOL  N/A 07/10/2018   Procedure: COLONOSCOPY WITH PROPOFOL ;  Surgeon: Viktoria Lamar DASEN, MD;  Location:  [facility] ENDOSCOPY;  Service: Endoscopy;  Laterality: N/A;   COLONOSCOPY WITH PROPOFOL  N/A 07/11/2023   Procedure: COLONOSCOPY WITH PROPOFOL ;  Surgeon: Therisa Bi, MD;  Location: [facility] ENDOSCOPY;  Service: Gastroenterology;  Laterality: N/A;   endometrrial ablation     EYE SURGERY     knee athroscopy Left 2013   laprascopic surgery     endometreosis   NASOPHARYNGOSCOPY EUSTATION TUBE BALLOON DILATION Bilateral 05/18/2018   Procedure: NASOPHARYNGOSCOPY EUSTATION TUBE BALLOON DILATION OUTFRACTURE BILATERAL INFERIOR TURBINATES;  Surgeon: Edda Mt, MD;  Location: [facility];  Service: ENT;  Laterality: Bilateral;   NASOPHARYNGOSCOPY EUSTATION TUBE BALLOON DILATION     nova-sure     POLYPECTOMY  07/11/2023   Procedure: POLYPECTOMY;  Surgeon: Therisa Bi, MD;  Location: [facility] ENDOSCOPY;  Service: Gastroenterology;;   THYROIDECTOMY, PARTIAL     TONSILLECTOMY AND ADENOIDECTOMY     tubes in ears     Patient Active Problem List   Diagnosis Date Noted   Adenomatous polyp of colon 07/11/2023   Family history of colon cancer in father 07/11/2023   Allergy to environmental factors 08/22/2019   Disorder of urinary tract 08/22/2019   Hypertensive disorder 08/22/2019   Irregular heart beat 08/22/2019   Osteoarthritis 07/24/2019   Generalized osteoarthritis of hand  07/18/2019   Encounter for orthopedic follow-up care 10/10/2018   Digital mucous cyst 07/12/2018   History of Graves' disease 06/14/2018   Pure hypercholesterolemia 06/14/2018   Rhinitis 06/14/2018   Rosacea 06/14/2018   Personal history of colon polyps, unspecified 05/09/2018   Midline cystocele 04/19/2018   Chest pain with low risk for cardiac etiology 10/26/2017   Borderline diabetes mellitus 11/05/2015   Chronic UTI 03/21/2015   SVT (supraventricular tachycardia) (HCC) 03/21/2015   Endometriosis 03/21/2015   Asthma 03/21/2015   Graves disease 03/21/2015   Irritable bowel syndrome 03/21/2015   Family history of breast  cancer 03/21/2015   Stool incontinence 03/21/2015   Gastrocnemius tear 01/18/2014   Rupture of patellar tendon 01/18/2014   Bladder infection, chronic 05/24/2012   Incomplete emptying of bladder 05/24/2012   Mixed urge and stress incontinence 05/24/2012    ONSET DATE: 2 months ago  REFERRING DIAG: Right lateral epicondylitis  THERAPY DIAG:  Pain in right elbow  Lateral epicondylitis of right elbow  Rationale for Evaluation and Treatment: Rehabilitation  SUBJECTIVE:   SUBJECTIVE STATEMENT: It is better but I have the click still at the elbow - every time rotating down -   Pt accompanied by: self  PERTINENT HISTORY:  09/15/23: Dr Joane note - Assessment and Plan: 63 y.o. female with right lateral elbow pain due to lateral epicondylitis.  Plan to refer to occupational therapy and recommend counterforce brace or wrist brace.  If not improved consider steroid injection.  Recheck after 6 weeks of occupational therapy.   Right carpal tunnel syndrome present as well.  Plan for night splint.  Plan for occupational    PRECAUTIONS: None     WEIGHT BEARING RESTRICTIONS: No  PAIN:  Are you having pain? Resting 5/10 tenderness and with twisting in pronation  FALLS: Has patient fallen in last 6 months? 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  HAND DOMINANCE: Right  ADLs: Pain in right elbow with squeezing, gripping, twisting, reaching, lifting,  pushing and pulling    UPPER EXTREMITY ROM:     Active ROM Right eval Left eval  Shoulder flexion    Shoulder abduction    Shoulder adduction    Shoulder extension    Shoulder internal rotation    Shoulder external rotation    Elbow flexion    Elbow extension Pain with reaching   Wrist flexion 65   Wrist extension 65pain   Wrist ulnar deviation 30   Wrist radial deviation 20   Wrist pronation Pain    Wrist supination Click and pain   (Blank rows = not tested)  Fisting and thumb AROM WNL - Opposition to base of 5th WNL    Grip strength: Right: 50 extended arm 50 - both pain  lbs; Left: 52 extended arm 52 lbs, Lateral pinch: Right: 12 lbs, Left: 13 lbs, and 3 point pinch: Right: 11 some pain  lbs, Left: 10 lbs  COORDINATION: WFL  SENSATION: Night time numbness ; ? CTS  EDEMA: none notice  COGNITION: Overall cognitive status: Within functional limits for tasks assessed      TREATMENT DATE: 09/27/23                                                                                                                           Contrast done prior to soft tissue and stretch- pt to do at home heat or contrast  2 x day Patient educated on doing morning and evening heat to right elbow Soft Tissue massage to lateral epicondyle circular back-and-forth up-and-down but 2 to 3 minutes But this this date graston tool nr 2 sweeping over volar and dorsal forearm- - supinator and pronater teres- pt to do at home - assess several types of kinesiotaping but still felt click and pain with pronation  Change stretch to extended elbow - palm down for composite forearm flexors and extensors/supinator-  5 reps hold 5 sec   Gentle pull less than a 1/10 Pt had less of click with pronation afterwards Followed by ice massage over the lateral epicondyle of the right at home  This date done ionto with dexamethazone- med patch -1.8 current - for 22 min - over lat epicondyle  Tolerate well    Patch taken off - skin check done - no issues   Patient was educated and correct application and wearing of counterforce strap - cont with  Patient had less pain Patient educated on modifications using palms or forearms in neutral to pick up or hold or carry objects keeping it close to the body, avoid reaching out with extended arm. 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 Yes  SHORT TERM GOALS: Target date: 2 wks   Patient to be independent in home program to decrease tenderness at lateral epicondyle as well as increase doing home exercises symptom-free Baseline: Tenderness 5/10 at lateral epicondyle.  Pain with wrist flexion with elbow to side, increased pain in the morning as well as use/7/10 Goal status: INITIAL   LONG TERM GOALS: Target date: 6 wks  Pain right elbow decreased with active range of motion for patient to initiate strengthening Baseline: Patient has pain with gripping, twisting, reaching, pulling and pushing -pain can be 4-7/10 Goal status: INITIAL  2.  Patient verbalize and use 3 modifications to decrease symptoms and pain in right lateral epicondyle Baseline: No knowledge of modifications for work. Goal status: INITIAL  3.  Right grip and prehension strength increased to within normal limits for her age symptom-free Baseline: Pain with grip and 3-point pinch at lateral epicondyle.  Patient right-hand-dominant.  Right grip 50 pounds left 52 pounds with extended arm  same with increased pain Goal status: INITIAL     ASSESSMENT:  CLINICAL IMPRESSION: Patient seen today for occupational therapy  evaluation for right dominant hand lateral epicondylitis.  Patient reported started about 2 months ago.  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  Changed to that position about a year ago.  Patient do teach CPR once a month.  Take blood pressure, play piano and has a 65-year-old grandson that she picks up.  Patient's resting pain 0/10.  With pain and tenderness that can increase to 4-7/10 at the right lateral epicondyle into the extensor muscle.  Patient positive test for LAT epicondylitis with tenderness, positive test for resistance to middle finger as well as wrist extension.  Pain with supination and gripping.  Pain increases with extended arm.  Patient do have a trigger point on the forearm flexors.  With some clicking at the lateral epicondyle with supination.   NOW this date less pain with lateral epicondyle motions but pronation clicking cont and pain 5/10- appear tightness in supinator - change HEP to add stretches and soft tissue- Patient limited because of above impairment in functional use of right dominant hand in ADLs and IADLs.  Patient can benefit from skilled OT services to decrease pain, increase motion and strength symptom-free and pain-free for patient to return to prior level of function.  PERFORMANCE DEFICITS: in functional skills including ADLs, IADLs, ROM, strength, pain, flexibility, decreased knowledge of use of DME, and UE functional use,   and psychosocial skills including environmental adaptation and routines and behaviors.   IMPAIRMENTS: are limiting patient from ADLs, IADLs, rest and sleep, play, leisure, and social participation.   COMORBIDITIES: has no other co-morbidities that affects occupational performance. Patient will benefit from skilled OT to address above impairments and improve overall function.  MODIFICATION OR ASSISTANCE TO COMPLETE EVALUATION: No modification of tasks or assist necessary to complete an evaluation.  OT OCCUPATIONAL PROFILE AND  HISTORY: Problem focused assessment: Including review of records relating to presenting problem.  CLINICAL DECISION MAKING: LOW - limited treatment options, no task modification necessary  REHAB POTENTIAL: Good for goals  EVALUATION COMPLEXITY: Low    PLAN:  OT FREQUENCY: 2x/week  OT DURATION: 8 weeks  PLANNED INTERVENTIONS: 97168 OT Re-evaluation, 97535 self care/ADL training, 02889 therapeutic exercise, 97530 therapeutic activity, 97140 manual therapy, 97035 ultrasound, 97034 contrast bath, 97033 iontophoresis, 97760 Orthotics management and training, 02239 Splinting (initial encounter), passive range of motion, energy conservation, patient/family education, and DME and/or AE instructions    CONSULTED AND AGREED WITH PLAN OF CARE: Patient     Ancel Peters, OTR/L,CLT 09/27/2023, 9:45 AM

## 2023-09-29 ENCOUNTER — Ambulatory Visit: Payer: 59 | Admitting: Occupational Therapy

## 2023-09-29 DIAGNOSIS — M7711 Lateral epicondylitis, right elbow: Secondary | ICD-10-CM

## 2023-09-29 DIAGNOSIS — M25521 Pain in right elbow: Secondary | ICD-10-CM

## 2023-09-29 NOTE — Therapy (Signed)
 OUTPATIENT OCCUPATIONAL THERAPY ORTHO TREATMENT  Patient Name: [name] MRN: [id number] DOB:05-22-1961, 63 y.o., female Today's Date: 09/29/2023  PCP: Dr Alla REFERRING PROVIDER: Dr Joane  END OF SESSION:  OT End of Session - 09/29/23 0814     Visit Number 3    Number of Visits 12    Date for OT Re-Evaluation 11/17/23    OT Start Time 0815    OT Stop Time 0912    OT Time Calculation (min) 57 min    Activity Tolerance Patient tolerated treatment well    Behavior During Therapy WFL for tasks assessed/performed             Past Medical History:  Diagnosis Date   Allergy    Arthritis    osteoarthritis - fingers   Asthma    Chicken pox    Chronic UTI    Cystocele    2nd degree   Endometriosis    Family history of polyps in the colon    Graves disease    History of heavy periods    Hole in the ear drum, right    Hyperlipemia    IBS (irritable bowel syndrome)    Perimenopausal    PONV (postoperative nausea and vomiting)    nausea only   Rectocele    mild   S/P endometrial ablation    Scoliosis    Skin cancer    SUI (stress urinary incontinence, female)    SVT (supraventricular tachycardia) (HCC)    Urine incontinence    Wears contact lenses    Wears hearing aid in both ears    Past Surgical History:  Procedure Laterality Date   CATARACT EXTRACTION W/PHACO Right 11/03/2021   Procedure: CATARACT EXTRACTION PHACO AND INTRAOCULAR LENS PLACEMENT (IOC) RIGHT;  Surgeon: Jaye Fallow, MD;  Location: [facility];  Service: Ophthalmology;  Laterality: Right;  2.98 0:27.0   CATARACT EXTRACTION W/PHACO Left 12/22/2021   Procedure: CATARACT EXTRACTION PHACO AND INTRAOCULAR LENS PLACEMENT (IOC) LEFT 2.38 00:26.0;  Surgeon: Jaye Fallow, MD;  Location: [facility];  Service: Ophthalmology;  Laterality: Left;   COLONOSCOPY WITH PROPOFOL  N/A 07/10/2018   Procedure: COLONOSCOPY WITH PROPOFOL ;  Surgeon: Viktoria Lamar DASEN, MD;  Location:  [facility];  Service: Endoscopy;  Laterality: N/A;   COLONOSCOPY WITH PROPOFOL  N/A 07/11/2023   Procedure: COLONOSCOPY WITH PROPOFOL ;  Surgeon: Therisa Bi, MD;  Location: [facility] ENDOSCOPY;  Service: Gastroenterology;  Laterality: N/A;   endometrrial ablation     EYE SURGERY     knee athroscopy Left 2013   laprascopic surgery     endometreosis   NASOPHARYNGOSCOPY EUSTATION TUBE BALLOON DILATION Bilateral 05/18/2018   Procedure: NASOPHARYNGOSCOPY EUSTATION TUBE BALLOON DILATION OUTFRACTURE BILATERAL INFERIOR TURBINATES;  Surgeon: Edda Mt, MD;  Location: [facility];  Service: ENT;  Laterality: Bilateral;   NASOPHARYNGOSCOPY EUSTATION TUBE BALLOON DILATION     nova-sure     POLYPECTOMY  07/11/2023   Procedure: POLYPECTOMY;  Surgeon: Therisa Bi, MD;  Location: [facility] ENDOSCOPY;  Service: Gastroenterology;;   THYROIDECTOMY, PARTIAL     TONSILLECTOMY AND ADENOIDECTOMY     tubes in ears     Patient Active Problem List   Diagnosis Date Noted   Adenomatous polyp of colon 07/11/2023   Family history of colon cancer in father 07/11/2023   Allergy to environmental factors 08/22/2019   Disorder of urinary tract 08/22/2019   Hypertensive disorder 08/22/2019   Irregular heart beat 08/22/2019   Osteoarthritis 07/24/2019   Generalized osteoarthritis of hand  07/18/2019   Encounter for orthopedic follow-up care 10/10/2018   Digital mucous cyst 07/12/2018   History of Graves' disease 06/14/2018   Pure hypercholesterolemia 06/14/2018   Rhinitis 06/14/2018   Rosacea 06/14/2018   Personal history of colon polyps, unspecified 05/09/2018   Midline cystocele 04/19/2018   Chest pain with low risk for cardiac etiology 10/26/2017   Borderline diabetes mellitus 11/05/2015   Chronic UTI 03/21/2015   SVT (supraventricular tachycardia) (HCC) 03/21/2015   Endometriosis 03/21/2015   Asthma 03/21/2015   Graves disease 03/21/2015   Irritable bowel syndrome 03/21/2015   Family history of breast  cancer 03/21/2015   Stool incontinence 03/21/2015   Gastrocnemius tear 01/18/2014   Rupture of patellar tendon 01/18/2014   Bladder infection, chronic 05/24/2012   Incomplete emptying of bladder 05/24/2012   Mixed urge and stress incontinence 05/24/2012    ONSET DATE: 2 months ago  REFERRING DIAG: Right lateral epicondylitis  THERAPY DIAG:  Pain in right elbow  Lateral epicondylitis of right elbow  Rationale for Evaluation and Treatment: Rehabilitation  SUBJECTIVE:   SUBJECTIVE STATEMENT: Been really tender over the top but the elbow is better- done the massage only Tues evening  Pt accompanied by: self  PERTINENT HISTORY:  09/15/23: Dr Joane note - Assessment and Plan: 63 y.o. female with right lateral elbow pain due to lateral epicondylitis.  Plan to refer to occupational therapy and recommend counterforce brace or wrist brace.  If not improved consider steroid injection.  Recheck after 6 weeks of occupational therapy.   Right carpal tunnel syndrome present as well.  Plan for night splint.  Plan for occupational    PRECAUTIONS: None     WEIGHT BEARING RESTRICTIONS: No  PAIN:  Are you having pain? 5/10 tenderness over supinator FALLS: Has patient fallen in last 6 months? 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  HAND DOMINANCE: Right  ADLs: Pain in right elbow with squeezing, gripping, twisting, reaching, lifting, pushing and  pulling    UPPER EXTREMITY ROM:     Active ROM Right eval Left eval R 09/29/23  Shoulder flexion     Shoulder abduction     Shoulder adduction     Shoulder extension     Shoulder internal rotation     Shoulder external rotation     Elbow flexion     Elbow extension Pain with reaching  No pain  Wrist flexion 65  73  Wrist extension 65pain  70  Wrist ulnar deviation 30  30  Wrist radial deviation 20  20  Wrist pronation Pain   Pain  Wrist supination Click and pain  Pain with click  (Blank rows = not tested)  Fisting and thumb AROM WNL - Opposition to base of 5th WNL    Grip strength: Right: 50 extended arm 50 - both pain  lbs; Left: 52 extended arm 52 lbs, Lateral pinch: Right: 12 lbs, Left: 13 lbs, and  3 point pinch: Right: 11 some pain  lbs, Left: 10 lbs  COORDINATION: WFL  SENSATION: Night time numbness ; ? CTS  EDEMA: none notice  COGNITION: Overall cognitive status: Within functional limits for tasks assessed      TREATMENT DATE: 09/29/23                                                                                                                           Contrast done to R forearm prior to soft tissue and stretch- pt to do at home contrast  2 x day Patient educated on doing morning and evening to elbow and forearm Soft Tissue massage  to shift to volar forearm this next few days - flexors and  lateral epicondyle but no on supinator But this this date graston tool nr 2 sweeping over volar forearm- -pt can do soft tissue to volar forearm  - but not supinator  Change stretch to R elbow to side but loose fist -  in neutral and pronation -  gentle stretch using L hand  composite forearm flexors  5 reps hold 5 sec each Pain free - less than 1/10  Gentle pull less than a 1/10 Followed by ice massage over the lateral epicondyle of the right at home  This date done ionto with dexamethazone- med patch -1.8 current - for 22 min - over supinator   Tolerate well    No issues can keep patch on for hour  Patient was educated and correct application and wearing of counterforce strap - cont with  Patient had less pain Patient educated on modifications using palms or forearms in neutral to pick up or hold or carry objects keeping it close to the body, avoid reaching out with extended arm. 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 Yes  SHORT TERM GOALS: Target date: 2 wks   Patient to be independent in home program to decrease tenderness at lateral epicondyle as well as increase doing home exercises symptom-free Baseline: Tenderness 5/10 at lateral epicondyle.  Pain with wrist flexion with elbow to side, increased pain in the morning as well as use/7/10 Goal status: INITIAL   LONG TERM GOALS: Target date: 6 wks  Pain right elbow decreased with active range of motion for patient to initiate strengthening Baseline: Patient has pain with gripping, twisting, reaching, pulling and pushing -pain can be 4-7/10 Goal status: INITIAL  2.  Patient verbalize and use 3 modifications to decrease symptoms and pain in right lateral epicondyle Baseline: No knowledge of modifications for work. Goal status: INITIAL  3.  Right grip and prehension strength increased to within normal limits for her age symptom-free Baseline: Pain with grip and 3-point  pinch at lateral epicondyle.  Patient right-hand-dominant.  Right grip 50 pounds left 52 pounds with extended arm same with increased pain Goal status: INITIAL     ASSESSMENT:  CLINICAL IMPRESSION: Patient seen today for occupational therapy evaluation for  right dominant hand lateral epicondylitis.  Patient reported started about 2 months ago.  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  Changed to that position about a year ago.  Patient do teach CPR once a month.  Take blood pressure, play piano and has a 65-year-old grandson that she picks up.  Patient's resting pain 0/10.  With pain and tenderness that can increase to 4-7/10 at the right lateral epicondyle into the extensor muscle.  Patient positive test for LAT epicondylitis with tenderness, positive test for resistance to middle finger as well as wrist extension.  Pain with supination and gripping.  Pain increases with extended arm.  Patient do have a trigger point on the forearm flexors.  With some clicking at the lateral epicondyle with supination.   NOW this date less pain with lateral epicondyle motions but pronation /sup clicking cont and pain 5/10 tenderness over supinator  -cont gentle stretch elbow to side - Patient limited because of above impairment in functional use of right dominant hand in ADLs and IADLs.  Patient can benefit from skilled OT services to decrease pain, increase motion and strength symptom-free and pain-free for patient to return to prior level of function.  PERFORMANCE DEFICITS: in functional skills including ADLs, IADLs, ROM, strength, pain, flexibility, decreased knowledge of use of DME, and UE functional use,   and psychosocial skills including environmental adaptation and routines and behaviors.   IMPAIRMENTS: are limiting patient from ADLs, IADLs, rest and sleep, play, leisure, and social participation.   COMORBIDITIES: has no other co-morbidities that affects occupational performance. Patient will benefit from skilled OT to address above impairments and improve overall function.  MODIFICATION OR ASSISTANCE TO COMPLETE EVALUATION: No modification of tasks or assist necessary to complete an evaluation.  OT OCCUPATIONAL PROFILE AND HISTORY: Problem  focused assessment: Including review of records relating to presenting problem.  CLINICAL DECISION MAKING: LOW - limited treatment options, no task modification necessary  REHAB POTENTIAL: Good for goals  EVALUATION COMPLEXITY: Low    PLAN:  OT FREQUENCY: 2x/week  OT DURATION: 8 weeks  PLANNED INTERVENTIONS: 97168 OT Re-evaluation, 97535 self care/ADL training, 02889 therapeutic exercise, 97530 therapeutic activity, 97140 manual therapy, 97035 ultrasound, 97034 contrast bath, 97033 iontophoresis, 97760 Orthotics management and training, 02239 Splinting (initial encounter), passive range of motion, energy conservation, patient/family education, and DME and/or AE instructions    CONSULTED AND AGREED WITH PLAN OF CARE: Patient     Ancel Peters, OTR/L,CLT 09/29/2023, 9:03 AM

## 2023-10-04 ENCOUNTER — Ambulatory Visit: Payer: 59 | Admitting: Occupational Therapy

## 2023-10-04 DIAGNOSIS — M25521 Pain in right elbow: Secondary | ICD-10-CM | POA: Diagnosis not present

## 2023-10-04 DIAGNOSIS — M7711 Lateral epicondylitis, right elbow: Secondary | ICD-10-CM

## 2023-10-04 NOTE — Therapy (Signed)
OUTPATIENT OCCUPATIONAL THERAPY ORTHO TREATMENT  Patient Name: [name] MRN: [id number] DOB:[birth year], 63 y.o., female Today's Date: 10/04/2023  PCP: Dr Burnadette Pop REFERRING PROVIDER: Dr Denyse Amass  END OF SESSION:  OT End of Session - 10/04/23 1612     Visit Number 4    Number of Visits 12    Date for OT Re-Evaluation 11/17/23    OT Start Time 1612    OT Stop Time 1715    OT Time Calculation (min) 63 min    Activity Tolerance Patient tolerated treatment well    Behavior During Therapy WFL for tasks assessed/performed             Past Medical History:  Diagnosis Date   Allergy    Arthritis    osteoarthritis - fingers   Asthma    Chicken pox    Chronic UTI    Cystocele    2nd degree   Endometriosis    Family history of polyps in the colon    Graves disease    History of heavy periods    Hole in the ear drum, right    Hyperlipemia    IBS (irritable bowel syndrome)    Perimenopausal    PONV (postoperative nausea and vomiting)    nausea only   Rectocele    mild   S/P endometrial ablation    Scoliosis    Skin cancer    SUI (stress urinary incontinence, female)    SVT (supraventricular tachycardia) (HCC)    Urine incontinence    Wears contact lenses    Wears hearing aid in both ears    Past Surgical History:  Procedure Laterality Date   CATARACT EXTRACTION W/PHACO Right 11/03/2021   Procedure: CATARACT EXTRACTION PHACO AND INTRAOCULAR LENS PLACEMENT (IOC) RIGHT;  Surgeon: Galen Manila, MD;  Location: [facility];  Service: Ophthalmology;  Laterality: Right;  2.98 0:27.0   CATARACT EXTRACTION W/PHACO Left 12/22/2021   Procedure: CATARACT EXTRACTION PHACO AND INTRAOCULAR LENS PLACEMENT (IOC) LEFT 2.38 00:26.0;  Surgeon: Galen Manila, MD;  Location: [facility];  Service: Ophthalmology;  Laterality: Left;   COLONOSCOPY WITH PROPOFOL N/A 07/10/2018   Procedure: COLONOSCOPY WITH PROPOFOL;  Surgeon: Scot Jun, MD;  Location:  [facility];  Service: Endoscopy;  Laterality: N/A;   COLONOSCOPY WITH PROPOFOL N/A 07/11/2023   Procedure: COLONOSCOPY WITH PROPOFOL;  Surgeon: Wyline Mood, MD;  Location: [facility] ENDOSCOPY;  Service: Gastroenterology;  Laterality: N/A;   endometrrial ablation     EYE SURGERY     knee athroscopy Left 2013   laprascopic surgery     endometreosis   NASOPHARYNGOSCOPY EUSTATION TUBE BALLOON DILATION Bilateral 05/18/2018   Procedure: NASOPHARYNGOSCOPY EUSTATION TUBE BALLOON DILATION OUTFRACTURE BILATERAL INFERIOR TURBINATES;  Surgeon: Vernie Murders, MD;  Location: [facility];  Service: ENT;  Laterality: Bilateral;   NASOPHARYNGOSCOPY EUSTATION TUBE BALLOON DILATION     nova-sure     POLYPECTOMY  07/11/2023   Procedure: POLYPECTOMY;  Surgeon: Wyline Mood, MD;  Location: [facility] END[facility]COPY;  Service: Gastroenterology;;   THYROIDECTOMY, PARTIAL     TONSILLECTOMY AND ADENOIDECTOMY     tubes in ears     Patient Active Problem List   Diagnosis Date Noted   Adenomatous polyp of colon 07/11/2023   Family history of colon cancer in father 07/11/2023   Allergy to environmental factors 08/22/2019   Disorder of urinary tract 08/22/2019   Hypertensive disorder 08/22/2019   Irregular heart beat 08/22/2019   Osteoarthritis 07/24/2019   Generalized osteoarthritis of hand  07/18/2019   Encounter for orthopedic follow-up care 10/10/2018   Digital mucous cyst 07/12/2018   History of Graves' disease 06/14/2018   Pure hypercholesterolemia 06/14/2018   Rhinitis 06/14/2018   Rosacea 06/14/2018   Personal history of colon polyps, unspecified 05/09/2018   Midline cystocele 04/19/2018   Chest pain with low risk for cardiac etiology 10/26/2017   Borderline diabetes mellitus 11/05/2015   Chronic UTI 03/21/2015   SVT (supraventricular tachycardia) (HCC) 03/21/2015   Endometriosis 03/21/2015   Asthma 03/21/2015   Graves disease 03/21/2015   Irritable bowel syndrome 03/21/2015   Family history of breast  cancer 03/21/2015   Stool incontinence 03/21/2015   Gastrocnemius tear 01/18/2014   Rupture of patellar tendon 01/18/2014   Bladder infection, chronic 05/24/2012   Incomplete emptying of bladder 05/24/2012   Mixed urge and stress incontinence 05/24/2012    ONSET DATE: 2 months ago  REFERRING DIAG: Right lateral epicondylitis  THERAPY DIAG:  Pain in right elbow  Lateral epicondylitis of right elbow  Rationale for Evaluation and Treatment: Rehabilitation  SUBJECTIVE:   SUBJECTIVE STATEMENT: My elbow forearm was really sore on Saturday.  I was cleaning house and getting ready for [employer]- the clicking still there every time I supinate and pronate  Pt accompanied by: self  PERTINENT HISTORY:  09/15/23: Dr Denyse Amass note - Assessment and Plan: 63 y.o. female with right lateral elbow pain due to lateral epicondylitis.  Plan to refer to occupational therapy and recommend counterforce brace or wrist brace.  If not improved consider steroid injection.  Recheck after 6 weeks of occupational therapy.   Right carpal tunnel syndrome present as well.  Plan for night splint.  Plan for occupational    PRECAUTIONS: None     WEIGHT BEARING RESTRICTIONS: No  PAIN:  Are you having pain? 5/10 tenderness over supinator and clicking sup/pro FALLS: Has patient fallen in last 6 months? 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  HAND DOMINANCE: Right  ADLs: Pain  in right elbow with squeezing, gripping, twisting, reaching, lifting, pushing and pulling    UPPER EXTREMITY ROM:     Active ROM Right eval Left eval R 09/29/23  Shoulder flexion     Shoulder abduction     Shoulder adduction     Shoulder extension     Shoulder internal rotation     Shoulder external rotation     Elbow flexion     Elbow extension Pain with reaching  No pain  Wrist flexion 65  73  Wrist extension 65pain  70  Wrist ulnar deviation 30  30  Wrist radial deviation 20  20  Wrist pronation Pain   Pain  Wrist supination Click and pain  Pain with click  (Blank rows = not tested)  Fisting and thumb AROM WNL - Opposition to base of 5th WNL    Grip strength: Right: 50 extended arm 50 - both pain  lbs;  Left: 52 extended arm 52 lbs, Lateral pinch: Right: 12 lbs, Left: 13 lbs, and 3 point pinch: Right: 11 some pain  lbs, Left: 10 lbs  COORDINATION: WFL  SENSATION: Night time numbness ; ? CTS  EDEMA: none notice  COGNITION: Overall cognitive status: Within functional limits for tasks assessed      TREATMENT DATE: 10/04/23                                                                                                                           Patient continues to have a click at the proximal radius head with pronation supination. With elbow extended and elbow to side. Pain can increase to 5/10. Tenderness continue with supinator. Patient is to stop wearing her counterforce strap. Also stop sleeping with a wrist brace at night. Patient to hold off on any passive stretches and soft tissue massage to forearm extensors and supinator. Attempted taping for forearm extensors and supinator with radial tunnel compression-continue to have clicking and same amount of pain with it on. Fabricated for patient elbow pad to wear at nighttime to avoid full flexion at elbow.  Patient to do moist heat to elbow 3 times a day Followed by gentle AAROM Hold off on soft tissue as  well as passive stretches 5 reps hold 5 seconds for forearm extensors with elbow to side And 5 reps hold 5 seconds for forearm extensors extended arm into shoulder extension Then 5 reps 3 seconds each fist 3 steps of radial nerve glide  Patient with no tenderness over lateral epicondyle as well as medial epicondyle.  No pain with gripping extended arm and elbow to side. No pain with resistance to wrist extension and third digit extension.     PATIENT EDUCATION: Education details: findings of eval and HEP  Person educated: Patient Education method: Explanation, Demonstration, Tactile cues, Verbal cues, and Handouts Education comprehension: verbalized understanding, returned demonstration, verbal cues required, and needs further education    GOALS: Goals reviewed with patient? Yes  SHORT TERM GOALS: Target date: 2 wks   Patient to be independent in home program to decrease tenderness at lateral epicondyle as well as increase doing home exercises symptom-free Baseline: Tenderness 5/10 at lateral epicondyle.  Pain with wrist flexion with elbow to side, increased pain in the morning as well as use/7/10 Goal status: INITIAL   LONG TERM GOALS: Target date: 6 wks  Pain right elbow decreased with active range of motion for patient to initiate strengthening Baseline: Patient has pain with gripping, twisting, reaching, pulling and pushing -pain can be 4-7/10 Goal status: INITIAL  2.  Patient verbalize and use 3 modifications to decrease symptoms and pain in right lateral epicondyle Baseline: No knowledge of modifications for work. Goal status: INITIAL  3.  Right grip and prehension strength increased to within normal limits for her age symptom-free Baseline: Pain with grip and 3-point pinch at lateral epicondyle.  Patient right-hand-dominant.  Right grip 50 pounds left 52 pounds  with extended arm same with increased pain Goal status: INITIAL     ASSESSMENT:  CLINICAL  IMPRESSION: Patient seen today for occupational therapy evaluation for right dominant hand lateral epicondylitis.  Patient reported started about 2 months ago.  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  Changed to that position about a year ago.  Patient do teach CPR once a month.  Take blood pressure, play piano and has a 67-year-old grandson that she picks up.  Patient's resting pain 0/10.  With pain and tenderness that can increase to 4-7/10 at the right lateral epicondyle into the extensor muscle.  Patient positive test for LAT epicondylitis with tenderness, positive test for resistance to middle finger as well as wrist extension.  Pain with supination and gripping.  Pain increases with extended arm.  Patient do have a trigger point on the forearm flexors.  With some clicking at the lateral epicondyle with supination.   NOW the state patient continues to have no tenderness or pain at lateral epicondyle or medial epicondyle.  No pain with gripping or squeezing elbow to side or extended arm.  No pain with wrist extension flexion ulnar radial deviation resistance.  Patient report continues clicking with supination pronation with elbow to side and extended arm.  With pain 5/10.  The proximal radius head.  OT unable to get any splinting or taping to relieve clicking and pain.  Change patient's home program to moist heat-hold off on any soft tissue and passive stretches.  Changed to active forearm extensor stretch with elbow to side,- forearm active extensor stretch extended arm into shoulder extension and radial nerve glides -contact Dr. Denyse Amass and patient to see him for x-ray and ultrasound.  Patient limited because of above impairment in functional use of right dominant hand in ADLs and IADLs.  Patient can benefit from skilled OT services to decrease pain, increase motion and strength symptom-free and pain-free for patient to return to prior level of function.  PERFORMANCE DEFICITS: in functional  skills including ADLs, IADLs, ROM, strength, pain, flexibility, decreased knowledge of use of DME, and UE functional use,   and psychosocial skills including environmental adaptation and routines and behaviors.   IMPAIRMENTS: are limiting patient from ADLs, IADLs, rest and sleep, play, leisure, and social participation.   COMORBIDITIES: has no other co-morbidities that affects occupational performance. Patient will benefit from skilled OT to address above impairments and improve overall function.  MODIFICATION OR ASSISTANCE TO COMPLETE EVALUATION: No modification of tasks or assist necessary to complete an evaluation.  OT OCCUPATIONAL PROFILE AND HISTORY: Problem focused assessment: Including review of records relating to presenting problem.  CLINICAL DECISION MAKING: LOW - limited treatment options, no task modification necessary  REHAB POTENTIAL: Good for goals  EVALUATION COMPLEXITY: Low    PLAN:  OT FREQUENCY: 2x/week  OT DURATION: 8 weeks  PLANNED INTERVENTIONS: 97168 OT Re-evaluation, 97535 self care/ADL training, 16109 therapeutic exercise, 97530 therapeutic activity, 97140 manual therapy, 97035 ultrasound, 97034 contrast bath, 97033 iontophoresis, 97760 Orthotics management and training, 60454 Splinting (initial encounter), passive range of motion, energy conservation, patient/family education, and DME and/or AE instructions    CONSULTED AND AGREED WITH PLAN OF CARE: Patient     Oletta Cohn, OTR/L,CLT 10/04/2023, 5:09 PM

## 2023-10-05 ENCOUNTER — Other Ambulatory Visit: Payer: Self-pay

## 2023-10-05 ENCOUNTER — Ambulatory Visit (INDEPENDENT_AMBULATORY_CARE_PROVIDER_SITE_OTHER): Payer: 59

## 2023-10-05 ENCOUNTER — Encounter: Payer: Self-pay | Admitting: Family Medicine

## 2023-10-05 ENCOUNTER — Ambulatory Visit: Payer: Self-pay

## 2023-10-05 ENCOUNTER — Ambulatory Visit
Admission: RE | Admit: 2023-10-05 | Discharge: 2023-10-05 | Disposition: A | Discharge status: Home / Self Care | Payer: 59 | Source: Ambulatory Visit | Attending: Oncology | Admitting: Oncology

## 2023-10-05 ENCOUNTER — Ambulatory Visit (INDEPENDENT_AMBULATORY_CARE_PROVIDER_SITE_OTHER): Payer: 59 | Admitting: Family Medicine

## 2023-10-05 ENCOUNTER — Other Ambulatory Visit: Payer: 59

## 2023-10-05 VITALS — BP 122/82 | HR 96 | Ht 62.0 in | Wt 125.0 lb

## 2023-10-05 DIAGNOSIS — C189 Malignant neoplasm of colon, unspecified: Secondary | ICD-10-CM | POA: Diagnosis not present

## 2023-10-05 DIAGNOSIS — R970 Elevated carcinoembryonic antigen [CEA]: Secondary | ICD-10-CM | POA: Insufficient documentation

## 2023-10-05 DIAGNOSIS — G8929 Other chronic pain: Secondary | ICD-10-CM

## 2023-10-05 DIAGNOSIS — Z803 Family history of malignant neoplasm of breast: Secondary | ICD-10-CM | POA: Insufficient documentation

## 2023-10-05 DIAGNOSIS — Z800 Family history of malignant neoplasm of digestive organs: Secondary | ICD-10-CM | POA: Insufficient documentation

## 2023-10-05 DIAGNOSIS — M25521 Pain in right elbow: Secondary | ICD-10-CM | POA: Diagnosis not present

## 2023-10-05 MED ORDER — LOVASTATIN 20 MG PO TABS
20.0000 mg | ORAL_TABLET | Freq: Every day | ORAL | 1 refills | Status: DC
  Filled 2023-10-05: qty 90, 90d supply, fill #0
  Filled 2024-01-03: qty 90, 90d supply, fill #1
  Filled 2024-04-07: qty 90, 90d supply, fill #2

## 2023-10-05 MED ORDER — IOHEXOL 300 MG/ML  SOLN
100.0000 mL | Freq: Once | INTRAMUSCULAR | Status: AC | PRN
  Administered 2023-10-05: 100 mL via INTRAVENOUS

## 2023-10-05 NOTE — Progress Notes (Signed)
   Rubin Payor, PhD, LAT, ATC acting as a scribe for Clementeen Graham, MD.  [name] is a 63 y.o. female who presents to [facility] at [facility] today for cont'd R forearm pain. Pt was last seen by Dr. Denyse Amass on 09/15/23 and was advised to use a counter-force brace or wrist brace, and was referred to OT, completing 4 visits.  Today, pt reports a "click" around the radial head w/ pronation/supination. The "clicking" causes pain. R forearm/elbow pain is no better. No paraesthesia noted.   Pertinent review of systems: No fevers or chills  Relevant historical information: Graves' disease   Exam:  BP 122/82   Pulse 96   Ht 5\' 2"  (1.575 m)   Wt 125 lb (56.7 kg)   LMP 07/01/2014 (Exact Date)   SpO2 99%   BMI 22.86 kg/m  General: Well Developed, well nourished, and in no acute distress.   MSK: Right elbow normal-appearing normal motion.  Palpable click overlying the radial head with pronation and supination of the forearm.    Lab and Radiology Results  Diagnostic Limited MSK Ultrasound of: Right lateral elbow Radial head visualized.  No catching tissue or loose body visible at the radial head with pronation and supination on dynamic imaging. Impression: Unclear source of popping or clicking on MSK ultrasound.  X-ray images right elbow obtained today personally and independently interpreted. No acute fractures.  No severe DJD. Await formal radiology review   Assessment and Plan: 63 y.o. female with right elbow pain with mechanical popping and catching.  She is attempting occupational hand therapy and has had 4 visits with little benefit.  Based on her mechanical symptoms I am concerned that she has a loose body not seen on x-ray or a tear of the annular ligament or some other explanation provide mechanical symptoms.  I spoke with radiologist and we will plan on noncontrast MRI.  He thinks the arthrogram contrast may actually interfere with ability to  visualize the ligaments well.  Following MRI consider referral to hand surgery or specialized orthopedic surgery.   PDMP not reviewed this encounter. Orders Placed This Encounter  Procedures   DG ELBOW COMPLETE RIGHT (3+VIEW)    Standing Status:   Future    Number of Occurrences:   1    Expiration Date:   11/02/2023    Reason for Exam (SYMPTOM  OR DIAGNOSIS REQUIRED):   right elbow pain    Preferred imaging location?:   [facility] [place]   [place] LIMITED JOINT SPACE STRUCTURES UP RIGHT(NO LINKED CHARGES)    Reason for Exam (SYMPTOM  OR DIAGNOSIS REQUIRED):   right elbow pain    Preferred imaging location?:   [facility]   No orders of the defined types were placed in this encounter.    Discussed warning signs or symptoms. Please see discharge instructions. Patient expresses understanding.   The above documentation has been reviewed and is accurate and complete Clementeen Graham, M.D.

## 2023-10-05 NOTE — Patient Instructions (Addendum)
Thank you for coming in today.   Please get an Xray today before you leave

## 2023-10-06 ENCOUNTER — Ambulatory Visit: Payer: 59 | Admitting: Occupational Therapy

## 2023-10-20 ENCOUNTER — Encounter: Payer: Self-pay | Admitting: Oncology

## 2023-10-20 ENCOUNTER — Ambulatory Visit
Admission: RE | Admit: 2023-10-20 | Discharge: 2023-10-20 | Disposition: A | Discharge status: Home / Self Care | Payer: 59 | Source: Ambulatory Visit | Attending: Family Medicine | Admitting: Family Medicine

## 2023-10-20 DIAGNOSIS — M25521 Pain in right elbow: Secondary | ICD-10-CM | POA: Diagnosis not present

## 2023-10-20 DIAGNOSIS — G8929 Other chronic pain: Secondary | ICD-10-CM | POA: Diagnosis not present

## 2023-10-20 NOTE — Telephone Encounter (Signed)
 Please have her come next week and get her cea checked on Monday 3/3

## 2023-10-21 ENCOUNTER — Other Ambulatory Visit: Payer: Self-pay

## 2023-10-21 ENCOUNTER — Encounter: Payer: Self-pay | Admitting: Family Medicine

## 2023-10-21 ENCOUNTER — Other Ambulatory Visit: Payer: Self-pay | Admitting: *Deleted

## 2023-10-21 DIAGNOSIS — R970 Elevated carcinoembryonic antigen [CEA]: Secondary | ICD-10-CM

## 2023-10-21 NOTE — Progress Notes (Signed)
 Right elbow x-ray looks okay.

## 2023-10-24 ENCOUNTER — Inpatient Hospital Stay: Payer: 59 | Attending: Oncology

## 2023-10-24 DIAGNOSIS — R970 Elevated carcinoembryonic antigen [CEA]: Secondary | ICD-10-CM | POA: Diagnosis not present

## 2023-10-25 ENCOUNTER — Telehealth: Payer: Self-pay

## 2023-10-25 ENCOUNTER — Other Ambulatory Visit: Payer: Self-pay

## 2023-10-25 DIAGNOSIS — R970 Elevated carcinoembryonic antigen [CEA]: Secondary | ICD-10-CM

## 2023-10-25 DIAGNOSIS — Z803 Family history of malignant neoplasm of breast: Secondary | ICD-10-CM

## 2023-10-25 LAB — CEA: CEA: 8.4 ng/mL — ABNORMAL HIGH (ref 0.0–4.7)

## 2023-10-25 NOTE — Telephone Encounter (Signed)
 Informed patient per Dr Smith Robert  cea Is still elevated at 8.  ordered ct chest without contrast in 1-2 weeks

## 2023-10-31 ENCOUNTER — Encounter: Payer: Self-pay | Admitting: Family Medicine

## 2023-11-01 ENCOUNTER — Ambulatory Visit
Admission: RE | Admit: 2023-11-01 | Discharge: 2023-11-01 | Disposition: A | Discharge status: Home / Self Care | Source: Ambulatory Visit | Attending: Oncology | Admitting: Oncology

## 2023-11-01 ENCOUNTER — Encounter: Payer: Self-pay | Admitting: Family Medicine

## 2023-11-01 DIAGNOSIS — R970 Elevated carcinoembryonic antigen [CEA]: Secondary | ICD-10-CM | POA: Insufficient documentation

## 2023-11-01 DIAGNOSIS — Z803 Family history of malignant neoplasm of breast: Secondary | ICD-10-CM | POA: Diagnosis not present

## 2023-11-01 DIAGNOSIS — R911 Solitary pulmonary nodule: Secondary | ICD-10-CM | POA: Diagnosis not present

## 2023-11-01 NOTE — Progress Notes (Signed)
 Right elbow MRI shows swelling at the back of the elbow causing pain especially when he tried to extend her elbow fully.  I do not think this will need surgery.  Recommend return to clinic to talk about the results in full detail and potentially consider an injection.

## 2023-11-03 NOTE — Progress Notes (Unsigned)
   Rubin Payor, PhD, LAT, ATC acting as a scribe for Clementeen Graham, MD.  [name] is a 63 y.o. female who presents to [facility] at [facility] today for f/u R elbow pain w/ MRI review. Pt was last seen by Dr. Denyse Amass on 10/14/23 and was advised to proceed to MRI.  Today, pt reports ***  Dx imaging: 10/20/23 R elbow MRI  10/05/23 R elbow XR  Pertinent review of systems: ***  Relevant historical information: ***   Exam:  LMP 07/01/2014 (Exact Date)  General: Well Developed, well nourished, and in no acute distress.   MSK: ***    Lab and Radiology Results No results found for this or any previous visit (from the past 72 hours). No results found.     Assessment and Plan: 63 y.o. female with ***   PDMP not reviewed this encounter. No orders of the defined types were placed in this encounter.  No orders of the defined types were placed in this encounter.    Discussed warning signs or symptoms. Please see discharge instructions. Patient expresses understanding.   ***

## 2023-11-04 ENCOUNTER — Encounter: Payer: Self-pay | Admitting: Family Medicine

## 2023-11-04 ENCOUNTER — Other Ambulatory Visit: Payer: Self-pay

## 2023-11-04 ENCOUNTER — Ambulatory Visit: Admitting: Family Medicine

## 2023-11-04 VITALS — BP 110/78 | HR 121 | Ht 62.0 in | Wt 126.0 lb

## 2023-11-04 DIAGNOSIS — M25521 Pain in right elbow: Secondary | ICD-10-CM | POA: Diagnosis not present

## 2023-11-04 NOTE — Patient Instructions (Signed)
 Thank you for coming in today.   Call or go to the ER if you develop a large red swollen joint with extreme pain or oozing puss.    If this does not work I will get you an appointment with Dr Steward Drone or Amanda Pea.   Let me know. Give it a few weeks.   We can try a radial tunnel injection but I may make your arm weak for a few hours so we should probably do it in the afternoon or be prepared to go home if I do a really good job blocking the nerve.

## 2023-11-24 ENCOUNTER — Encounter: Payer: Self-pay | Admitting: Oncology

## 2023-11-24 DIAGNOSIS — R970 Elevated carcinoembryonic antigen [CEA]: Secondary | ICD-10-CM

## 2023-11-25 NOTE — Telephone Encounter (Signed)
 Please order/ schedule [facility] thyroid for elevated cEA in the next couple of weeks

## 2023-12-01 ENCOUNTER — Other Ambulatory Visit: Payer: Self-pay

## 2023-12-01 MED ORDER — METOPROLOL SUCCINATE ER 50 MG PO TB24
50.0000 mg | ORAL_TABLET | Freq: Every day | ORAL | 3 refills | Status: AC
  Filled 2023-12-01: qty 90, 90d supply, fill #0
  Filled 2024-03-03: qty 90, 90d supply, fill #1
  Filled 2024-05-27: qty 90, 90d supply, fill #2
  Filled 2024-08-29: qty 90, 90d supply, fill #3

## 2023-12-02 ENCOUNTER — Ambulatory Visit
Admission: RE | Admit: 2023-12-02 | Discharge: 2023-12-02 | Disposition: A | Discharge status: Home / Self Care | Source: Ambulatory Visit | Attending: Oncology | Admitting: Oncology

## 2023-12-02 DIAGNOSIS — R970 Elevated carcinoembryonic antigen [CEA]: Secondary | ICD-10-CM | POA: Diagnosis not present

## 2023-12-02 DIAGNOSIS — R590 Localized enlarged lymph nodes: Secondary | ICD-10-CM | POA: Diagnosis not present

## 2023-12-12 ENCOUNTER — Other Ambulatory Visit: Payer: Self-pay

## 2023-12-12 ENCOUNTER — Encounter: Payer: Self-pay | Admitting: Family Medicine

## 2023-12-12 DIAGNOSIS — G8929 Other chronic pain: Secondary | ICD-10-CM

## 2023-12-13 DIAGNOSIS — E0500 Thyrotoxicosis with diffuse goiter without thyrotoxic crisis or storm: Secondary | ICD-10-CM | POA: Diagnosis not present

## 2023-12-13 DIAGNOSIS — E7800 Pure hypercholesterolemia, unspecified: Secondary | ICD-10-CM | POA: Diagnosis not present

## 2023-12-13 DIAGNOSIS — R7303 Prediabetes: Secondary | ICD-10-CM | POA: Diagnosis not present

## 2023-12-17 ENCOUNTER — Other Ambulatory Visit: Payer: Self-pay

## 2023-12-18 ENCOUNTER — Other Ambulatory Visit: Payer: Self-pay

## 2023-12-20 ENCOUNTER — Telehealth: Payer: Self-pay

## 2023-12-20 NOTE — Telephone Encounter (Signed)
-----   Message from [name] sent at 12/19/2023  4:32 PM EDT ----- I have tried calling her a few times. She was referred to me for elevated CEA. No malignancy has been found on CT and [facility] thyroid . No f/u with me required. Please let her know. I would not recommend to keep monitoring her CEA at this time

## 2023-12-20 NOTE — Telephone Encounter (Signed)
 Per Dr. Randy Buttery "I have tried calling her a few times. She was referred to me for elevated CEA. No malignancy has been found on CT and [facility] thyroid . No f/u with me required. Please let her know. I would not recommend to keep monitoring her CEA at this time".  Outbound call to patient;

## 2023-12-21 DIAGNOSIS — E7800 Pure hypercholesterolemia, unspecified: Secondary | ICD-10-CM | POA: Diagnosis not present

## 2023-12-21 DIAGNOSIS — Z8249 Family history of ischemic heart disease and other diseases of the circulatory system: Secondary | ICD-10-CM | POA: Diagnosis not present

## 2023-12-21 DIAGNOSIS — R7303 Prediabetes: Secondary | ICD-10-CM | POA: Diagnosis not present

## 2023-12-21 DIAGNOSIS — Z8639 Personal history of other endocrine, nutritional and metabolic disease: Secondary | ICD-10-CM | POA: Diagnosis not present

## 2023-12-21 DIAGNOSIS — I471 Supraventricular tachycardia, unspecified: Secondary | ICD-10-CM | POA: Diagnosis not present

## 2023-12-21 DIAGNOSIS — I2510 Atherosclerotic heart disease of native coronary artery without angina pectoris: Secondary | ICD-10-CM | POA: Diagnosis not present

## 2023-12-21 DIAGNOSIS — Z0000 Encounter for general adult medical examination without abnormal findings: Secondary | ICD-10-CM | POA: Diagnosis not present

## 2023-12-22 ENCOUNTER — Other Ambulatory Visit: Payer: Self-pay | Admitting: Physician Assistant

## 2023-12-22 ENCOUNTER — Other Ambulatory Visit: Payer: Self-pay

## 2023-12-22 DIAGNOSIS — I471 Supraventricular tachycardia, unspecified: Secondary | ICD-10-CM

## 2023-12-22 DIAGNOSIS — Z8249 Family history of ischemic heart disease and other diseases of the circulatory system: Secondary | ICD-10-CM

## 2023-12-22 DIAGNOSIS — E7800 Pure hypercholesterolemia, unspecified: Secondary | ICD-10-CM

## 2023-12-22 DIAGNOSIS — I2510 Atherosclerotic heart disease of native coronary artery without angina pectoris: Secondary | ICD-10-CM

## 2023-12-22 MED ORDER — IMIPRAMINE HCL 25 MG PO TABS
50.0000 mg | ORAL_TABLET | Freq: Every day | ORAL | 1 refills | Status: DC
  Filled 2023-12-22: qty 180, 90d supply, fill #0
  Filled 2023-12-28 (×2): qty 200, 100d supply, fill #0
  Filled 2024-01-02: qty 180, 90d supply, fill #0
  Filled 2024-04-07: qty 180, 90d supply, fill #1
  Filled 2024-07-07: qty 40, 20d supply, fill #2

## 2023-12-28 ENCOUNTER — Ambulatory Visit (INDEPENDENT_AMBULATORY_CARE_PROVIDER_SITE_OTHER): Payer: 59 | Admitting: Urology

## 2023-12-28 ENCOUNTER — Encounter: Payer: Self-pay | Admitting: Urology

## 2023-12-28 ENCOUNTER — Other Ambulatory Visit: Payer: Self-pay

## 2023-12-28 VITALS — BP 106/70 | HR 79 | Ht 62.0 in | Wt 126.0 lb

## 2023-12-28 DIAGNOSIS — N8111 Cystocele, midline: Secondary | ICD-10-CM | POA: Diagnosis not present

## 2023-12-28 DIAGNOSIS — N390 Urinary tract infection, site not specified: Secondary | ICD-10-CM | POA: Diagnosis not present

## 2023-12-28 DIAGNOSIS — N3941 Urge incontinence: Secondary | ICD-10-CM

## 2023-12-28 MED ORDER — NITROFURANTOIN MACROCRYSTAL 50 MG PO CAPS
50.0000 mg | ORAL_CAPSULE | Freq: Every day | ORAL | 1 refills | Status: AC
  Filled 2023-12-28: qty 90, 90d supply, fill #0
  Filled 2024-08-17: qty 90, 90d supply, fill #1

## 2023-12-28 NOTE — Progress Notes (Signed)
 12/28/2023 1:42 PM   [name] 1961/07/15 [id number]  Referring provider: Monique Ano, MD [phone] [facility] [address] [facility] [place],  [place] [address]  Urological history: 1. rUTI's - cysto (06/2022) - NED  -taking cranberry tablets  2. SUI  3. Cystocele -grade 1-2 cystocele   HPI: [name] is a 63 y.o. woman who presents today for her yearly visit.  Previous records reviewed.     She is having 1-7 daytime voids, 1-2 episodes of nocturia with strong urge to urinate.  She has urge incontinence.  She wears 1 panty liner daily.  She does not limit fluid intake.  She does not engage in toilet mapping.  Patient denies any modifying or aggravating factors.  Patient denies any recent UTI's, gross hematuria, dysuria or suprapubic/flank pain.  Patient denies any fevers, chills, nausea or vomiting.    Patient denies any modifying or aggravating factors.  Patient denies any recent UTI's, gross hematuria, dysuria or suprapubic/flank pain.  Patient denies any fevers, chills, nausea or vomiting.    She is taking the Macrodantin  50 mg three times weekly.   She has not had nay breakthrough infections.   She is having some urge incontinence when she holds her urine too long.  She also has a cystocele that she can sometimes feel, but is not bothersome at this time.  She is also seeing gynecology and they have offered Pesiri, but she has deferred   PMH: Past Medical History:  Diagnosis Date   Allergy    Arthritis    osteoarthritis - fingers   Asthma    Chicken pox    Chronic UTI    Cystocele    2nd degree   Endometriosis    Family history of polyps in the colon    Graves disease    History of heavy periods    Hole in the ear drum, right    Hyperlipemia    IBS (irritable bowel syndrome)    Perimenopausal    PONV (postoperative nausea and vomiting)    nausea only   Rectocele    mild   S/P endometrial ablation    Scoliosis    Skin cancer     SUI (stress urinary incontinence, female)    SVT (supraventricular tachycardia) (HCC)    Urine incontinence    Wears contact lenses    Wears hearing aid in both ears     Surgical History: Past Surgical History:  Procedure Laterality Date   CATARACT EXTRACTION W/PHACO Right 11/03/2021   Procedure: CATARACT EXTRACTION PHACO AND INTRAOCULAR LENS PLACEMENT (IOC) RIGHT;  Surgeon: Clair Crews, MD;  Location: [facility];  Service: Ophthalmology;  Laterality: Right;  2.98 0:27.0   CATARACT EXTRACTION W/PHACO Left 12/22/2021   Procedure: CATARACT EXTRACTION PHACO AND INTRAOCULAR LENS PLACEMENT (IOC) LEFT 2.38 00:26.0;  Surgeon: Clair Crews, MD;  Location: [facility];  Service: Ophthalmology;  Laterality: Left;   COLONOSCOPY WITH PROPOFOL  N/A 07/10/2018   Procedure: COLONOSCOPY WITH PROPOFOL ;  Surgeon: Cassie Click, MD;  Location: [facility] ENDOSCOPY;  Service: Endoscopy;  Laterality: N/A;   COLONOSCOPY WITH PROPOFOL  N/A 07/11/2023   Procedure: COLONOSCOPY WITH PROPOFOL ;  Surgeon: Luke Salaam, MD;  Location: [facility] END[facility]COPY;  Service: Gastroenterology;  Laterality: N/A;   endometrrial ablation     EYE SURGERY     knee athroscopy Left 2013   laprascopic surgery     endometreosis   NASOPHARYNGOSCOPY EUSTATION TUBE BALLOON DILATION Bilateral 05/18/2018   Procedure: NASOPHARYNGOSCOPY EUSTATION TUBE  BALLOON DILATION OUTFRACTURE BILATERAL INFERIOR TURBINATES;  Surgeon: Mellody Sprout, MD;  Location: [facility];  Service: ENT;  Laterality: Bilateral;   NASOPHARYNGOSCOPY EUSTATION TUBE BALLOON DILATION     nova-sure     POLYPECTOMY  07/11/2023   Procedure: POLYPECTOMY;  Surgeon: Luke Salaam, MD;  Location: [facility] ENDOSCOPY;  Service: Gastroenterology;;   THYROIDECTOMY, PARTIAL     TONSILLECTOMY AND ADENOIDECTOMY     tubes in ears      Home Medications:  Allergies as of 12/28/2023       Reactions   Ciprofloxacin Anaphylaxis   Floxin [ofloxacin] Anaphylaxis    Levofloxacin Anaphylaxis   Quinolones Anaphylaxis   Amoxicillin    fever   Bextra [valdecoxib]    Gyne-lotrimin [clotrimazole]    redness   Penicillins Rash   Septra [sulfamethoxazole-trimethoprim ] Rash   Flushed and fever.   Sulfa Antibiotics Rash   fever        Medication List        Accurate as of Dec 28, 2023  1:42 PM. If you have any questions, ask your nurse or doctor.          albuterol  108 (90 Base) MCG/ACT inhaler Commonly known as: VENTOLIN  HFA Inhale 2 inhalations into the lungs every 4 (four) hours as needed for Wheezing   cholecalciferol 1000 units tablet Commonly known as: VITAMIN D Take 1,000 Units by mouth daily.   EPINEPHrine  0.3 mg/0.3 mL Soaj injection Commonly known as: EPI-PEN Inject 0.3 mLs (0.3 mg total) into the muscle once as needed for up to 1 dose.   fexofenadine 180 MG tablet Commonly known as: ALLEGRA Allegra Allergy 180 mg tablet  Take 1 tablet every day by oral route.   fluticasone  50 MCG/ACT nasal spray Commonly known as: FLONASE  Spray 2 sprays into both nostrils once daily as needed.   imipramine  25 MG tablet Commonly known as: TOFRANIL  Take 2 tablets (50 mg total) by mouth at bedtime.   levothyroxine  100 MCG tablet Commonly known as: SYNTHROID  Take 1 tablet (100 mcg total) by mouth daily.   lovastatin  20 MG tablet Commonly known as: MEVACOR  Take 1 tablet (20 mg total) by mouth daily with supper.   metoprolol  succinate 50 MG 24 hr tablet Commonly known as: TOPROL -XL Take 1 tablet (50 mg total) by mouth daily.   montelukast  10 MG tablet Commonly known as: SINGULAIR  Take 1 tablet (10 mg total) by mouth at bedtime   Multivitamin Adult Chew Chew 1 tablet by mouth daily.   nitrofurantoin  50 MG capsule Commonly known as: MACRODANTIN  Take 1 capsule (50 mg total) by mouth daily. What changed:  when to take this additional instructions        Allergies:  Allergies  Allergen Reactions   Ciprofloxacin Anaphylaxis    Floxin [Ofloxacin] Anaphylaxis   Levofloxacin Anaphylaxis   Quinolones Anaphylaxis   Amoxicillin     fever   Bextra [Valdecoxib]    Gyne-Lotrimin [Clotrimazole]     redness   Penicillins Rash   Septra [Sulfamethoxazole-Trimethoprim ] Rash    Flushed and fever.   Sulfa Antibiotics Rash    fever    Family History: Family History  Problem Relation Age of Onset   Cancer Mother    Hyperlipidemia Mother    Heart disease Mother    Hypertension Mother    Breast cancer Mother 48   Kidney failure Mother    Stroke Mother    Cancer Father        colon   Hyperlipidemia Father  Heart disease Father    Hypertension Father    Breast cancer Maternal Aunt 58   Breast cancer Paternal Aunt 50   Kidney cancer Neg Hx    Bladder Cancer Neg Hx    Prostate cancer Neg Hx    Ovarian cancer Neg Hx    Diabetes Neg Hx     Social History:  reports that she has never smoked. She has never been exposed to tobacco smoke. She has never used smokeless tobacco. She reports that she does not drink alcohol and does not use drugs.  ROS: For pertinent review of systems please refer to history of present illness  Physical Exam: BP 106/70   Pulse 79   Ht 5\' 2"  (1.575 m)   Wt 126 lb (57.2 kg)   LMP 07/01/2014 (Exact Date)   BMI 23.05 kg/m   Constitutional:  Well nourished. Alert and oriented, No acute distress. HEENT: [place] AT, moist mucus membranes.  Trachea midline Cardiovascular: No clubbing, cyanosis, or edema. Respiratory: Normal respiratory effort, no increased work of breathing. Neurologic: Grossly intact, no focal deficits, moving all 4 extremities. Psychiatric: Normal mood and affect.    Laboratory Data: Comprehensive Metabolic Panel (CMP) Order: [id number] Component Ref Range & Units 2 wk ago  Glucose 70 - 110 mg/dL 85  Sodium 308 - 657 mmol/L 142  Potassium 3.6 - 5.1 mmol/L 4.3  Chloride 97 - 109 mmol/L 104  Carbon Dioxide (CO2) 22.0 - 32.0 mmol/L 31.5  Urea Nitrogen (BUN) 7  - 25 mg/dL 11  Creatinine 0.6 - 1.1 mg/dL 0.8  Glomerular Filtration Rate (eGFR) >60 mL/min/1.73sq m 84  Comment: CKD-EPI (2021) does not include patient's race in the calculation of eGFR.  Monitoring changes of plasma creatinine and eGFR over time is useful for monitoring kidney function.  Interpretive Ranges for eGFR (CKD-EPI 2021):  eGFR:       >60 mL/min/1.73 sq. m - Normal eGFR:       30-59 mL/min/1.73 sq. m - Moderately Decreased eGFR:       15-29 mL/min/1.73 sq. m  - Severely Decreased eGFR:       < 15 mL/min/1.73 sq. m  - Kidney Failure   Note: These eGFR calculations do not apply in acute situations when eGFR is changing rapidly or patients on dialysis.  Calcium 8.7 - 10.3 mg/dL 9.4  AST 8 - 39 U/L 20  ALT 5 - 38 U/L 21  Alk Phos (alkaline Phosphatase) 34 - 104 U/L 67  Albumin 3.5 - 4.8 g/dL 4.4  Bilirubin, Total 0.3 - 1.2 mg/dL 0.5  Protein, Total 6.1 - 7.9 g/dL 6.4  A/G Ratio 1.0 - 5.0 gm/dL 2.2  Resulting Agency [facility] - LAB   Specimen Collected: 12/13/23 07:35   Performed by: [facility] Last Resulted: 12/13/23 16:39  Received From: [facility]  Result Received: 12/15/23 12:53    Hemoglobin A1C Order: [id number] Component Ref Range & Units 2 wk ago  Hemoglobin A1C 4.2 - 5.6 % 5.8 High   Average Blood Glucose (Calc) mg/dL 841  Resulting Agency [facility] - [facility]  Narrative Performed by [facility] Normal Range:    4.2 - 5.6% Increased Risk:  5.7 - 6.4% Diabetes:        >= 6.5% Glycemic Control for adults with diabetes:  <7%    Specimen Collected: 12/13/23 07:35   Performed by: [facility] Last Resulted: 12/13/23 08:53  Received From: [facility]  Result Received: 12/15/23 12:53  I have reviewed the labs.    Pertinent Imaging: N/A  Assessment & Plan:    1. rUTI's -Continue Macrodantin  50 mg 3 days weekly, refills given  -Notify us  any  breakthrough infections  2. Vaginal atrophy Cannot use vaginal estrogen cream due to endometrial bleeding  3. Cystocele - Not bothersome     - Given handout and instructional video on Kegel exercises  4. Urge incontinence - Secondary to postponing urination, not bothersome at this time                              Return in about 1 year (around 12/27/2024) for OAB questionnaire.  These notes generated with voice recognition software. I apologize for typographical errors.  Briant Camper  [facility] [address] [place], [place] [address] [phone]

## 2023-12-29 ENCOUNTER — Ambulatory Visit
Admission: RE | Admit: 2023-12-29 | Discharge: 2023-12-29 | Disposition: A | Discharge status: Home / Self Care | Payer: Self-pay | Source: Ambulatory Visit | Attending: Physician Assistant | Admitting: Physician Assistant

## 2023-12-29 DIAGNOSIS — I2510 Atherosclerotic heart disease of native coronary artery without angina pectoris: Secondary | ICD-10-CM | POA: Insufficient documentation

## 2023-12-29 DIAGNOSIS — I471 Supraventricular tachycardia, unspecified: Secondary | ICD-10-CM | POA: Insufficient documentation

## 2023-12-29 DIAGNOSIS — E7800 Pure hypercholesterolemia, unspecified: Secondary | ICD-10-CM | POA: Insufficient documentation

## 2023-12-29 DIAGNOSIS — Z8249 Family history of ischemic heart disease and other diseases of the circulatory system: Secondary | ICD-10-CM | POA: Insufficient documentation

## 2023-12-30 ENCOUNTER — Telehealth (HOSPITAL_BASED_OUTPATIENT_CLINIC_OR_DEPARTMENT_OTHER): Payer: Self-pay | Admitting: Orthopaedic Surgery

## 2023-12-30 ENCOUNTER — Ambulatory Visit (HOSPITAL_BASED_OUTPATIENT_CLINIC_OR_DEPARTMENT_OTHER): Admitting: Orthopaedic Surgery

## 2023-12-30 ENCOUNTER — Other Ambulatory Visit: Payer: Self-pay

## 2023-12-30 ENCOUNTER — Other Ambulatory Visit (HOSPITAL_BASED_OUTPATIENT_CLINIC_OR_DEPARTMENT_OTHER): Payer: Self-pay

## 2023-12-30 ENCOUNTER — Ambulatory Visit (HOSPITAL_BASED_OUTPATIENT_CLINIC_OR_DEPARTMENT_OTHER): Payer: Self-pay | Admitting: Orthopaedic Surgery

## 2023-12-30 DIAGNOSIS — M67821 Other specified disorders of synovium, right elbow: Secondary | ICD-10-CM | POA: Diagnosis not present

## 2023-12-30 MED ORDER — ASPIRIN 325 MG PO TBEC
325.0000 mg | DELAYED_RELEASE_TABLET | Freq: Every day | ORAL | 0 refills | Status: DC
  Filled 2023-12-30 (×2): qty 14, 14d supply, fill #0

## 2023-12-30 MED ORDER — IBUPROFEN 800 MG PO TABS
800.0000 mg | ORAL_TABLET | Freq: Three times a day (TID) | ORAL | 0 refills | Status: AC
  Filled 2023-12-30 (×2): qty 30, 10d supply, fill #0

## 2023-12-30 MED ORDER — OXYCODONE HCL 5 MG PO TABS
5.0000 mg | ORAL_TABLET | ORAL | 0 refills | Status: DC | PRN
  Filled 2023-12-30 (×2): qty 5, 1d supply, fill #0

## 2023-12-30 MED ORDER — ACETAMINOPHEN 500 MG PO TABS
500.0000 mg | ORAL_TABLET | Freq: Three times a day (TID) | ORAL | 0 refills | Status: AC
  Filled 2023-12-30 (×2): qty 30, 10d supply, fill #0

## 2023-12-30 NOTE — Telephone Encounter (Signed)
Surgery info

## 2023-12-30 NOTE — Progress Notes (Signed)
 Chief Complaint: Right elbow pain     History of Present Illness:    [name] is a 63 y.o. female presents today with ongoing right elbow pain.  She has been seen by Dr. Alease Hunter.  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  She also does have evidence of lateral epicondylitis.  She has trialed bracing as well as injections without persistent relief.  She has trialed occupational therapy as well.  She is here today for further discussion.  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  She does play the piano for congregation and has an upcoming wedding    PMH/PSH/Family History/Social History/Meds/Allergies:    Past Medical History:  Diagnosis Date   Allergy    Arthritis    osteoarthritis - fingers   Asthma    Chicken pox    Chronic UTI    Cystocele    2nd degree   Endometriosis    Family history of polyps in the colon    Graves disease    History of heavy periods    Hole in the ear drum, right    Hyperlipemia    IBS (irritable bowel syndrome)    Perimenopausal    PONV (postoperative nausea and vomiting)    nausea only   Rectocele    mild   S/P endometrial ablation    Scoliosis    Skin cancer    SUI (stress urinary incontinence, female)    SVT (supraventricular tachycardia) (HCC)    Urine incontinence    Wears contact lenses    Wears hearing aid in both ears    Past Surgical History:  Procedure Laterality Date   CATARACT EXTRACTION W/PHACO Right 11/03/2021   Procedure: CATARACT EXTRACTION PHACO AND INTRAOCULAR LENS PLACEMENT (IOC) RIGHT;  Surgeon: Clair Crews, MD;  Location: [facility];  Service: Ophthalmology;  Laterality: Right;  2.98 0:27.0   CATARACT EXTRACTION W/PHACO Left 12/22/2021   Procedure: CATARACT EXTRACTION PHACO AND INTRAOCULAR LENS PLACEMENT (IOC) LEFT 2.38 00:26.0;  Surgeon: Clair Crews, MD;  Location: [facility];  Service:  Ophthalmology;  Laterality: Left;   COLONOSCOPY WITH PROPOFOL  N/A 07/10/2018   Procedure: COLONOSCOPY WITH PROPOFOL ;  Surgeon: Cassie Click, MD;  Location: [facility] ENDOSCOPY;  Service: Endoscopy;  Laterality: N/A;   COLONOSCOPY WITH PROPOFOL  N/A 07/11/2023   Procedure: COLONOSCOPY WITH PROPOFOL ;  Surgeon: Luke Salaam, MD;  Location: [facility] ENDOSCOPY;  Service: Gastroenterology;  Laterality: N/A;   endometrrial ablation     EYE SURGERY     knee athroscopy Left 2013   laprascopic surgery     endometreosis   NASOPHARYNGOSCOPY EUSTATION TUBE BALLOON DILATION Bilateral 05/18/2018   Procedure: NASOPHARYNGOSCOPY EUSTATION TUBE BALLOON DILATION OUTFRACTURE BILATERAL INFERIOR TURBINATES;  Surgeon: Mellody Sprout, MD;  Location: [facility];  Service: ENT;  Laterality: Bilateral;   NASOPHARYNGOSCOPY EUSTATION TUBE BALLOON DILATION     nova-sure     POLYPECTOMY  07/11/2023   Procedure: POLYPECTOMY;  Surgeon: Luke Salaam, MD;  Location: [facility] ENDOSCOPY;  Service: Gastroenterology;;   THYROIDECTOMY, PARTIAL     TONSILLECTOMY AND ADENOIDECTOMY     tubes in ears     Social History   Socioeconomic History   Marital status: Married    Spouse name: Not on file   Number of children: Not on file   Years of education: Not on file   Highest education level: Not on file  Occupational History  Occupation: Surveyor, minerals  Tobacco Use   Smoking status: Never    Passive exposure: Never   Smokeless tobacco: Never  Vaping Use   Vaping status: Never Used  Substance and Sexual Activity   Alcohol use: No    Alcohol/week: 0.0 standard drinks of alcohol   Drug use: No   Sexual activity: Yes    Birth control/protection: Post-menopausal  Other Topics Concern   Not on file  Social History Narrative   Patient is a Engineer, civil (consulting) :)   Social Drivers of Health   Financial Resource Strain: Low Risk  (12/17/2023)   Received from [facility]   Overall Financial Resource Strain (CARDIA)     Difficulty of Paying Living Expenses: Not hard at all  Food Insecurity: No Food Insecurity (12/17/2023)   Received from [facility]   Hunger Vital Sign    Worried About Running Out of Food in the Last Year: Never true    Ran Out of Food in the Last Year: Never true  Transportation Needs: No Transportation Needs (12/17/2023)   Received from [facility] - Transportation    In the past 12 months, has lack of transportation kept you from medical appointments or from getting medications?: No    Lack of Transportation (Non-Medical): No  Physical Activity: Sufficiently Active (04/19/2018)   Exercise Vital Sign    Days of Exercise per Week: 4 days    Minutes of Exercise per Session: 40 min  Stress: Not on file  Social Connections: Not on file   Family History  Problem Relation Age of Onset   Cancer Mother    Hyperlipidemia Mother    Heart disease Mother    Hypertension Mother    Breast cancer Mother 74   Kidney failure Mother    Stroke Mother    Cancer Father        colon   Hyperlipidemia Father    Heart disease Father    Hypertension Father    Breast cancer Maternal Aunt 85   Breast cancer Paternal Aunt 56   Kidney cancer Neg Hx    Bladder Cancer Neg Hx    Prostate cancer Neg Hx    Ovarian cancer Neg Hx    Diabetes Neg Hx    Allergies  Allergen Reactions   Ciprofloxacin Anaphylaxis   Floxin [Ofloxacin] Anaphylaxis   Levofloxacin Anaphylaxis   Quinolones Anaphylaxis   Amoxicillin     fever   Bextra [Valdecoxib]    Gyne-Lotrimin [Clotrimazole]     redness   Penicillins Rash   Septra [Sulfamethoxazole-Trimethoprim ] Rash    Flushed and fever.   Sulfa Antibiotics Rash    fever   Current Outpatient Medications  Medication Sig Dispense Refill   acetaminophen  (TYLENOL ) 500 MG tablet Take 1 tablet (500 mg total) by mouth every 8 (eight) hours for 10 days. 30 tablet 0   aspirin EC 325 MG tablet Take 1 tablet (325 mg total) by mouth  daily. 14 tablet 0   ibuprofen (ADVIL) 800 MG tablet Take 1 tablet (800 mg total) by mouth every 8 (eight) hours for 10 days. Please take with food, please alternate with acetaminophen  30 tablet 0   oxyCODONE  (ROXICODONE ) 5 MG immediate release tablet Take 1 tablet (5 mg total) by mouth every 4 (four) hours as needed for severe pain (pain score 7-10) or breakthrough pain. 5 tablet 0   albuterol  (VENTOLIN  HFA) 108 (90 Base) MCG/ACT inhaler Inhale 2 inhalations into the  lungs every 4 (four) hours as needed for Wheezing 18 g 1   cholecalciferol (VITAMIN D) 1000 UNITS tablet Take 1,000 Units by mouth daily.     EPINEPHrine  0.3 mg/0.3 mL IJ SOAJ injection Inject 0.3 mLs (0.3 mg total) into the muscle once as needed for up to 1 dose. 2 each 1   fexofenadine (ALLEGRA) 180 MG tablet Allegra Allergy 180 mg tablet  Take 1 tablet every day by oral route.     fluticasone  (FLONASE ) 50 MCG/ACT nasal spray Spray 2 sprays into both nostrils once daily as needed. 48 g 1   imipramine  (TOFRANIL ) 25 MG tablet Take 2 tablets (50 mg total) by mouth at bedtime. 200 tablet 1   levothyroxine  (SYNTHROID ) 100 MCG tablet Take 1 tablet (100 mcg total) by mouth daily. 90 tablet 1   lovastatin  (MEVACOR ) 20 MG tablet Take 1 tablet (20 mg total) by mouth daily with supper. 100 tablet 1   metoprolol  succinate (TOPROL -XL) 50 MG 24 hr tablet Take 1 tablet (50 mg total) by mouth daily. 90 tablet 3   montelukast  (SINGULAIR ) 10 MG tablet Take 1 tablet (10 mg total) by mouth at bedtime 90 tablet 1   Multiple Vitamins-Minerals (MULTIVITAMIN ADULT) CHEW Chew 1 tablet by mouth daily.     nitrofurantoin  (MACRODANTIN ) 50 MG capsule Take 1 capsule (50 mg total) by mouth daily. 90 capsule 1   No current facility-administered medications for this visit.   CT CARDIAC SCORING (SELF PAY ONLY) Result Date: 12/30/2023 CLINICAL DATA:  Risk stratification EXAM: Coronary Calcium Score TECHNIQUE: The patient was scanned on a Siemens Somatom scanner.  Axial non-contrast 3 mm slices were carried out through the heart. The data set was analyzed on a dedicated work station and scored using the Agatson method. FINDINGS: Non-cardiac: See separate report from [facility]. Ascending Aorta: Normal size Pericardium: Normal Coronary arteries: Normal origin of left and right coronary arteries. Distribution of arterial calcifications if present, as noted below; LM 0 LAD 123 LCx 0 RCA 0 Total 123 IMPRESSION AND RECOMMENDATION: 1. Coronary calcium score of 123. This was 90th percentile for age and sex matched control. 2. CAC 100-299 in LAD. CAC-DRS A2/N1. 3. Recommend aspirin and statin if no contraindication. 4. Continue heart healthy lifestyle and risk factor modification. Electronically Signed   By: Constancia Delton M.D.   On: 12/30/2023 08:20    Review of Systems:   A ROS was performed including pertinent positives and negatives as documented in the HPI.  Physical Exam :   Constitutional: NAD and appears stated age Neurological: Alert and oriented Psych: Appropriate affect and cooperative Last menstrual period 07/01/2014.   Comprehensive Musculoskeletal Exam:    Right elbow with clicking and popping about the posterior radiocapitellar joint particularly if she goes into hyperextension.  There is tenderness about the lateral epicondyle which does reproduce pain down the extensor wad with resisted extension.  She otherwise has full painless range of motion about the hand.  Range of motion about the elbow is 0 to 140 degrees without any varus or valgus instability   Imaging:   Xray (4 views right elbow): Normal  MRI (right elbow): Significant plical thickening involving the radial capitellar joint   I personally reviewed and interpreted the radiographs.   Assessment and Plan:   63 y.o. female with a synovial plica of the right elbow which has become symptomatic and popping.  I did describe as well that she does have evidence of lateral  epicondylitis.  Both of these  were now failed injection as well as occupational therapy.  Given this I did discuss the possibility of elbow arthroscopy with plica excision as well as a tennis elbow debridement.  I did discuss the risks and benefits as well as limitations associated with this.  After discussion she has elected to proceed  -Plan for right elbow arthroscopy with plica excision and tennis elbow release   After a lengthy discussion of treatment options, including risks, benefits, alternatives, complications of surgical and nonsurgical conservative options, the patient elected surgical repair.   The patient  is aware of the material risks  and complications including, but not limited to injury to adjacent structures, neurovascular injury, infection, numbness, bleeding, implant failure, thermal burns, stiffness, persistent pain, failure to heal, disease transmission from allograft, need for further surgery, dislocation, anesthetic risks, blood clots, risks of death,and others. The probabilities of surgical success and failure discussed with patient given their particular co-morbidities.The time and nature of expected rehabilitation and recovery was discussed.The patient's questions were all answered preoperatively.  No barriers to understanding were noted. I explained the natural history of the disease process and Rx rationale.  I explained to the patient what I considered to be reasonable expectations given their personal situation.  The final treatment plan was arrived at through a shared patient decision making process model.   I personally saw and evaluated the patient, and participated in the management and treatment plan.  Wilhelmenia Harada, MD Attending Physician, Orthopedic Surgery  This document was dictated using Dragon voice recognition software. A reasonable attempt at proof reading has been made to minimize errors.

## 2023-12-30 NOTE — Addendum Note (Signed)
 Addended by: Albesa Huguenin on: 12/30/2023 11:31 AM   Modules accepted: Orders

## 2024-01-02 ENCOUNTER — Other Ambulatory Visit: Payer: Self-pay

## 2024-01-04 ENCOUNTER — Ambulatory Visit: Payer: Self-pay | Admitting: Urology

## 2024-01-12 ENCOUNTER — Telehealth: Payer: Self-pay | Admitting: Orthopaedic Surgery

## 2024-01-12 NOTE — Telephone Encounter (Signed)
 I emailed auth to patient at [email or web address] Patient will email back to me along with her [employer] forms. She will be mailing in the $20 form fee to our office at [facility] [address] which I will watch for.

## 2024-01-15 ENCOUNTER — Other Ambulatory Visit: Payer: Self-pay

## 2024-01-16 ENCOUNTER — Other Ambulatory Visit: Payer: Self-pay

## 2024-01-16 MED ORDER — MONTELUKAST SODIUM 10 MG PO TABS
10.0000 mg | ORAL_TABLET | Freq: Every day | ORAL | 1 refills | Status: DC
  Filled 2024-01-16: qty 90, 90d supply, fill #0
  Filled 2024-04-15: qty 90, 90d supply, fill #1

## 2024-01-17 ENCOUNTER — Other Ambulatory Visit: Payer: Self-pay

## 2024-01-17 MED ORDER — LEVOTHYROXINE SODIUM 100 MCG PO TABS
100.0000 ug | ORAL_TABLET | Freq: Every day | ORAL | 1 refills | Status: DC
  Filled 2024-01-17: qty 90, 90d supply, fill #0
  Filled 2024-04-22: qty 90, 90d supply, fill #1

## 2024-01-26 ENCOUNTER — Telehealth: Payer: Self-pay | Admitting: Orthopaedic Surgery

## 2024-01-26 NOTE — Telephone Encounter (Signed)
 Pt paid check for disability forms. Pt phone number is [phone].[name] has forms and medical release form

## 2024-01-30 ENCOUNTER — Encounter (HOSPITAL_BASED_OUTPATIENT_CLINIC_OR_DEPARTMENT_OTHER): Payer: Self-pay | Admitting: Orthopaedic Surgery

## 2024-01-30 ENCOUNTER — Other Ambulatory Visit: Payer: Self-pay

## 2024-02-01 ENCOUNTER — Encounter (HOSPITAL_BASED_OUTPATIENT_CLINIC_OR_DEPARTMENT_OTHER)
Admission: RE | Admit: 2024-02-01 | Discharge: 2024-02-01 | Disposition: A | Discharge status: Home / Self Care | Source: Ambulatory Visit | Attending: Orthopaedic Surgery | Admitting: Orthopaedic Surgery

## 2024-02-01 ENCOUNTER — Other Ambulatory Visit: Payer: Self-pay

## 2024-02-01 DIAGNOSIS — Z01810 Encounter for preprocedural cardiovascular examination: Secondary | ICD-10-CM | POA: Diagnosis not present

## 2024-02-01 NOTE — Progress Notes (Signed)
 Ensure presurgery (drink at 0800) and CHG soap given to pt with verbal and written instruction. Pt verbalized understanding.

## 2024-02-06 ENCOUNTER — Ambulatory Visit (HOSPITAL_BASED_OUTPATIENT_CLINIC_OR_DEPARTMENT_OTHER)
Admission: RE | Admit: 2024-02-06 | Discharge: 2024-02-06 | Disposition: A | Discharge status: Home / Self Care | Source: Ambulatory Visit | Attending: Orthopaedic Surgery | Admitting: Orthopaedic Surgery

## 2024-02-06 ENCOUNTER — Encounter (HOSPITAL_BASED_OUTPATIENT_CLINIC_OR_DEPARTMENT_OTHER): Payer: Self-pay | Admitting: Orthopaedic Surgery

## 2024-02-06 ENCOUNTER — Ambulatory Visit (HOSPITAL_BASED_OUTPATIENT_CLINIC_OR_DEPARTMENT_OTHER): Admitting: Anesthesiology

## 2024-02-06 ENCOUNTER — Encounter (HOSPITAL_BASED_OUTPATIENT_CLINIC_OR_DEPARTMENT_OTHER)
Admission: RE | Disposition: A | Discharge status: Home / Self Care | Payer: Self-pay | Source: Ambulatory Visit | Attending: Orthopaedic Surgery

## 2024-02-06 ENCOUNTER — Other Ambulatory Visit: Payer: Self-pay

## 2024-02-06 DIAGNOSIS — I10 Essential (primary) hypertension: Secondary | ICD-10-CM | POA: Insufficient documentation

## 2024-02-06 DIAGNOSIS — Z7982 Long term (current) use of aspirin: Secondary | ICD-10-CM | POA: Insufficient documentation

## 2024-02-06 DIAGNOSIS — E039 Hypothyroidism, unspecified: Secondary | ICD-10-CM | POA: Diagnosis not present

## 2024-02-06 DIAGNOSIS — M7711 Lateral epicondylitis, right elbow: Secondary | ICD-10-CM | POA: Insufficient documentation

## 2024-02-06 DIAGNOSIS — Z79899 Other long term (current) drug therapy: Secondary | ICD-10-CM | POA: Diagnosis not present

## 2024-02-06 DIAGNOSIS — M1990 Unspecified osteoarthritis, unspecified site: Secondary | ICD-10-CM | POA: Insufficient documentation

## 2024-02-06 DIAGNOSIS — M67821 Other specified disorders of synovium, right elbow: Secondary | ICD-10-CM | POA: Insufficient documentation

## 2024-02-06 DIAGNOSIS — J45909 Unspecified asthma, uncomplicated: Secondary | ICD-10-CM | POA: Diagnosis not present

## 2024-02-06 DIAGNOSIS — G8918 Other acute postprocedural pain: Secondary | ICD-10-CM | POA: Diagnosis not present

## 2024-02-06 DIAGNOSIS — Z79890 Hormone replacement therapy (postmenopausal): Secondary | ICD-10-CM | POA: Insufficient documentation

## 2024-02-06 DIAGNOSIS — I472 Ventricular tachycardia, unspecified: Secondary | ICD-10-CM | POA: Diagnosis not present

## 2024-02-06 DIAGNOSIS — I2510 Atherosclerotic heart disease of native coronary artery without angina pectoris: Secondary | ICD-10-CM

## 2024-02-06 HISTORY — PX: ELBOW ARTHROSCOPY: SHX614

## 2024-02-06 HISTORY — PX: SYNOVECTOMY: SHX5180

## 2024-02-06 SURGERY — ARTHROSCOPY, ELBOW
Anesthesia: Regional | Site: Elbow | Laterality: Right

## 2024-02-06 MED ORDER — CEFAZOLIN SODIUM-DEXTROSE 2-4 GM/100ML-% IV SOLN
2.0000 g | INTRAVENOUS | Status: AC
  Administered 2024-02-06: 2 g via INTRAVENOUS

## 2024-02-06 MED ORDER — MIDAZOLAM HCL 2 MG/2ML IJ SOLN
INTRAMUSCULAR | Status: AC
  Filled 2024-02-06: qty 2

## 2024-02-06 MED ORDER — SUGAMMADEX SODIUM 200 MG/2ML IV SOLN
INTRAVENOUS | Status: DC | PRN
  Administered 2024-02-06: 200 mg via INTRAVENOUS

## 2024-02-06 MED ORDER — ACETAMINOPHEN 500 MG PO TABS: 1000.0000 mg | ORAL_TABLET | Freq: Once | ORAL | Status: AC

## 2024-02-06 MED ORDER — GABAPENTIN 300 MG PO CAPS
ORAL_CAPSULE | ORAL | Status: AC
  Filled 2024-02-06: qty 1

## 2024-02-06 MED ORDER — ROPIVACAINE HCL 5 MG/ML IJ SOLN
INTRAMUSCULAR | Status: DC | PRN
  Administered 2024-02-06: 20 mL via PERINEURAL

## 2024-02-06 MED ORDER — DEXAMETHASONE SODIUM PHOSPHATE 4 MG/ML IJ SOLN
INTRAMUSCULAR | Status: DC | PRN
  Administered 2024-02-06: 5 mg via INTRAVENOUS

## 2024-02-06 MED ORDER — LACTATED RINGERS IV SOLN: INTRAVENOUS | Status: DC

## 2024-02-06 MED ORDER — CEFAZOLIN SODIUM-DEXTROSE 2-4 GM/100ML-% IV SOLN
INTRAVENOUS | Status: AC
  Filled 2024-02-06: qty 100

## 2024-02-06 MED ORDER — ONDANSETRON HCL 4 MG/2ML IJ SOLN: 4.0000 mg | Freq: Once | INTRAMUSCULAR | Status: DC | PRN

## 2024-02-06 MED ORDER — TRANEXAMIC ACID-NACL 1000-0.7 MG/100ML-% IV SOLN
1000.0000 mg | INTRAVENOUS | Status: AC
  Administered 2024-02-06: 1000 mg via INTRAVENOUS

## 2024-02-06 MED ORDER — ROCURONIUM BROMIDE 100 MG/10ML IV SOLN
INTRAVENOUS | Status: DC | PRN
  Administered 2024-02-06: 40 mg via INTRAVENOUS

## 2024-02-06 MED ORDER — SODIUM CHLORIDE 0.9 % IR SOLN
Status: DC | PRN
  Administered 2024-02-06: 3000 mL

## 2024-02-06 MED ORDER — PHENYLEPHRINE HCL (PRESSORS) 10 MG/ML IV SOLN
INTRAVENOUS | Status: DC | PRN
  Administered 2024-02-06 (×2): 80 ug via INTRAVENOUS

## 2024-02-06 MED ORDER — HYDROMORPHONE HCL 1 MG/ML IJ SOLN: 0.2500 mg | INTRAMUSCULAR | Status: DC | PRN

## 2024-02-06 MED ORDER — LIDOCAINE HCL (CARDIAC) PF 100 MG/5ML IV SOSY
PREFILLED_SYRINGE | INTRAVENOUS | Status: DC | PRN
  Administered 2024-02-06: 40 mg via INTRAVENOUS

## 2024-02-06 MED ORDER — AMISULPRIDE (ANTIEMETIC) 5 MG/2ML IV SOLN: 10.0000 mg | Freq: Once | INTRAVENOUS | Status: DC | PRN

## 2024-02-06 MED ORDER — FENTANYL CITRATE (PF) 100 MCG/2ML IJ SOLN
100.0000 ug | Freq: Once | INTRAMUSCULAR | Status: AC
  Administered 2024-02-06: 50 ug via INTRAVENOUS

## 2024-02-06 MED ORDER — ACETAMINOPHEN 500 MG PO TABS
1000.0000 mg | ORAL_TABLET | Freq: Once | ORAL | Status: AC
  Administered 2024-02-06: 1000 mg via ORAL

## 2024-02-06 MED ORDER — OXYCODONE HCL 5 MG PO TABS: 5.0000 mg | ORAL_TABLET | Freq: Once | ORAL | Status: DC | PRN

## 2024-02-06 MED ORDER — TRANEXAMIC ACID-NACL 1000-0.7 MG/100ML-% IV SOLN
INTRAVENOUS | Status: AC
  Filled 2024-02-06: qty 100

## 2024-02-06 MED ORDER — FENTANYL CITRATE (PF) 100 MCG/2ML IJ SOLN
INTRAMUSCULAR | Status: AC
Start: 2024-02-06 — End: 2024-02-06
  Filled 2024-02-06: qty 2

## 2024-02-06 MED ORDER — MIDAZOLAM HCL 2 MG/2ML IJ SOLN
2.0000 mg | Freq: Once | INTRAMUSCULAR | Status: AC
  Administered 2024-02-06: 2 mg via INTRAVENOUS

## 2024-02-06 MED ORDER — ACETAMINOPHEN 500 MG PO TABS
ORAL_TABLET | ORAL | Status: AC
  Filled 2024-02-06: qty 2

## 2024-02-06 MED ORDER — FENTANYL CITRATE (PF) 100 MCG/2ML IJ SOLN
INTRAMUSCULAR | Status: DC | PRN
  Administered 2024-02-06: 50 ug via INTRAVENOUS

## 2024-02-06 MED ORDER — EPHEDRINE SULFATE (PRESSORS) 50 MG/ML IJ SOLN
INTRAMUSCULAR | Status: DC | PRN
Start: 2024-02-06 — End: 2024-02-06
  Administered 2024-02-06 (×2): 5 mg via INTRAVENOUS

## 2024-02-06 MED ORDER — OXYCODONE HCL 5 MG/5ML PO SOLN: 5.0000 mg | Freq: Once | ORAL | Status: DC | PRN

## 2024-02-06 MED ORDER — PROPOFOL 10 MG/ML IV BOLUS
INTRAVENOUS | Status: DC | PRN
  Administered 2024-02-06: 150 mg via INTRAVENOUS

## 2024-02-06 MED ORDER — GABAPENTIN 300 MG PO CAPS
300.0000 mg | ORAL_CAPSULE | Freq: Once | ORAL | Status: AC
  Administered 2024-02-06: 300 mg via ORAL

## 2024-02-06 MED ORDER — PROPOFOL 500 MG/50ML IV EMUL
INTRAVENOUS | Status: AC
  Filled 2024-02-06: qty 50

## 2024-02-06 MED ORDER — DEXAMETHASONE SODIUM PHOSPHATE 10 MG/ML IJ SOLN
INTRAMUSCULAR | Status: DC | PRN
  Administered 2024-02-06: 10 mg

## 2024-02-06 MED ORDER — MIDAZOLAM HCL 2 MG/2ML IJ SOLN
INTRAMUSCULAR | Status: AC
Start: 2024-02-06 — End: 2024-02-06
  Filled 2024-02-06: qty 2

## 2024-02-06 SURGICAL SUPPLY — 39 items
BLADE EXCALIBUR 4.0X13 (MISCELLANEOUS) IMPLANT
BLADE SURG 15 STRL LF DISP TIS (BLADE) IMPLANT
BNDG ELASTIC 4INX 5YD STR LF (GAUZE/BANDAGES/DRESSINGS) ×2 IMPLANT
BNDG ELASTIC 6INX 5YD STR LF (GAUZE/BANDAGES/DRESSINGS) IMPLANT
CHLORAPREP W/TINT 26 (MISCELLANEOUS) ×1 IMPLANT
COVER MAYO STAND STRL (DRAPES) ×1 IMPLANT
DISSECTOR 3.8MM X 13CM (MISCELLANEOUS) ×1 IMPLANT
DRAPE ARTHROSCOPY W/POUCH 90 (DRAPES) ×1 IMPLANT
DRAPE IMP U-DRAPE 54X76 (DRAPES) ×1 IMPLANT
DRAPE INCISE IOBAN 66X45 STRL (DRAPES) IMPLANT
DRAPE U-SHAPE 47X51 STRL (DRAPES) ×1 IMPLANT
DW OUTFLOW CASSETTE/TUBE SET (MISCELLANEOUS) IMPLANT
ELECTRODE REM PT RTRN 9FT ADLT (ELECTROSURGICAL) ×1 IMPLANT
EXCALIBUR 3.8MM X 13CM (MISCELLANEOUS) IMPLANT
GAUZE PAD ABD 8X10 STRL (GAUZE/BANDAGES/DRESSINGS) ×1 IMPLANT
GAUZE SPONGE 4X4 12PLY STRL (GAUZE/BANDAGES/DRESSINGS) ×1 IMPLANT
GAUZE XEROFORM 1X8 LF (GAUZE/BANDAGES/DRESSINGS) IMPLANT
GLOVE BIO SURGEON STRL SZ 6 (GLOVE) ×1 IMPLANT
GLOVE BIO SURGEON STRL SZ7.5 (GLOVE) ×1 IMPLANT
GLOVE BIOGEL PI IND STRL 6.5 (GLOVE) ×1 IMPLANT
GLOVE BIOGEL PI IND STRL 8 (GLOVE) ×1 IMPLANT
GOWN STRL REUS W/ TWL LRG LVL3 (GOWN DISPOSABLE) ×1 IMPLANT
GOWN STRL REUS W/ TWL XL LVL3 (GOWN DISPOSABLE) ×1 IMPLANT
MANIFOLD NEPTUNE II (INSTRUMENTS) ×1 IMPLANT
NDL SUT 6 .5 CRC .975X.05 MAYO (NEEDLE) IMPLANT
PACK ARTHROSCOPY DSU (CUSTOM PROCEDURE TRAY) ×1 IMPLANT
PACK BASIN DAY SURGERY FS (CUSTOM PROCEDURE TRAY) ×1 IMPLANT
PADDING CAST COTTON 6X4 STRL (CAST SUPPLIES) IMPLANT
PENCIL SMOKE EVACUATOR (MISCELLANEOUS) IMPLANT
SHAVER DISSECTOR 3.0 (BURR) IMPLANT
SHAVER SABRE 2.0 (BURR) IMPLANT
SLEEVE SCD COMPRESS KNEE MED (STOCKING) ×1 IMPLANT
SLING ARM FOAM STRAP MED (SOFTGOODS) IMPLANT
SPLINT FIBERGLASS 4X30 (CAST SUPPLIES) IMPLANT
SUT ETHILON 3 0 PS 1 (SUTURE) ×1 IMPLANT
SUT VIC AB 3-0 FS2 27 (SUTURE) IMPLANT
TOWEL GREEN STERILE FF (TOWEL DISPOSABLE) ×1 IMPLANT
TUBING ARTHROSCOPY IRRIG 16FT (MISCELLANEOUS) ×1 IMPLANT
WAND ABLATOR APOLLO I90 (BUR) IMPLANT

## 2024-02-06 NOTE — Transfer of Care (Signed)
 Immediate Anesthesia Transfer of Care Note  Patient: [name]  Procedure(s) Performed: ARTHROSCOPY, ELBOW, WITH OPEN SURGERY IF INDICATED (Right: Elbow) SYNOVECTOMY (Right: Elbow)  Patient Location: PACU  Anesthesia Type:General  Level of Consciousness: awake and patient cooperative  Airway & Oxygen Therapy: Patient Spontanous Breathing and Patient connected to nasal cannula oxygen  Post-op Assessment: Report given to RN and Post -op Vital signs reviewed and stable  Post vital signs: Reviewed and stable  Last Vitals:  Vitals Value Taken Time  BP 113/78 02/06/24 13:13  Temp 36.2 C 02/06/24 13:13  Pulse 76 02/06/24 13:14  Resp 18 02/06/24 13:14  SpO2 99 % 02/06/24 13:14  Vitals shown include unfiled device data.  Last Pain:  Vitals:   02/06/24 1021  TempSrc:   PainSc: 0-No pain         Complications: No notable events documented.

## 2024-02-06 NOTE — Anesthesia Procedure Notes (Signed)
 Anesthesia Regional Block: Supraclavicular block   Pre-Anesthetic Checklist: , timeout performed,  Correct Patient, Correct Site, Correct Laterality,  Correct Procedure, Correct Position, site marked,  Risks and benefits discussed,  Surgical consent,  Pre-op evaluation,  At surgeon's request and post-op pain management  Laterality: Right  Prep: Maximum Sterile Barrier Precautions used, chloraprep       Needles:  Injection technique: Single-shot  Needle Type: Echogenic Stimulator Needle     Needle Length: 9cm  Needle Gauge: 22     Additional Needles:   Procedures:,,,, ultrasound used (permanent image in chart),,    Narrative:  Start time: 02/06/2024 11:25 AM End time: 02/06/2024 11:30 AM Injection made incrementally with aspirations every 5 mL.  Performed by: Personally  Anesthesiologist: Jacquelyne Matte, DO  Additional Notes: Monitors applied. No increased pain on injection. No increased resistance to injection. Injection made in 5cc increments. Good needle visualization. Patient tolerated procedure well.

## 2024-02-06 NOTE — Op Note (Signed)
   Date of Surgery: 02/06/2024  INDICATIONS: Ms. [name] is a 63 y.o.-year-old female with right elbow tennis elbow and symptomatic plica.  The risk and benefits of the procedure were discussed in detail and documented in the pre-operative evaluation.   PREOPERATIVE DIAGNOSIS: 1.  Right elbow symptomatic plica with tennis elbow  POSTOPERATIVE DIAGNOSIS: Same.  PROCEDURE: 1. Right elbow arthroscopy with extensor carpi radialis brevis release  SURGEON: Carmina Chris MD  ASSISTANT: Deon Flatter, ATC  ANESTHESIA:  general  IV FLUIDS AND URINE: See anesthesia record.  ANTIBIOTICS: Ancef  ESTIMATED BLOOD LOSS: 10 mL.  IMPLANTS:  * No implants in log *  DRAINS: None  CULTURES: None  COMPLICATIONS: none  DESCRIPTION OF PROCEDURE:   The patient was identified in the preoperative holding area.  The correct site was marked according universal protocol nursing.  She is subsequently taken back to the operating room.  Anesthesia was induced.  Antibiotics were given 1 hour prior to skin incision.  She is prepped and draped in the lateral decubitus position with the back position and an axillary roll placed.  All bony prominences were padded including the down leg.   At this time I began with the right elbow arthroscopy.  The elbow was injected in the soft spot with 20 cc of normal saline.  The proximal ulnar portal was then created with 11 blade just through skin.  Care was taken to do this anteriorly out of the way of the ulnar nerve.  Hemostat was used to spread down to joint capsule.  The scope trocar was introduced.  At this time the radiocapitellar joint was visualized.  There was no chondral damage throughout.  There was a symptomatic appearing elbow plica.  The lateral portal proximally was made with direct visualization and needle placement.  11 blade was used to incise through the skin and there was that was distal spread down the capsule.  Eilene Grater was introduced and a thorough ECRB  debridement was performed with care not to go below the equator of the radial head anteriorly.  The plica was excised with a shaver.  Care was taken not to violate the muscle belly of the ECRL.  At this time I was happy with the debridement.  Wounds were closed with 3-0 nylon.  All counts were correct at the end of the case.  Soft dressing was applied with Xeroform Webril gauze Ace wrap.      POSTOPERATIVE PLAN: She will be activity as tolerated once her nerve block wears off.  She will begin elbow range of motion.  We will engage her with occupational therapy postop.  She was placed on 2 weeks of aspirin  for blood clot prevention  Carmina Chris, MD 2:41 PM

## 2024-02-06 NOTE — Anesthesia Postprocedure Evaluation (Signed)
 Anesthesia Post Note  Patient: Alisah Grandberry Arndt  Procedure(s) Performed: ARTHROSCOPY, ELBOW, WITH OPEN SURGERY IF INDICATED (Right: Elbow) SYNOVECTOMY (Right: Elbow)     Patient location during evaluation: PACU Anesthesia Type: Regional and General Level of consciousness: awake and alert, oriented and patient cooperative Pain management: pain level controlled Vital Signs Assessment: post-procedure vital signs reviewed and stable Respiratory status: spontaneous breathing, nonlabored ventilation and respiratory function stable Cardiovascular status: blood pressure returned to baseline and stable Postop Assessment: no apparent nausea or vomiting Anesthetic complications: no   No notable events documented.  Last Vitals:  Vitals:   02/06/24 1330 02/06/24 1337  BP: 115/83   Pulse: 81   Resp: 13   Temp:    SpO2: 95% 94%    Last Pain:  Vitals:   02/06/24 1337  TempSrc:   PainSc: 0-No pain                 Jacquelyne Matte

## 2024-02-06 NOTE — Discharge Instructions (Addendum)
 Discharge Instructions    Attending Surgeon: Wilhelmenia Harada, MD Office Phone Number: [phone]   Diagnosis and Procedures:    Surgeries Performed: Right elbow arthroscopy with ECRB release  Discharge Plan:    Diet: Resume usual diet. Begin with light or bland foods.  Drink plenty of fluids.  Activity:  Keep sling and dressing in place until your follow up visit in Physical Therapy You are advised to go home directly from the hospital or surgical center. Restrict your activities.  GENERAL INSTRUCTIONS: 1.  Keep your surgical site elevated above your heart for at least 5-7 days or longer to prevent swelling. This will improve your comfort and your overall recovery following surgery.     2. Please call Dr. Verline Glow office at [phone] with questions Monday-Friday during business hours. If no one answers, please leave a message and someone should get back to the patient within 24 hours. For emergencies please call 911 or proceed to the emergency room.   3. Patient to notify surgical team if experiences any of the following: Bowel/Bladder dysfunction, uncontrolled pain, nerve/muscle weakness, incision with increased drainage or redness, nausea/vomiting and Fever greater than 101.0 F.  Be alert for signs of infection including redness, streaking, odor, fever or chills. Be alert for excessive pain or bleeding and notify your surgeon immediately.  WOUND INSTRUCTIONS:   Leave your dressing/cast/splint in place until your post operative visit.  Keep it clean and dry.  Always keep the incision clean and dry until the staples/sutures are removed. If there is no drainage from the incision you should keep it open to air. If there is drainage from the incision you must keep it covered at all times until the drainage stops  Do not soak in a bath tub, hot tub, pool, lake or other body of water until 21 days after your surgery and your incision is completely dry and healed.  If you  have removable sutures (or staples) they must be removed 10-14 days (unless otherwise instructed) from the day of your surgery.     1)  Elevate the extremity as much as possible.  2)  Keep the dressing clean and dry.  3)  Please call us  if the dressing becomes wet or dirty.  4)  If you are experiencing worsening pain or worsening swelling, please call.     MEDICATIONS: Resume all previous home medications at the previous prescribed dose and frequency unless otherwise noted Start taking the  pain medications on an as-needed basis as prescribed  Please taper down pain medication over the next week following surgery.  Ideally you should not require a refill of any narcotic pain medication.  Take pain medication with food to minimize nausea. In addition to the prescribed pain medication, you may take over-the-counter pain relievers such as Tylenol .  Do NOT take additional tylenol  if your pain medication already has tylenol  in it.  Aspirin  325mg  daily per bottle instructions. Narcotic Policy: Per [facility] clinic policy, our goal is ensure optimal postoperative pain control with a multimodal pain management strategy. For all [facility] patients, our goal is to wean post-operative narcotic medications by 6 weeks post-operatively, and many times sooner. If this is not possible due to utilization of pain medication prior to surgery, your [facility] doctor will support your acute post-operative pain control for the first 6 weeks postoperatively, with a plan to transition you back to your primary pain team following that. Max Spain will work to ensure a Therapist, occupational.  FOLLOWUP INSTRUCTIONS: 1. Follow up at the Physical Therapy Clinic 3-4 days following surgery. This appointment should be scheduled unless other arrangements have been made.The Physical Therapy scheduling number is [phone] if an appointment has not already been arranged.  2. Contact Dr. Verline Glow office during office hours at  [phone] or the practice after hours line at [phone] for non-emergencies. For medical emergencies call 911.   Discharge Location: Home   No Tylenol  until 4:21 pm today, if needed.

## 2024-02-06 NOTE — H&P (Signed)
 Signed     Expand All Collapse All       Chief Complaint: Right elbow pain        History of Present Illness:      [name] is a 63 y.o. female presents today with ongoing right elbow pain.  She has been seen by Dr. Alease Hunter.  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  She also does have evidence of lateral epicondylitis.  She has trialed bracing as well as injections without persistent relief.  She has trialed occupational therapy as well.  She is here today for further discussion.  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  She does play the piano for congregation and has an upcoming wedding       PMH/PSH/Family History/Social History/Meds/Allergies:         Past Medical History:  Diagnosis Date   Allergy     Arthritis      osteoarthritis - fingers   Asthma     Chicken pox     Chronic UTI     Cystocele      2nd degree   Endometriosis     Family history of polyps in the colon     Graves disease     History of heavy periods     Hole in the ear drum, right     Hyperlipemia     IBS (irritable bowel syndrome)     Perimenopausal     PONV (postoperative nausea and vomiting)      nausea only   Rectocele      mild   S/P endometrial ablation     Scoliosis     Skin cancer     SUI (stress urinary incontinence, female)     SVT (supraventricular tachycardia) (HCC)     Urine incontinence     Wears contact lenses     Wears hearing aid in both ears               Past Surgical History:  Procedure Laterality Date   CATARACT EXTRACTION W/PHACO Right 11/03/2021    Procedure: CATARACT EXTRACTION PHACO AND INTRAOCULAR LENS PLACEMENT (IOC) RIGHT;  Surgeon: Clair Crews, MD;  Location[facility];  Service: Ophthalmology;  Laterality: Right;  2.98 0:27.0   CATARACT EXTRACTION W/PHACO Left 12/22/2021    Procedure: CATARACT EXTRACTION PHACO AND INTRAOCULAR LENS PLACEMENT (IOC)  LEFT 2.38 00:26.0;  Surgeon: Clair Crews, MD;  Location: [facility];  Service: Ophthalmology;  Laterality: Left;   COLONOSCOPY WITH PROPOFOL  N/A 07/10/2018    Procedure: COLONOSCOPY WITH PROPOFOL ;  Surgeon: Cassie Click, MD;  Location: [facility] ENDOSCOPY;  Service: Endoscopy;  Laterality: N/A;   COLONOSCOPY WITH PROPOFOL  N/A 07/11/2023    Procedure: COLONOSCOPY WITH PROPOFOL ;  Surgeon: Luke Salaam, MD;  Location: [facility] ENDOSCOPY;  Service: Gastroenterology;  Laterality: N/A;   endometrrial ablation       EYE SURGERY       knee athroscopy Left 2013   laprascopic surgery        endometreosis   NASOPHARYNGOSCOPY EUSTATION TUBE BALLOON DILATION Bilateral 05/18/2018    Procedure: NASOPHARYNGOSCOPY EUSTATION TUBE BALLOON DILATION OUTFRACTURE BILATERAL INFERIOR TURBINATES;  Surgeon: Mellody Sprout, MD;  Location: [facility];  Service: ENT;  Laterality: Bilateral;   NASOPHARYNGOSCOPY EUSTATION TUBE BALLOON DILATION       nova-sure       POLYPECTOMY   07/11/2023    Procedure: POLYPECTOMY;  Surgeon: Luke Salaam, MD;  Location: [facility] ENDOSCOPY;  Service: Gastroenterology;;   THYROIDECTOMY, PARTIAL       TONSILLECTOMY AND ADENOIDECTOMY       tubes in ears            Social History         Socioeconomic History   Marital status: Married      Spouse name: Not on file   Number of children: Not on file   Years of education: Not on file   Highest education level: Not on file  Occupational History   Occupation: [employer]  Tobacco Use   Smoking status: Never      Passive exposure: Never   Smokeless tobacco: Never  Vaping Use   Vaping status: Never Used  Substance and Sexual Activity   Alcohol use: No      Alcohol/week: 0.0 standard drinks of alcohol   Drug use: No   Sexual activity: Yes      Birth control/protection: Post-menopausal  Other Topics Concern   Not on file  Social History Narrative    Patient is a Engineer, civil (consulting) :)    Social Drivers of Health         Financial Resource Strain: Low Risk  (12/17/2023)    Received from [facility]    Overall Financial Resource Strain (CARDIA)     Difficulty of Paying Living Expenses: Not hard at all  Food Insecurity: No Food Insecurity (12/17/2023)    Received from [facility]    Hunger Vital Sign     Worried About Running Out of Food in the Last Year: Never true     Ran Out of Food in the Last Year: Never true  Transportation Needs: No Transportation Needs (12/17/2023)    Received from [facility] - [facility]     In the past 12 months, has lack of transportation kept you from medical appointments or from getting medications?: No     Lack of Transportation (Non-Medical): No  Physical Activity: Sufficiently Active (04/19/2018)    Exercise Vital Sign     Days of Exercise per Week: 4 days     Minutes of Exercise per Session: 40 min  Stress: Not on file  Social Connections: Not on file         Family History  Problem Relation Age of Onset   Cancer Mother     Hyperlipidemia Mother     Heart disease Mother     Hypertension Mother     Breast cancer Mother 21   Kidney failure Mother     Stroke Mother     Cancer Father          colon   Hyperlipidemia Father     Heart disease Father     Hypertension Father     Breast cancer Maternal Aunt 71   Breast cancer Paternal Aunt 31   Kidney cancer Neg Hx     Bladder Cancer Neg Hx     Prostate cancer Neg Hx     Ovarian cancer Neg Hx     Diabetes Neg Hx          Allergies       Allergies  Allergen Reactions   Ciprofloxacin Anaphylaxis   Floxin [Ofloxacin] Anaphylaxis   Levofloxacin Anaphylaxis   Quinolones Anaphylaxis   Amoxicillin        fever   Bextra [Valdecoxib]     Gyne-Lotrimin [Clotrimazole]  redness   Penicillins Rash   Septra [Sulfamethoxazole-Trimethoprim ] Rash      Flushed and fever.   Sulfa Antibiotics Rash      fever            Current Outpatient  Medications  Medication Sig Dispense Refill   acetaminophen  (TYLENOL ) 500 MG tablet Take 1 tablet (500 mg total) by mouth every 8 (eight) hours for 10 days. 30 tablet 0   aspirin  EC 325 MG tablet Take 1 tablet (325 mg total) by mouth daily. 14 tablet 0   ibuprofen  (ADVIL ) 800 MG tablet Take 1 tablet (800 mg total) by mouth every 8 (eight) hours for 10 days. Please take with food, please alternate with acetaminophen  30 tablet 0   oxyCODONE  (ROXICODONE ) 5 MG immediate release tablet Take 1 tablet (5 mg total) by mouth every 4 (four) hours as needed for severe pain (pain score 7-10) or breakthrough pain. 5 tablet 0   albuterol  (VENTOLIN  HFA) 108 (90 Base) MCG/ACT inhaler Inhale 2 inhalations into the lungs every 4 (four) hours as needed for Wheezing 18 g 1   cholecalciferol (VITAMIN D) 1000 UNITS tablet Take 1,000 Units by mouth daily.       EPINEPHrine  0.3 mg/0.3 mL IJ SOAJ injection Inject 0.3 mLs (0.3 mg total) into the muscle once as needed for up to 1 dose. 2 each 1   fexofenadine (ALLEGRA) 180 MG tablet Allegra Allergy 180 mg tablet  Take 1 tablet every day by oral route.       fluticasone  (FLONASE ) 50 MCG/ACT nasal spray Spray 2 sprays into both nostrils once daily as needed. 48 g 1   imipramine  (TOFRANIL ) 25 MG tablet Take 2 tablets (50 mg total) by mouth at bedtime. 200 tablet 1   levothyroxine  (SYNTHROID ) 100 MCG tablet Take 1 tablet (100 mcg total) by mouth daily. 90 tablet 1   lovastatin  (MEVACOR ) 20 MG tablet Take 1 tablet (20 mg total) by mouth daily with supper. 100 tablet 1   metoprolol  succinate (TOPROL -XL) 50 MG 24 hr tablet Take 1 tablet (50 mg total) by mouth daily. 90 tablet 3   montelukast  (SINGULAIR ) 10 MG tablet Take 1 tablet (10 mg total) by mouth at bedtime 90 tablet 1   Multiple Vitamins-Minerals (MULTIVITAMIN ADULT) CHEW Chew 1 tablet by mouth daily.       nitrofurantoin  (MACRODANTIN ) 50 MG capsule Take 1 capsule (50 mg total) by mouth daily. 90 capsule 1      No current  facility-administered medications for this visit.       Imaging Results (Last 48 hours)  CT CARDIAC SCORING (SELF PAY ONLY) Result Date: 12/30/2023 CLINICAL DATA:  Risk stratification EXAM: Coronary Calcium Score TECHNIQUE: The patient was scanned on a Siemens Somatom scanner. Axial non-contrast 3 mm slices were carried out through the heart. The data set was analyzed on a dedicated work station and scored using the Agatson method. FINDINGS: Non-cardiac: See separate report from [facility]. Ascending Aorta: Normal size Pericardium: Normal Coronary arteries: Normal origin of left and right coronary arteries. Distribution of arterial calcifications if present, as noted below; LM 0 LAD 123 LCx 0 RCA 0 Total 123 IMPRESSION AND RECOMMENDATION: 1. Coronary calcium score of 123. This was 90th percentile for age and sex matched control. 2. CAC 100-299 in LAD. CAC-DRS A2/N1. 3. Recommend aspirin  and statin if no contraindication. 4. Continue heart healthy lifestyle and risk factor modification. Electronically Signed   By: Constancia Delton M.D.   On: 12/30/2023 08:20  Review of Systems:   A ROS was performed including pertinent positives and negatives as documented in the HPI.   Physical Exam :   Constitutional: NAD and appears stated age Neurological: Alert and oriented Psych: Appropriate affect and cooperative Last menstrual period 07/01/2014.    Comprehensive Musculoskeletal Exam:     Right elbow with clicking and popping about the posterior radiocapitellar joint particularly if she goes into hyperextension.  There is tenderness about the lateral epicondyle which does reproduce pain down the extensor wad with resisted extension.  She otherwise has full painless range of motion about the hand.  Range of motion about the elbow is 0 to 140 degrees without any varus or valgus instability     Imaging:   Xray (4 views right elbow): Normal   MRI (right elbow): Significant plical  thickening involving the radial capitellar joint     I personally reviewed and interpreted the radiographs.     Assessment and Plan:   63 y.o. female with a synovial plica of the right elbow which has become symptomatic and popping.  I did describe as well that she does have evidence of lateral epicondylitis.  Both of these were now failed injection as well as occupational therapy.  Given this I did discuss the possibility of elbow arthroscopy with plica excision as well as a tennis elbow debridement.  I did discuss the risks and benefits as well as limitations associated with this.  After discussion she has elected to proceed   -Plan for right elbow arthroscopy with plica excision and tennis elbow release     After a lengthy discussion of treatment options, including risks, benefits, alternatives, complications of surgical and nonsurgical conservative options, the patient elected surgical repair.    The patient  is aware of the material risks  and complications including, but not limited to injury to adjacent structures, neurovascular injury, infection, numbness, bleeding, implant failure, thermal burns, stiffness, persistent pain, failure to heal, disease transmission from allograft, need for further surgery, dislocation, anesthetic risks, blood clots, risks of death,and others. The probabilities of surgical success and failure discussed with patient given their particular co-morbidities.The time and nature of expected rehabilitation and recovery was discussed.The patient's questions were all answered preoperatively.  No barriers to understanding were noted. I explained the natural history of the disease process and Rx rationale.  I explained to the patient what I considered to be reasonable expectations given their personal situation.  The final treatment plan was arrived at through a shared patient decision making process model.     I personally saw and evaluated the patient, and participated  in the management and treatment plan.   Wilhelmenia Harada, MD Attending Physician, Orthopedic Surgery   This document was dictated using Dragon voice recognition software. A reasonable attempt at proof reading has been made to minimize errors.

## 2024-02-06 NOTE — Anesthesia Procedure Notes (Signed)
 Procedure Name: Intubation Date/Time: 02/06/2024 12:20 PM  Performed by: Lonia Ro, CRNAPre-anesthesia Checklist: Patient identified, Emergency Drugs available, Suction available, Patient being monitored and Timeout performed Patient Re-evaluated:Patient Re-evaluated prior to induction Oxygen Delivery Method: Circle system utilized Preoxygenation: Pre-oxygenation with 100% oxygen Induction Type: IV induction Ventilation: Mask ventilation without difficulty Laryngoscope Size: Mac and 3 Grade View: Grade II Tube type: Oral Tube size: 7.0 mm Number of attempts: 1 Airway Equipment and Method: Stylet Placement Confirmation: ETT inserted through vocal cords under direct vision, positive ETCO2 and breath sounds checked- equal and bilateral Secured at: 20 cm Tube secured with: Tape Dental Injury: Teeth and Oropharynx as per pre-operative assessment  Comments: Cricoid pressure required to view cords

## 2024-02-06 NOTE — Anesthesia Preprocedure Evaluation (Addendum)
 Anesthesia Evaluation  Patient identified by MRN, date of birth, ID band Patient awake    Reviewed: Allergy & Precautions, H&P , NPO status , Patient's Chart, lab work & pertinent test results, reviewed documented beta blocker date and time   History of Anesthesia Complications (+) PONV and history of anesthetic complications  Airway Mallampati: II  TM Distance: >3 FB Neck ROM: Full    Dental  (+) Teeth Intact, Dental Advisory Given   Pulmonary asthma (albuterol  rarely uses)    Pulmonary exam normal breath sounds clear to auscultation       Cardiovascular hypertension (127/93 preop), Pt. on home beta blockers and Pt. on medications Normal cardiovascular exam+ dysrhythmias (metoprolol  last took today) Supra Ventricular Tachycardia  Rhythm:Regular Rate:Normal     Neuro/Psych negative neurological ROS  negative psych ROS   GI/Hepatic negative GI ROS, Neg liver ROS,,,  Endo/Other  Hypothyroidism    Renal/GU negative Renal ROS  negative genitourinary   Musculoskeletal  (+) Arthritis , Osteoarthritis,    Abdominal   Peds negative pediatric ROS (+)  Hematology negative hematology ROS (+)   Anesthesia Other Findings   Reproductive/Obstetrics negative OB ROS                             Anesthesia Physical Anesthesia Plan  ASA: 2  Anesthesia Plan: General and Regional   Post-op Pain Management: Tylenol  PO (pre-op)* and Regional block*   Induction: Intravenous  PONV Risk Score and Plan: 4 or greater and Ondansetron , Dexamethasone , Midazolam  and Treatment may vary due to age or medical condition  Airway Management Planned: Oral ETT  Additional Equipment: None  Intra-op Plan:   Post-operative Plan: Extubation in OR  Informed Consent: I have reviewed the patients History and Physical, chart, labs and discussed the procedure including the risks, benefits and alternatives for the proposed  anesthesia with the patient or authorized representative who has indicated his/her understanding and acceptance.     Dental advisory given  Plan Discussed with: CRNA  Anesthesia Plan Comments:        Anesthesia Quick Evaluation

## 2024-02-06 NOTE — Brief Op Note (Signed)
   Brief Op Note  Date of Surgery: 02/06/2024  Preoperative Diagnosis: RIGHT SYMPTOMATIC PLICA RIGHT TENNIS ELBOW  Postoperative Diagnosis: same  Procedure: Procedure(s): ARTHROSCOPY, ELBOW, WITH OPEN SURGERY IF INDICATED SYNOVECTOMY  Implants: * No implants in log *  Surgeons: Surgeon(s): Wilhelmenia Harada, MD  Anesthesia: General    Estimated Blood Loss: See anesthesia record  Complications: None  Condition to PACU: Stable  Carmina Chris, MD 02/06/2024 2:41 PM

## 2024-02-06 NOTE — Progress Notes (Signed)
Assisted Dr. Finucane with right, supraclavicular, ultrasound guided block. Side rails up, monitors on throughout procedure. See vital signs in flow sheet. Tolerated Procedure well. 

## 2024-02-07 ENCOUNTER — Encounter (HOSPITAL_BASED_OUTPATIENT_CLINIC_OR_DEPARTMENT_OTHER): Payer: Self-pay | Admitting: Orthopaedic Surgery

## 2024-02-08 ENCOUNTER — Other Ambulatory Visit: Payer: Self-pay

## 2024-02-08 MED ORDER — ALBUTEROL SULFATE HFA 108 (90 BASE) MCG/ACT IN AERS
2.0000 | INHALATION_SPRAY | RESPIRATORY_TRACT | 1 refills | Status: AC | PRN
  Filled 2024-02-08: qty 18, 16d supply, fill #0

## 2024-02-09 ENCOUNTER — Ambulatory Visit (HOSPITAL_BASED_OUTPATIENT_CLINIC_OR_DEPARTMENT_OTHER): Attending: Orthopaedic Surgery | Admitting: Physical Therapy

## 2024-02-09 ENCOUNTER — Encounter (HOSPITAL_BASED_OUTPATIENT_CLINIC_OR_DEPARTMENT_OTHER): Payer: Self-pay | Admitting: Physical Therapy

## 2024-02-09 ENCOUNTER — Other Ambulatory Visit: Payer: Self-pay

## 2024-02-09 DIAGNOSIS — M25521 Pain in right elbow: Secondary | ICD-10-CM | POA: Insufficient documentation

## 2024-02-09 DIAGNOSIS — M6281 Muscle weakness (generalized): Secondary | ICD-10-CM | POA: Insufficient documentation

## 2024-02-09 DIAGNOSIS — M67821 Other specified disorders of synovium, right elbow: Secondary | ICD-10-CM | POA: Diagnosis not present

## 2024-02-09 NOTE — Therapy (Signed)
 OUTPATIENT PHYSICAL THERAPY EVALUATION   Patient Name: [name] MRN: [id number] DOB:1960-11-15, 63 y.o., female Today's Date: 02/09/2024  END OF SESSION:  PT End of Session - 02/09/24 1301     Visit Number 1    Number of Visits 6    Date for PT Re-Evaluation 03/24/24    PT Start Time 1300    PT Stop Time 1327    PT Time Calculation (min) 27 min    Activity Tolerance Patient tolerated treatment well    Behavior During Therapy WFL for tasks assessed/performed          Past Medical History:  Diagnosis Date   Allergy    Arthritis    osteoarthritis - fingers   Asthma    Chicken pox    Chronic UTI    Cystocele    2nd degree   Endometriosis    Family history of polyps in the colon    Graves disease    History of heavy periods    Hole in the ear drum, right    Hyperlipemia    IBS (irritable bowel syndrome)    Perimenopausal    PONV (postoperative nausea and vomiting)    nausea only   Rectocele    mild   S/P endometrial ablation    Scoliosis    Skin cancer    SUI (stress urinary incontinence, female)    SVT (supraventricular tachycardia) (HCC)    Urine incontinence    Wears contact lenses    Wears hearing aid in both ears    Past Surgical History:  Procedure Laterality Date   CATARACT EXTRACTION W/PHACO Right 11/03/2021   Procedure: CATARACT EXTRACTION PHACO AND INTRAOCULAR LENS PLACEMENT (IOC) RIGHT;  Surgeon: Clair Crews, MD;  Location: [facility];  Service: Ophthalmology;  Laterality: Right;  2.98 0:27.0   CATARACT EXTRACTION W/PHACO Left 12/22/2021   Procedure: CATARACT EXTRACTION PHACO AND INTRAOCULAR LENS PLACEMENT (IOC) LEFT 2.38 00:26.0;  Surgeon: Clair Crews, MD;  Location: [facility];  Service: Ophthalmology;  Laterality: Left;   COLONOSCOPY WITH PROPOFOL  N/A 07/10/2018   Procedure: COLONOSCOPY WITH PROPOFOL ;  Surgeon: Cassie Click, MD;  Location: [facility];  Service: Endoscopy;  Laterality: N/A;    COLONOSCOPY WITH PROPOFOL  N/A 07/11/2023   Procedure: COLONOSCOPY WITH PROPOFOL ;  Surgeon: Luke Salaam, MD;  Location: [facility];  Service: Gastroenterology;  Laterality: N/A;   ELBOW ARTHROSCOPY Right 02/06/2024   Procedure: ARTHROSCOPY, ELBOW, WITH OPEN SURGERY IF INDICATED;  Surgeon: Wilhelmenia Harada, MD;  Location: [facility];  Service: Orthopedics;  Laterality: Right;  RIGHT ELBOW ARTHROSCOPY WITH PLICA EXCISION AND LATERAL EPICONDYLE RELEASE   endometrrial ablation     EYE SURGERY     knee athroscopy Left 2013   laprascopic surgery     endometreosis   NASOPHARYNGOSCOPY EUSTATION TUBE BALLOON DILATION Bilateral 05/18/2018   Procedure: NASOPHARYNGOSCOPY EUSTATION TUBE BALLOON DILATION OUTFRACTURE BILATERAL INFERIOR TURBINATES;  Surgeon: Mellody Sprout, MD;  Location: [facility];  Service: ENT;  Laterality: Bilateral;   NASOPHARYNGOSCOPY EUSTATION TUBE BALLOON DILATION     nova-sure     POLYPECTOMY  07/11/2023   Procedure: POLYPECTOMY;  Surgeon: Luke Salaam, MD;  Location: [facility];  Service: Gastroenterology;;   Deane Ewing Right 02/06/2024   Procedure: Deane Ewing;  Surgeon: Wilhelmenia Harada, MD;  Location: [facility];  Service: Orthopedics;  Laterality: Right;   THYROIDECTOMY, PARTIAL     TONSILLECTOMY AND ADENOIDECTOMY     tubes in ears     Patient Active  Problem List   Diagnosis Date Noted   Plica syndrome of right elbow 02/06/2024   Lateral epicondylitis of right elbow 02/06/2024   Adenomatous polyp of colon 07/11/2023   Family history of colon cancer in father 07/11/2023   Allergy to environmental factors 08/22/2019   Disorder of urinary tract 08/22/2019   Hypertensive disorder 08/22/2019   Irregular heart beat 08/22/2019   Osteoarthritis 07/24/2019   Generalized osteoarthritis of hand 07/18/2019   Encounter for orthopedic follow-up care 10/10/2018   Digital mucous cyst 07/12/2018   History of Graves' disease 06/14/2018   Pure  hypercholesterolemia 06/14/2018   Rhinitis 06/14/2018   Rosacea 06/14/2018   Personal history of colon polyps, unspecified 05/09/2018   Midline cystocele 04/19/2018   Chest pain with low risk for cardiac etiology 10/26/2017   Borderline diabetes mellitus 11/05/2015   Chronic UTI 03/21/2015   SVT (supraventricular tachycardia) (HCC) 03/21/2015   Endometriosis 03/21/2015   Asthma 03/21/2015   Graves disease 03/21/2015   Irritable bowel syndrome 03/21/2015   Family history of breast cancer 03/21/2015   Stool incontinence 03/21/2015   Gastrocnemius tear 01/18/2014   Rupture of patellar tendon 01/18/2014   Bladder infection, chronic 05/24/2012   Incomplete emptying of bladder 05/24/2012   Mixed urge and stress incontinence 05/24/2012     REFERRING PROVIDER: Carmina Chris MD   REFERRING DIAG:  [phone] (ICD-10-CM) - Plica syndrome of right elbow     PROCEDURE: 1. Right elbow arthroscopy with extensor carpi radialis brevis release   Rationale for Evaluation and Treatment: Rehabilitation  THERAPY DIAG:  Pain in right elbow  Muscle weakness (generalized)  ONSET DATE: DOS 02/06/24   SUBJECTIVE:                                                                                                                                                                                           SUBJECTIVE STATEMENT: Not really sure what happened. I noticed popping/clicking with any rotation. Plica removed and released tennis elbow. 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   PERTINENT HISTORY:  N/a  PAIN:  Are you having pain? Yes: NPRS scale: 2/10 Pain location: Rt elbow Pain description: achey Aggravating factors: end range ext Relieving factors: rest  PRECAUTIONS:  None  RED FLAGS: None   WEIGHT BEARING RESTRICTIONS:  She will be activity as tolerated once her nerve block wears off.  She will begin elbow range of motion  FALLS:  Has patient fallen in last 6 months?  No   OCCUPATION:  Occupational nurse- blood pressure, first aid, bandages, eventual return to CPR teaching  PLOF:  Independent  PATIENT GOALS:  Return to work (2wk per MD),   OBJECTIVE:  Note: Objective measures were completed at Evaluation unless otherwise noted.  PATIENT SURVEYS:  UEFS  Extreme difficulty/unable (0), Quite a bit of difficulty (1), Moderate difficulty (2), Little difficulty (3), No difficulty (4) Survey date:  6/19 EVAL  Any of your usual work, household or school activities 1   2. Your usual hobbies, recreational/sport activities 0   3. Lifting a bag of groceries to waist level 0   4. Lifting a bag of groceries above your head 0  5. Grooming your hair 1  6. Pushing up on your hands (I.e. from bathtub or chair) 0  7. Preparing food (I.e. peeling/cutting) 0  8. Driving  0  9. Vacuuming, sweeping, or raking 0  10. Dressing  2  11. Doing up buttons 0  12. Using tools/appliances 1  13. Opening doors 1  14. Cleaning  0  15. Tying or lacing shoes 0  16. Sleeping  3  17. Laundering clothes (I.e. washing, ironing, folding) 0  18. Opening a jar 0  19. Throwing a ball 0  20. Carrying a Scientist, water quality with your affected limb.  0  Score total:  9     COGNITIVE STATUS: Within functional limits for tasks assessed   SENSATION: WFL  EDEMA:  EVAL: minimal edema in elbow and hand as expected post op  POSTURE:  Eval: tends for forward rounded rt shoulder since she was in a sling  HAND DOMINANCE:  Right     Body Part #1 Rt Elbow  PALPATION: Eval: Full ROM stretchy end feel  Not appropriate to test MMT due to recent surgery, full PROM with some discomfort at end range with AROM in elbow ext and wrist extension                                                                                                                              TREATMENT DATE:   Eval: see HEP Changed bandages & discussed ice   PATIENT EDUCATION:  Education details: Anatomy  of condition, POC, HEP, exercise form/rationale Person educated: Patient Education method: Explanation, Demonstration, Tactile cues, Verbal cues, and Handouts Education comprehension: verbalized understanding, returned demonstration, verbal cues required, tactile cues required, and needs further education  HOME EXERCISE PROGRAM: Access Code: [email or web address] URL: [email or web address] Date: 02/09/2024 Prepared by: Keven Pel  Program Notes keep the shoulder moving  Exercises - Seated Scapular Retraction  - Standing Upper Trapezius Stretch  - 2-3 x daily - 7 x weekly - 2 sets - 3 breaths hold - Gentle Levator Scapulae Stretch  - 2-3 x daily - 7 x weekly - 2 sets - 3 breaths hold - Seated Elbow Flexion AAROM  - 3-5 x daily - 7 x weekly - 1 sets - 10 reps - Seated Forearm Pronation and Supination AROM  - 3-5 x daily - 7 x weekly - 1 sets - 10 reps   ASSESSMENT:  CLINICAL  IMPRESSION: Patient is a 63 y.o. F who was seen today for physical therapy evaluation and treatment for treatment s/p Rt elbow arthroscopy with extensor carpi radialis brevis release. Overall doing very well and encouraged her to respect time necessary for healing since she is use as tolerated.      REHAB POTENTIAL: Good  CLINICAL DECISION MAKING: Stable/uncomplicated  EVALUATION COMPLEXITY: Low   GOALS: Goals reviewed with patient? Yes  SHORT TERM GOALS: Target date: 7/5  Full extension AROM without pain in elbow Baseline: Goal status: INITIAL    LONG TERM GOALS: Target date: POC date  Grip strength 85% of opposite hand Baseline: not appropriate to test at eval Goal status: INITIAL  2.  UEFI to reach ceiling score Baseline:  Goal status: INITIAL  3.  Independent in long term HEP for full UE Baseline:  Goal status: INITIAL  4.  Complete work activities pain <2/10 Baseline:  Goal status: INITIAL     PLAN:  PT FREQUENCY: 1-2x/week  PT DURATION: POC date  PLANNED  INTERVENTIONS: 97164- PT Re-evaluation, 97750- Physical Performance Testing, 97110-Therapeutic exercises, 97530- Therapeutic activity, [id number]- Neuromuscular re-education, 97535- Self Care, 16109- Manual therapy, G0283- Electrical stimulation (unattended), 20560 (1-2 muscles), 20561 (3+ muscles)- Dry Needling, Patient/Family education, Taping, Joint mobilization, Spinal mobilization, Scar mobilization, Cryotherapy, and Moist heat.  PLAN FOR NEXT SESSION: activity as tolerated   Keven Pel, PT, DPT 02/09/2024, 3:57 PM

## 2024-02-14 ENCOUNTER — Encounter: Payer: Self-pay | Admitting: Physical Therapy

## 2024-02-14 ENCOUNTER — Ambulatory Visit: Attending: Orthopaedic Surgery | Admitting: Physical Therapy

## 2024-02-14 DIAGNOSIS — M6281 Muscle weakness (generalized): Secondary | ICD-10-CM | POA: Diagnosis not present

## 2024-02-14 DIAGNOSIS — M7711 Lateral epicondylitis, right elbow: Secondary | ICD-10-CM | POA: Insufficient documentation

## 2024-02-14 DIAGNOSIS — M25521 Pain in right elbow: Secondary | ICD-10-CM | POA: Insufficient documentation

## 2024-02-14 NOTE — Therapy (Signed)
 OUTPATIENT PHYSICAL THERAPY TREATMENT  Patient Name: [name] MRN: [id number] DOB:[birth year], 63 y.o., female Today's Date: 02/14/2024  END OF SESSION:  PT End of Session - 02/14/24 1247     Visit Number 2    Number of Visits 6    Date for PT Re-Evaluation 03/24/24    PT Start Time 1255    PT Stop Time 1344    PT Time Calculation (min) 49 min    Activity Tolerance Patient tolerated treatment well    Behavior During Therapy WFL for tasks assessed/performed         Past Medical History:  Diagnosis Date   Allergy    Arthritis    osteoarthritis - fingers   Asthma    Chicken pox    Chronic UTI    Cystocele    2nd degree   Endometriosis    Family history of polyps in the colon    Graves disease    History of heavy periods    Hole in the ear drum, right    Hyperlipemia    IBS (irritable bowel syndrome)    Perimenopausal    PONV (postoperative nausea and vomiting)    nausea only   Rectocele    mild   S/P endometrial ablation    Scoliosis    Skin cancer    SUI (stress urinary incontinence, female)    SVT (supraventricular tachycardia) (HCC)    Urine incontinence    Wears contact lenses    Wears hearing aid in both ears    Past Surgical History:  Procedure Laterality Date   CATARACT EXTRACTION W/PHACO Right 11/03/2021   Procedure: CATARACT EXTRACTION PHACO AND INTRAOCULAR LENS PLACEMENT (IOC) RIGHT;  Surgeon: Jaye Fallow, MD;  Location: [facility];  Service: Ophthalmology;  Laterality: Right;  2.98 0:27.0   CATARACT EXTRACTION W/PHACO Left 12/22/2021   Procedure: CATARACT EXTRACTION PHACO AND INTRAOCULAR LENS PLACEMENT (IOC) LEFT 2.38 00:26.0;  Surgeon: Jaye Fallow, MD;  Location: [facility];  Service: Ophthalmology;  Laterality: Left;   COLONOSCOPY WITH PROPOFOL  N/A 07/10/2018   Procedure: COLONOSCOPY WITH PROPOFOL ;  Surgeon: Viktoria Lamar DASEN, MD;  Location: [facility];  Service: Endoscopy;  Laterality: N/A;   COLONOSCOPY  WITH PROPOFOL  N/A 07/11/2023   Procedure: COLONOSCOPY WITH PROPOFOL ;  Surgeon: Therisa Bi, MD;  Location: [facility];  Service: Gastroenterology;  Laterality: N/A;   ELBOW ARTHROSCOPY Right 02/06/2024   Procedure: ARTHROSCOPY, ELBOW, WITH OPEN SURGERY IF INDICATED;  Surgeon: Genelle Standing, MD;  Location: [facility];  Service: Orthopedics;  Laterality: Right;  RIGHT ELBOW ARTHROSCOPY WITH PLICA EXCISION AND LATERAL EPICONDYLE RELEASE   endometrrial ablation     EYE SURGERY     knee athroscopy Left 2013   laprascopic surgery     endometreosis   NASOPHARYNGOSCOPY EUSTATION TUBE BALLOON DILATION Bilateral 05/18/2018   Procedure: NASOPHARYNGOSCOPY EUSTATION TUBE BALLOON DILATION OUTFRACTURE BILATERAL INFERIOR TURBINATES;  Surgeon: Edda Mt, MD;  Location: [facility];  Service: ENT;  Laterality: Bilateral;   NASOPHARYNGOSCOPY EUSTATION TUBE BALLOON DILATION     nova-sure     POLYPECTOMY  07/11/2023   Procedure: POLYPECTOMY;  Surgeon: Therisa Bi, MD;  Location: [facility] END[facility];  Service: Gastroenterology;;   MERRILEE Right 02/06/2024   Procedure: MERRILEE;  Surgeon: Genelle Standing, MD;  Location: [facility];  Service: Orthopedics;  Laterality: Right;   THYROIDECTOMY, PARTIAL     TONSILLECTOMY AND ADENOIDECTOMY     tubes in ears     Patient Active Problem List  Diagnosis Date Noted   Plica syndrome of right elbow 02/06/2024   Lateral epicondylitis of right elbow 02/06/2024   Adenomatous polyp of colon 07/11/2023   Family history of colon cancer in father 07/11/2023   Allergy to environmental factors 08/22/2019   Disorder of urinary tract 08/22/2019   Hypertensive disorder 08/22/2019   Irregular heart beat 08/22/2019   Osteoarthritis 07/24/2019   Generalized osteoarthritis of hand 07/18/2019   Encounter for orthopedic follow-up care 10/10/2018   Digital mucous cyst 07/12/2018   History of Graves' disease 06/14/2018   Pure  hypercholesterolemia 06/14/2018   Rhinitis 06/14/2018   Rosacea 06/14/2018   Personal history of colon polyps, unspecified 05/09/2018   Midline cystocele 04/19/2018   Chest pain with low risk for cardiac etiology 10/26/2017   Borderline diabetes mellitus 11/05/2015   Chronic UTI 03/21/2015   SVT (supraventricular tachycardia) (HCC) 03/21/2015   Endometriosis 03/21/2015   Asthma 03/21/2015   Graves disease 03/21/2015   Irritable bowel syndrome 03/21/2015   Family history of breast cancer 03/21/2015   Stool incontinence 03/21/2015   Gastrocnemius tear 01/18/2014   Rupture of patellar tendon 01/18/2014   Bladder infection, chronic 05/24/2012   Incomplete emptying of bladder 05/24/2012   Mixed urge and stress incontinence 05/24/2012   REFERRING PROVIDER: Elspeth LITTIE Parker MD   REFERRING DIAG:  [id number] (ICD-10-CM) - Plica syndrome of right elbow     PROCEDURE: 1. Right elbow arthroscopy with extensor carpi radialis brevis release  Rationale for Evaluation and Treatment: Rehabilitation  THERAPY DIAG:  Lateral epicondylitis of right elbow  Pain in right elbow  Muscle weakness (generalized)  ONSET DATE: DOS 02/06/24  SUBJECTIVE:                                                                                                                                                                                           SUBJECTIVE STATEMENT: Not really sure what happened. I noticed popping/clicking with any rotation. Plica removed and released tennis elbow. 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   PERTINENT HISTORY:  N/a  PAIN:  Are you having pain? Yes: NPRS scale: 2/10 Pain location: Rt elbow Pain description: achey Aggravating factors: end range ext Relieving factors: rest  PRECAUTIONS:  None  RED FLAGS: None   WEIGHT BEARING RESTRICTIONS:  She will be activity as tolerated once her nerve block wears off.  She will begin elbow range of motion  FALLS:  Has patient  fallen in last 6 months? No   OCCUPATION:  Occupational nurse- blood pressure, first aid, bandages, eventual return to CPR teaching  PLOF:  Independent  PATIENT GOALS:  Return to  work (2wk per MD),   OBJECTIVE:  Note: Objective measures were completed at Evaluation unless otherwise noted.  PATIENT SURVEYS:  UEFS  Extreme difficulty/unable (0), Quite a bit of difficulty (1), Moderate difficulty (2), Little difficulty (3), No difficulty (4) Survey date:  6/19 EVAL  Any of your usual work, household or school activities 1   2. Your usual hobbies, recreational/sport activities 0   3. Lifting a bag of groceries to waist level 0   4. Lifting a bag of groceries above your head 0  5. Grooming your hair 1  6. Pushing up on your hands (I.e. from bathtub or chair) 0  7. Preparing food (I.e. peeling/cutting) 0  8. Driving  0  9. Vacuuming, sweeping, or raking 0  10. Dressing  2  11. Doing up buttons 0  12. Using tools/appliances 1  13. Opening doors 1  14. Cleaning  0  15. Tying or lacing shoes 0  16. Sleeping  3  17. Laundering clothes (I.e. washing, ironing, folding) 0  18. Opening a jar 0  19. Throwing a ball 0  20. Carrying a Scientist, water quality with your affected limb.  0  Score total:  9     COGNITIVE STATUS: Within functional limits for tasks assessed   SENSATION: WFL  EDEMA:  EVAL: minimal edema in elbow and hand as expected post op  POSTURE:  Eval: tends for forward rounded rt shoulder since she was in a sling  HAND DOMINANCE:  Right   Body Part #1 Rt Elbow  PALPATION: Eval: Full ROM stretchy end feel  Not appropriate to test MMT due to recent surgery, full PROM with some discomfort at end range with AROM in elbow ext and wrist extension                                                                                                                           TREATMENT DATE: 02/14/2024  Subjective:  Pt. reports no R elbow pain today and is 8 days s/p surgery.   Pt. Returns to Dr. Genelle on Friday.  Pt. Reports good compliance with HEP and daily icing (2-3/day).  R elbow bandage remains in place until MD visit this week.    There.ex.:    Reviewed HEP/ seated R elbow and wrist flexion/ extension AROM (all planes)- WFL all planes of movement.  Good stretch with R elbow extension with light OP.    Manual tx.:  Seated with R elbow/forearm supported on pillow at edge blue mat table:  R elbow stretches with static holds (all planes).    STM to R distal biceps/ triceps and forearm.    Ice to R elbow after tx. In seated supported position.     PATIENT EDUCATION:  Education details: Teacher, music of condition, POC, HEP, exercise form/rationale Person educated: Patient Education method: Explanation, Demonstration, Tactile cues, Verbal cues, and Handouts Education comprehension: verbalized understanding, returned demonstration, verbal cues required, tactile cues required, and needs further education  HOME EXERCISE PROGRAM: Access Code: [email or web address] URL: [email or web address] Date: 02/09/2024 Prepared by: Harlene Cordon  Program Notes keep the shoulder moving  Exercises - Seated Scapular Retraction  - Standing Upper Trapezius Stretch  - 2-3 x daily - 7 x weekly - 2 sets - 3 breaths hold - Gentle Levator Scapulae Stretch  - 2-3 x daily - 7 x weekly - 2 sets - 3 breaths hold - Seated Elbow Flexion AAROM  - 3-5 x daily - 7 x weekly - 1 sets - 10 reps - Seated Forearm Pronation and Supination AROM  - 3-5 x daily - 7 x weekly - 1 sets - 10 reps   ASSESSMENT:  CLINICAL IMPRESSION: Pt. s/p Rt elbow arthroscopy with extensor carpi radialis brevis release 8 days ago.  Pt. Reports no elbow pain currently and unable to assess incision due to bandage in place.  Good R elbow/wrist ROM all planes with no increase c/o pain.  Overall doing very well and encouraged her to respect time necessary for healing.    REHAB POTENTIAL: Good  CLINICAL DECISION  MAKING: Stable/uncomplicated  EVALUATION COMPLEXITY: Low   GOALS: Goals reviewed with patient? Yes  SHORT TERM GOALS: Target date: 7/5  Full extension AROM without pain in elbow Baseline: Goal status: INITIAL    LONG TERM GOALS: Target date: POC date  Grip strength 85% of opposite hand Baseline: not appropriate to test at eval Goal status: INITIAL  2.  UEFI to reach ceiling score Baseline:  Goal status: INITIAL  3.  Independent in long term HEP for full UE Baseline:  Goal status: INITIAL  4.  Complete work activities pain <2/10 Baseline:  Goal status: INITIAL     PLAN:  PT FREQUENCY: 1-2x/week  PT DURATION: POC date  PLANNED INTERVENTIONS: 97164- PT Re-evaluation, 97750- Physical Performance Testing, 97110-Therapeutic exercises, 97530- Therapeutic activity, [id number]- Neuromuscular re-education, 97535- Self Care, 02859- Manual therapy, G0283- Electrical stimulation (unattended), 20560 (1-2 muscles), 20561 (3+ muscles)- Dry Needling, Patient/Family education, Taping, Joint mobilization, Spinal mobilization, Scar mobilization, Cryotherapy, and Moist heat.  PLAN FOR NEXT SESSION: Discuss MD f/u.    Ozell JAYSON Sero, PT, DPT # [id number] 02/14/2024, 2:08 PM

## 2024-02-16 ENCOUNTER — Encounter (HOSPITAL_BASED_OUTPATIENT_CLINIC_OR_DEPARTMENT_OTHER): Admitting: Physical Therapy

## 2024-02-17 ENCOUNTER — Ambulatory Visit (HOSPITAL_BASED_OUTPATIENT_CLINIC_OR_DEPARTMENT_OTHER): Admitting: Orthopaedic Surgery

## 2024-02-17 DIAGNOSIS — M67821 Other specified disorders of synovium, right elbow: Secondary | ICD-10-CM

## 2024-02-17 NOTE — Progress Notes (Signed)
 Post Operative Evaluation    Procedure/Date of Surgery: Right shoulder arthroscopic lateral epicondylar release with plica excision 6/16  Interval History:   Presents 2 weeks status post the above procedure.  Overall she is doing very well.  Has little to no pain in the elbow with some occasional stiffness clicking particular in the morning   PMH/PSH/Family History/Social History/Meds/Allergies:    Past Medical History:  Diagnosis Date   Allergy    Arthritis    osteoarthritis - fingers   Asthma    Chicken pox    Chronic UTI    Cystocele    2nd degree   Endometriosis    Family history of polyps in the colon    Graves disease    History of heavy periods    Hole in the ear drum, right    Hyperlipemia    IBS (irritable bowel syndrome)    Perimenopausal    PONV (postoperative nausea and vomiting)    nausea only   Rectocele    mild   S/P endometrial ablation    Scoliosis    Skin cancer    SUI (stress urinary incontinence, female)    SVT (supraventricular tachycardia) (HCC)    Urine incontinence    Wears contact lenses    Wears hearing aid in both ears    Past Surgical History:  Procedure Laterality Date   CATARACT EXTRACTION W/PHACO Right 11/03/2021   Procedure: CATARACT EXTRACTION PHACO AND INTRAOCULAR LENS PLACEMENT (IOC) RIGHT;  Surgeon: Jaye Fallow, MD;  Location: [facility];  Service: Ophthalmology;  Laterality: Right;  2.98 0:27.0   CATARACT EXTRACTION W/PHACO Left 12/22/2021   Procedure: CATARACT EXTRACTION PHACO AND INTRAOCULAR LENS PLACEMENT (IOC) LEFT 2.38 00:26.0;  Surgeon: Jaye Fallow, MD;  Location: [facility];  Service: Ophthalmology;  Laterality: Left;   COLONOSCOPY WITH PROPOFOL  N/A 07/10/2018   Procedure: COLONOSCOPY WITH PROPOFOL ;  Surgeon: Viktoria Lamar DASEN, MD;  Location: [facility];  Service: Endoscopy;  Laterality: N/A;   COLONOSCOPY WITH PROPOFOL  N/A 07/11/2023   Procedure:  COLONOSCOPY WITH PROPOFOL ;  Surgeon: Therisa Bi, MD;  Location: [facility];  Service: Gastroenterology;  Laterality: N/A;   ELBOW ARTHROSCOPY Right 02/06/2024   Procedure: ARTHROSCOPY, ELBOW, WITH OPEN SURGERY IF INDICATED;  Surgeon: Genelle Standing, MD;  Location: [facility];  Service: Orthopedics;  Laterality: Right;  RIGHT ELBOW ARTHROSCOPY WITH PLICA EXCISION AND LATERAL EPICONDYLE RELEASE   endometrrial ablation     EYE SURGERY     knee athroscopy Left 2013   laprascopic surgery     endometreosis   NASOPHARYNGOSCOPY EUSTATION TUBE BALLOON DILATION Bilateral 05/18/2018   Procedure: NASOPHARYNGOSCOPY EUSTATION TUBE BALLOON DILATION OUTFRACTURE BILATERAL INFERIOR TURBINATES;  Surgeon: Edda Mt, MD;  Location: [facility];  Service: ENT;  Laterality: Bilateral;   NASOPHARYNGOSCOPY EUSTATION TUBE BALLOON DILATION     nova-sure     POLYPECTOMY  07/11/2023   Procedure: POLYPECTOMY;  Surgeon: Therisa Bi, MD;  Location: [facility];  Service: Gastroenterology;;   MERRILEE Right 02/06/2024   Procedure: MERRILEE;  Surgeon: Genelle Standing, MD;  Location:  SURGERY CENTER;  Service: Orthopedics;  Laterality: Right;   THYROIDECTOMY, PARTIAL     TONSILLECTOMY AND ADENOIDECTOMY     tubes in ears     Social History   Socioeconomic History   Marital status: Married  Spouse name: Not on file   Number of children: Not on file   Years of education: Not on file   Highest education level: Not on file  Occupational History   Occupation: [employer]  Tobacco Use   Smoking status: Never    Passive exposure: Never   Smokeless tobacco: Never  Vaping Use   Vaping status: Never Used  Substance and Sexual Activity   Alcohol use: No    Alcohol/week: 0.0 standard drinks of alcohol   Drug use: No   Sexual activity: Yes    Birth control/protection: Post-menopausal  Other Topics Concern   Not on file  Social History Narrative   Patient is a Engineer, civil (consulting)  :)   Social Drivers of Health   Financial Resource Strain: Low Risk  (12/17/2023)   Received from [facility]   Overall Financial Resource Strain (CARDIA)    Difficulty of Paying Living Expenses: Not hard at all  Food Insecurity: No Food Insecurity (12/17/2023)   Received from [facility]   Hunger Vital Sign    Within the past 12 months, you worried that your food would run out before you got the money to buy more.: Never true    Within the past 12 months, the food you bought just didn't last and you didn't have money to get more.: Never true  Transportation Needs: No Transportation Needs (12/17/2023)   Received from [facility] - [facility]    In the past 12 months, has lack of transportation kept you from medical appointments or from getting medications?: No    Lack of Transportation (Non-Medical): No  Physical Activity: Sufficiently Active (04/19/2018)   Exercise Vital Sign    Days of Exercise per Week: 4 days    Minutes of Exercise per Session: 40 min  Stress: Not on file  Social Connections: Not on file   Family History  Problem Relation Age of Onset   Cancer Mother    Hyperlipidemia Mother    Heart disease Mother    Hypertension Mother    Breast cancer Mother 72   Kidney failure Mother    Stroke Mother    Cancer Father        colon   Hyperlipidemia Father    Heart disease Father    Hypertension Father    Breast cancer Maternal Aunt 84   Breast cancer Paternal Aunt 7   Kidney cancer Neg Hx    Bladder Cancer Neg Hx    Prostate cancer Neg Hx    Ovarian cancer Neg Hx    Diabetes Neg Hx    Allergies  Allergen Reactions   Ciprofloxacin Anaphylaxis   Floxin [Ofloxacin] Anaphylaxis   Levofloxacin Anaphylaxis   Quinolones Anaphylaxis   Amoxicillin     fever   Bextra [Valdecoxib]    Gyne-Lotrimin [Clotrimazole]     redness   Penicillins Rash   Septra [Sulfamethoxazole-Trimethoprim ] Rash    Flushed  and fever.   Sulfa Antibiotics Rash    fever   Current Outpatient Medications  Medication Sig Dispense Refill   albuterol  (VENTOLIN  HFA) 108 (90 Base) MCG/ACT inhaler Inhale 2 puffs into the lungs every 4 (four) hours as needed for wheezing. 18 g 1   aspirin  EC 325 MG tablet Take 1 tablet (325 mg total) by mouth daily. 14 tablet 0   cholecalciferol (VITAMIN D) 1000 UNITS tablet Take 1,000 Units by mouth daily.     EPINEPHrine  0.3 mg/0.3 mL IJ SOAJ  injection Inject 0.3 mLs (0.3 mg total) into the muscle once as needed for up to 1 dose. 2 each 1   fexofenadine (ALLEGRA) 180 MG tablet Allegra Allergy 180 mg tablet  Take 1 tablet every day by oral route.     fluticasone  (FLONASE ) 50 MCG/ACT nasal spray Spray 2 sprays into both nostrils once daily as needed. 48 g 1   imipramine  (TOFRANIL ) 25 MG tablet Take 2 tablets (50 mg total) by mouth at bedtime. 200 tablet 1   levothyroxine  (SYNTHROID ) 100 MCG tablet Take 1 tablet (100 mcg total) by mouth daily. 90 tablet 1   lovastatin  (MEVACOR ) 20 MG tablet Take 1 tablet (20 mg total) by mouth daily with supper. 100 tablet 1   metoprolol  succinate (TOPROL -XL) 50 MG 24 hr tablet Take 1 tablet (50 mg total) by mouth daily. 90 tablet 3   montelukast  (SINGULAIR ) 10 MG tablet Take 1 tablet (10 mg total) by mouth at bedtime 90 tablet 1   Multiple Vitamins-Minerals (MULTIVITAMIN ADULT) CHEW Chew 1 tablet by mouth daily.     nitrofurantoin  (MACRODANTIN ) 50 MG capsule Take 1 capsule (50 mg total) by mouth daily. 90 capsule 1   oxyCODONE  (ROXICODONE ) 5 MG immediate release tablet Take 1 tablet (5 mg total) by mouth every 4 (four) hours as needed for severe pain (pain score 7-10) or breakthrough pain. 5 tablet 0   No current facility-administered medications for this visit.   No results found.  Review of Systems:   A ROS was performed including pertinent positives and negatives as documented in the HPI.   Musculoskeletal Exam:     Right elbow incisions are  well-appearing without erythema or drainage.  Distal neurosensory exam is intact.  Full range of motion about the elbow with full pro supination and no pain about the lateral epicondyle  Imaging:      I personally reviewed and interpreted the radiographs.   Assessment:   2 weeks status post right arthroscopic tennis elbow release overall doing extremely well.  At this time she will normalize with all activities plan for full return to work.  I will plan to see her back in 4 weeks for reassessment  Plan :    - Return to clinic 4 weeks for reassessment      I personally saw and evaluated the patient, and participated in the management and treatment plan.  Elspeth Parker, MD Attending Physician, Orthopedic Surgery  This document was dictated using Dragon voice recognition software. A reasonable attempt at proof reading has been made to minimize errors.

## 2024-02-20 ENCOUNTER — Ambulatory Visit: Admitting: Physical Therapy

## 2024-02-20 ENCOUNTER — Encounter: Payer: Self-pay | Admitting: Physical Therapy

## 2024-02-20 DIAGNOSIS — M25521 Pain in right elbow: Secondary | ICD-10-CM | POA: Diagnosis not present

## 2024-02-20 DIAGNOSIS — M6281 Muscle weakness (generalized): Secondary | ICD-10-CM

## 2024-02-20 DIAGNOSIS — M7711 Lateral epicondylitis, right elbow: Secondary | ICD-10-CM

## 2024-02-20 NOTE — Therapy (Signed)
 OUTPATIENT PHYSICAL THERAPY TREATMENT  Patient Name: [name] MRN: [id number] DOB:1961-04-02, 63 y.o., female Today's Date: 02/20/2024  END OF SESSION:  PT End of Session - 02/20/24 1305     Visit Number 3    Number of Visits 6    Date for PT Re-Evaluation 03/24/24    PT Start Time 1303    PT Stop Time 1354    PT Time Calculation (min) 51 min    Activity Tolerance Patient tolerated treatment well    Behavior During Therapy WFL for tasks assessed/performed         Past Medical History:  Diagnosis Date   Allergy    Arthritis    osteoarthritis - fingers   Asthma    Chicken pox    Chronic UTI    Cystocele    2nd degree   Endometriosis    Family history of polyps in the colon    Graves disease    History of heavy periods    Hole in the ear drum, right    Hyperlipemia    IBS (irritable bowel syndrome)    Perimenopausal    PONV (postoperative nausea and vomiting)    nausea only   Rectocele    mild   S/P endometrial ablation    Scoliosis    Skin cancer    SUI (stress urinary incontinence, female)    SVT (supraventricular tachycardia) (HCC)    Urine incontinence    Wears contact lenses    Wears hearing aid in both ears    Past Surgical History:  Procedure Laterality Date   CATARACT EXTRACTION W/PHACO Right 11/03/2021   Procedure: CATARACT EXTRACTION PHACO AND INTRAOCULAR LENS PLACEMENT (IOC) RIGHT;  Surgeon: Jaye Fallow, MD;  Location: [facility];  Service: Ophthalmology;  Laterality: Right;  2.98 0:27.0   CATARACT EXTRACTION W/PHACO Left 12/22/2021   Procedure: CATARACT EXTRACTION PHACO AND INTRAOCULAR LENS PLACEMENT (IOC) LEFT 2.38 00:26.0;  Surgeon: Jaye Fallow, MD;  Location: [facility];  Service: Ophthalmology;  Laterality: Left;   COLONOSCOPY WITH PROPOFOL  N/A 07/10/2018   Procedure: COLONOSCOPY WITH PROPOFOL ;  Surgeon: Viktoria Lamar DASEN, MD;  Location: [facility];  Service: Endoscopy;  Laterality: N/A;   COLONOSCOPY  WITH PROPOFOL  N/A 07/11/2023   Procedure: COLONOSCOPY WITH PROPOFOL ;  Surgeon: Therisa Bi, MD;  Location: [facility] END[facility]COPY;  Service: Gastroenterology;  Laterality: N/A;   ELBOW ARTHROSCOPY Right 02/06/2024   Procedure: ARTHROSCOPY, ELBOW, WITH OPEN SURGERY IF INDICATED;  Surgeon: Genelle Standing, MD;  Location: [facility];  Service: Orthopedics;  Laterality: Right;  RIGHT ELBOW ARTHROSCOPY WITH PLICA EXCISION AND LATERAL EPICONDYLE RELEASE   endometrrial ablation     EYE SURGERY     knee athroscopy Left 2013   laprascopic surgery     endometreosis   NASOPHARYNGOSCOPY EUSTATION TUBE BALLOON DILATION Bilateral 05/18/2018   Procedure: NASOPHARYNGOSCOPY EUSTATION TUBE BALLOON DILATION OUTFRACTURE BILATERAL INFERIOR TURBINATES;  Surgeon: Edda Mt, MD;  Location: [facility];  Service: ENT;  Laterality: Bilateral;   NASOPHARYNGOSCOPY EUSTATION TUBE BALLOON DILATION     nova-sure     POLYPECTOMY  07/11/2023   Procedure: POLYPECTOMY;  Surgeon: Therisa Bi, MD;  Location: [facility];  Service: Gastroenterology;;   MERRILEE Right 02/06/2024   Procedure: MERRILEE;  Surgeon: Genelle Standing, MD;  Location: [facility];  Service: Orthopedics;  Laterality: Right;   THYROIDECTOMY, PARTIAL     TONSILLECTOMY AND ADENOIDECTOMY     tubes in ears     Patient Active Problem List  Diagnosis Date Noted   Plica syndrome of right elbow 02/06/2024   Lateral epicondylitis of right elbow 02/06/2024   Adenomatous polyp of colon 07/11/2023   Family history of colon cancer in father 07/11/2023   Allergy to environmental factors 08/22/2019   Disorder of urinary tract 08/22/2019   Hypertensive disorder 08/22/2019   Irregular heart beat 08/22/2019   Osteoarthritis 07/24/2019   Generalized osteoarthritis of hand 07/18/2019   Encounter for orthopedic follow-up care 10/10/2018   Digital mucous cyst 07/12/2018   History of Graves' disease 06/14/2018   Pure  hypercholesterolemia 06/14/2018   Rhinitis 06/14/2018   Rosacea 06/14/2018   Personal history of colon polyps, unspecified 05/09/2018   Midline cystocele 04/19/2018   Chest pain with low risk for cardiac etiology 10/26/2017   Borderline diabetes mellitus 11/05/2015   Chronic UTI 03/21/2015   SVT (supraventricular tachycardia) (HCC) 03/21/2015   Endometriosis 03/21/2015   Asthma 03/21/2015   Graves disease 03/21/2015   Irritable bowel syndrome 03/21/2015   Family history of breast cancer 03/21/2015   Stool incontinence 03/21/2015   Gastrocnemius tear 01/18/2014   Rupture of patellar tendon 01/18/2014   Bladder infection, chronic 05/24/2012   Incomplete emptying of bladder 05/24/2012   Mixed urge and stress incontinence 05/24/2012   REFERRING PROVIDER: Elspeth LITTIE Parker MD   REFERRING DIAG:  [phone] (ICD-10-CM) - Plica syndrome of right elbow     PROCEDURE: 1. Right elbow arthroscopy with extensor carpi radialis brevis release  Rationale for Evaluation and Treatment: Rehabilitation  THERAPY DIAG:  Lateral epicondylitis of right elbow  Pain in right elbow  Muscle weakness (generalized)  ONSET DATE: DOS 02/06/24  SUBJECTIVE:                                                                                                                                                                                           SUBJECTIVE STATEMENT: Not really sure what happened. I noticed popping/clicking with any rotation. Plica removed and released tennis elbow. 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   PERTINENT HISTORY:  N/a  PAIN:  Are you having pain? Yes: NPRS scale: 2/10 Pain location: Rt elbow Pain description: achey Aggravating factors: end range ext Relieving factors: rest  PRECAUTIONS:  None  RED FLAGS: None   WEIGHT BEARING RESTRICTIONS:  She will be activity as tolerated once her nerve block wears off.  She will begin elbow range of motion  FALLS:  Has patient  fallen in last 6 months? No  OCCUPATION:  Occupational nurse- blood pressure, first aid, bandages, eventual return to CPR teaching  PLOF:  Independent  PATIENT GOALS:  Return to work (  2wk per MD),   OBJECTIVE:  Note: Objective measures were completed at Evaluation unless otherwise noted.  PATIENT SURVEYS:  UEFS  Extreme difficulty/unable (0), Quite a bit of difficulty (1), Moderate difficulty (2), Little difficulty (3), No difficulty (4) Survey date:  6/19 EVAL  Any of your usual work, household or school activities 1   2. Your usual hobbies, recreational/sport activities 0   3. Lifting a bag of groceries to waist level 0   4. Lifting a bag of groceries above your head 0  5. Grooming your hair 1  6. Pushing up on your hands (I.e. from bathtub or chair) 0  7. Preparing food (I.e. peeling/cutting) 0  8. Driving  0  9. Vacuuming, sweeping, or raking 0  10. Dressing  2  11. Doing up buttons 0  12. Using tools/appliances 1  13. Opening doors 1  14. Cleaning  0  15. Tying or lacing shoes 0  16. Sleeping  3  17. Laundering clothes (I.e. washing, ironing, folding) 0  18. Opening a jar 0  19. Throwing a ball 0  20. Carrying a Scientist, water quality with your affected limb.  0  Score total:  9     COGNITIVE STATUS: Within functional limits for tasks assessed   SENSATION: WFL  EDEMA:  EVAL: minimal edema in elbow and hand as expected post op  POSTURE:  Eval: tends for forward rounded rt shoulder since she was in a sling  HAND DOMINANCE:  Right   Body Part #1 Rt Elbow  PALPATION: Eval: Full ROM stretchy end feel  Not appropriate to test MMT due to recent surgery, full PROM with some discomfort at end range with AROM in elbow ext and wrist extension                                                                                                                           TREATMENT DATE: 02/20/2024  Subjective:  Pt. reports no R elbow pain today and is 2 weeks s/p surgery.   Pt. Had f/u with MD last Friday and no issues.  Pt. Presents with excellent R elbow incision healing.    L/R shoulder abduction (4-/4), flexion (4/4+)- R elbow pain reported, bicep (5/4), tricep (4/5), supination (5/5), pronation (5/5)- pain in R forearm, sh. ER (5/4), sh. IR (5/5), wrist extension (5/4), flexion (5/4-)- pain in proximal forearm.  5/5 on B hand intrinsics.     L/R grip strength (#3 setting):  45.1#/ 50.1#.     There.ex.:    Reassessment of R sh./elbow/wrist AROM (no limitations).   See new HEP (issued RTB and GTB)- instructed to avoid pain provoking movement patterns.    Manual tx.:  STM to R distal biceps/ triceps and forearm.    Ice to R elbow after tx. In seated supported position.     PATIENT EDUCATION:  Education details: Teacher, music of condition, POC, HEP, exercise form/rationale Person educated: Patient Education method: Explanation, Demonstration, Tactile cues, Verbal  cues, and Handouts Education comprehension: verbalized understanding, returned demonstration, verbal cues required, tactile cues required, and needs further education  HOME EXERCISE PROGRAM: Access Code: [email or web address] URL: [email or web address] Date: 02/09/2024 Prepared by: Harlene Cordon  Program Notes keep the shoulder moving  Exercises - Seated Scapular Retraction  - Standing Upper Trapezius Stretch  - 2-3 x daily - 7 x weekly - 2 sets - 3 breaths hold - Gentle Levator Scapulae Stretch  - 2-3 x daily - 7 x weekly - 2 sets - 3 breaths hold - Seated Elbow Flexion AAROM  - 3-5 x daily - 7 x weekly - 1 sets - 10 reps - Seated Forearm Pronation and Supination AROM  - 3-5 x daily - 7 x weekly - 1 sets - 10 reps  Access Code: [email or web address] URL: [email or web address] Date: 02/20/2024 Prepared by: Ozell Sero  Exercises - Seated Wrist Flexion with Anchored Resistance  - 1 x daily - 4 x weekly - 2 sets - 12 reps - Wrist Extension with Resistance  - 1 x daily - 4 x  weekly - 2 sets - 12 reps - Shoulder External Rotation and Scapular Retraction with Resistance  - 1 x daily - 4 x weekly - 2 sets - 12 reps - Standing Single Arm Elbow Flexion with Resistance  - 1 x daily - 4 x weekly - 2 sets - 12 reps - Seated Elbow Extension with Self-Anchored Resistance  - 1 x daily - 4 x weekly - 2 sets - 12 reps   ASSESSMENT:  CLINICAL IMPRESSION: Pt. s/p Rt elbow arthroscopy with extensor carpi radialis brevis release 14 days ago.  Pt. reports 0-1/10 R elbow pain currently and presents with excellent incision healing.  Good R elbow/wrist ROM all planes with no increase c/o pain.  PT focused on issuing a progressive resistive ex. Program.  Overall doing very well and will ice after ex./ work-related tasks.    REHAB POTENTIAL: Good  CLINICAL DECISION MAKING: Stable/uncomplicated  EVALUATION COMPLEXITY: Low   GOALS: Goals reviewed with patient? Yes  SHORT TERM GOALS: Target date: 7/5  Full extension AROM without pain in elbow Baseline: Goal status: INITIAL    LONG TERM GOALS: Target date: POC date  Grip strength 85% of opposite hand Baseline: not appropriate to test at eval Goal status: INITIAL  2.  UEFI to reach ceiling score Baseline:  Goal status: INITIAL  3.  Independent in long term HEP for full UE Baseline:  Goal status: INITIAL  4.  Complete work activities pain <2/10 Baseline:  Goal status: INITIAL  PLAN:  PT FREQUENCY: 1-2x/week  PT DURATION: POC date  PLANNED INTERVENTIONS: 97164- PT Re-evaluation, 97750- Physical Performance Testing, 97110-Therapeutic exercises, 97530- Therapeutic activity, [id number]- Neuromuscular re-education, 97535- Self Care, 02859- Manual therapy, [id number]- Electrical stimulation (unattended), 20560 (1-2 muscles), 20561 (3+ muscles)- Dry Needling, Patient/Family education, Taping, Joint mobilization, Spinal mobilization, Scar mobilization, Cryotherapy, and Moist heat.  PLAN FOR NEXT SESSION: Progress R UE/forearm  strength  Ozell JAYSON Sero, PT, DPT # [phone] 02/20/2024, 2:04 PM

## 2024-02-23 ENCOUNTER — Encounter (HOSPITAL_BASED_OUTPATIENT_CLINIC_OR_DEPARTMENT_OTHER): Admitting: Physical Therapy

## 2024-02-28 ENCOUNTER — Encounter: Payer: Self-pay | Admitting: Physical Therapy

## 2024-02-28 ENCOUNTER — Ambulatory Visit: Attending: Orthopaedic Surgery | Admitting: Physical Therapy

## 2024-02-28 DIAGNOSIS — M25521 Pain in right elbow: Secondary | ICD-10-CM | POA: Diagnosis not present

## 2024-02-28 DIAGNOSIS — M6281 Muscle weakness (generalized): Secondary | ICD-10-CM | POA: Insufficient documentation

## 2024-02-28 DIAGNOSIS — M7711 Lateral epicondylitis, right elbow: Secondary | ICD-10-CM | POA: Diagnosis not present

## 2024-02-28 NOTE — Therapy (Signed)
 OUTPATIENT PHYSICAL THERAPY TREATMENT  Patient Name: [name] MRN: [id number] DOB:04-02-1961, 63 y.o., female Today's Date: 02/29/2024  END OF SESSION:  PT End of Session - 02/28/24 1724     Visit Number 4    Number of Visits 6    Date for PT Re-Evaluation 03/24/24    PT Start Time 1724    PT Stop Time 1810    PT Time Calculation (min) 46 min    Activity Tolerance Patient tolerated treatment well    Behavior During Therapy WFL for tasks assessed/performed         Past Medical History:  Diagnosis Date   Allergy    Arthritis    osteoarthritis - fingers   Asthma    Chicken pox    Chronic UTI    Cystocele    2nd degree   Endometriosis    Family history of polyps in the colon    Graves disease    History of heavy periods    Hole in the ear drum, right    Hyperlipemia    IBS (irritable bowel syndrome)    Perimenopausal    PONV (postoperative nausea and vomiting)    nausea only   Rectocele    mild   S/P endometrial ablation    Scoliosis    Skin cancer    SUI (stress urinary incontinence, female)    SVT (supraventricular tachycardia) (HCC)    Urine incontinence    Wears contact lenses    Wears hearing aid in both ears    Past Surgical History:  Procedure Laterality Date   CATARACT EXTRACTION W/PHACO Right 11/03/2021   Procedure: CATARACT EXTRACTION PHACO AND INTRAOCULAR LENS PLACEMENT (IOC) RIGHT;  Surgeon: Jaye Fallow, MD;  Location: [facility];  Service: Ophthalmology;  Laterality: Right;  2.98 0:27.0   CATARACT EXTRACTION W/PHACO Left 12/22/2021   Procedure: CATARACT EXTRACTION PHACO AND INTRAOCULAR LENS PLACEMENT (IOC) LEFT 2.38 00:26.0;  Surgeon: Jaye Fallow, MD;  Location: [facility];  Service: Ophthalmology;  Laterality: Left;   COLONOSCOPY WITH PROPOFOL  N/A 07/10/2018   Procedure: COLONOSCOPY WITH PROPOFOL ;  Surgeon: Viktoria Lamar DASEN, MD;  Location: [facility];  Service: Endoscopy;  Laterality: N/A;   COLONOSCOPY  WITH PROPOFOL  N/A 07/11/2023   Procedure: COLONOSCOPY WITH PROPOFOL ;  Surgeon: Therisa Bi, MD;  Location: [facility];  Service: Gastroenterology;  Laterality: N/A;   ELBOW ARTHROSCOPY Right 02/06/2024   Procedure: ARTHROSCOPY, ELBOW, WITH OPEN SURGERY IF INDICATED;  Surgeon: Genelle Standing, MD;  Location: [facility];  Service: Orthopedics;  Laterality: Right;  RIGHT ELBOW ARTHROSCOPY WITH PLICA EXCISION AND LATERAL EPICONDYLE RELEASE   endometrrial ablation     EYE SURGERY     knee athroscopy Left 2013   laprascopic surgery     endometreosis   NASOPHARYNGOSCOPY EUSTATION TUBE BALLOON DILATION Bilateral 05/18/2018   Procedure: NASOPHARYNGOSCOPY EUSTATION TUBE BALLOON DILATION OUTFRACTURE BILATERAL INFERIOR TURBINATES;  Surgeon: Edda Mt, MD;  Location: [facility];  Service: ENT;  Laterality: Bilateral;   NASOPHARYNGOSCOPY EUSTATION TUBE BALLOON DILATION     nova-sure     POLYPECTOMY  07/11/2023   Procedure: POLYPECTOMY;  Surgeon: Therisa Bi, MD;  Location: [facility];  Service: Gastroenterology;;   MERRILEE Right 02/06/2024   Procedure: MERRILEE;  Surgeon: Genelle Standing, MD;  Location: [facility];  Service: Orthopedics;  Laterality: Right;   THYROIDECTOMY, PARTIAL     TONSILLECTOMY AND ADENOIDECTOMY     tubes in ears     Patient Active Problem List  Diagnosis Date Noted   Plica syndrome of right elbow 02/06/2024   Lateral epicondylitis of right elbow 02/06/2024   Adenomatous polyp of colon 07/11/2023   Family history of colon cancer in father 07/11/2023   Allergy to environmental factors 08/22/2019   Disorder of urinary tract 08/22/2019   Hypertensive disorder 08/22/2019   Irregular heart beat 08/22/2019   Osteoarthritis 07/24/2019   Generalized osteoarthritis of hand 07/18/2019   Encounter for orthopedic follow-up care 10/10/2018   Digital mucous cyst 07/12/2018   History of Graves' disease 06/14/2018   Pure  hypercholesterolemia 06/14/2018   Rhinitis 06/14/2018   Rosacea 06/14/2018   Personal history of colon polyps, unspecified 05/09/2018   Midline cystocele 04/19/2018   Chest pain with low risk for cardiac etiology 10/26/2017   Borderline diabetes mellitus 11/05/2015   Chronic UTI 03/21/2015   SVT (supraventricular tachycardia) (HCC) 03/21/2015   Endometriosis 03/21/2015   Asthma 03/21/2015   Graves disease 03/21/2015   Irritable bowel syndrome 03/21/2015   Family history of breast cancer 03/21/2015   Stool incontinence 03/21/2015   Gastrocnemius tear 01/18/2014   Rupture of patellar tendon 01/18/2014   Bladder infection, chronic 05/24/2012   Incomplete emptying of bladder 05/24/2012   Mixed urge and stress incontinence 05/24/2012   REFERRING PROVIDER: Elspeth LITTIE Parker MD   REFERRING DIAG:  [phone] (ICD-10-CM) - Plica syndrome of right elbow     PROCEDURE: 1. Right elbow arthroscopy with extensor carpi radialis brevis release  Rationale for Evaluation and Treatment: Rehabilitation  THERAPY DIAG:  Lateral epicondylitis of right elbow  Pain in right elbow  Muscle weakness (generalized)  ONSET DATE: DOS 02/06/24  SUBJECTIVE:                                                                                                                                                                                           SUBJECTIVE STATEMENT: Not really sure what happened. I noticed popping/clicking with any rotation. Plica removed and released tennis elbow. 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   PERTINENT HISTORY:  N/a  PAIN:  Are you having pain? Yes: NPRS scale: 2/10 Pain location: Rt elbow Pain description: achey Aggravating factors: end range ext Relieving factors: rest  PRECAUTIONS:  None  RED FLAGS: None   WEIGHT BEARING RESTRICTIONS:  She will be activity as tolerated once her nerve block wears off.  She will begin elbow range of motion  FALLS:  Has patient  fallen in last 6 months? No  OCCUPATION:  Occupational nurse- blood pressure, first aid, bandages, eventual return to CPR teaching  PLOF:  Independent  PATIENT GOALS:  Return to work (  2wk per MD),   OBJECTIVE:  Note: Objective measures were completed at Evaluation unless otherwise noted.  PATIENT SURVEYS:  UEFS  Extreme difficulty/unable (0), Quite a bit of difficulty (1), Moderate difficulty (2), Little difficulty (3), No difficulty (4) Survey date:  6/19 EVAL  Any of your usual work, household or school activities 1   2. Your usual hobbies, recreational/sport activities 0   3. Lifting a bag of groceries to waist level 0   4. Lifting a bag of groceries above your head 0  5. Grooming your hair 1  6. Pushing up on your hands (I.e. from bathtub or chair) 0  7. Preparing food (I.e. peeling/cutting) 0  8. Driving  0  9. Vacuuming, sweeping, or raking 0  10. Dressing  2  11. Doing up buttons 0  12. Using tools/appliances 1  13. Opening doors 1  14. Cleaning  0  15. Tying or lacing shoes 0  16. Sleeping  3  17. Laundering clothes (I.e. washing, ironing, folding) 0  18. Opening a jar 0  19. Throwing a ball 0  20. Carrying a Scientist, water quality with your affected limb.  0  Score total:  9     COGNITIVE STATUS: Within functional limits for tasks assessed   SENSATION: WFL  EDEMA:  EVAL: minimal edema in elbow and hand as expected post op  POSTURE:  Eval: tends for forward rounded rt shoulder since she was in a sling  HAND DOMINANCE:  Right   Body Part #1 Rt Elbow  PALPATION: Eval: Full ROM stretchy end feel  Not appropriate to test MMT due to recent surgery, full PROM with some discomfort at end range with AROM in elbow ext and wrist extension                                            L/R shoulder abduction (4-/4), flexion (4/4+)- R elbow pain reported, bicep (5/4), tricep (4/5), supination (5/5), pronation (5/5)- pain in R forearm, sh. ER (5/4), sh. IR (5/5), wrist  extension (5/4), flexion (5/4-)- pain in proximal forearm.  5/5 on B hand intrinsics.     L/R grip strength (#3 setting):  45.1#/ 50.1#.                                                                                   TREATMENT DATE: 02/29/2024  Subjective:  Pt. reports no R elbow pain today and is 3 weeks s/p surgery.  Pt. Presents with excellent incision healing.  Pt. Has already progressed to GTB with resisted ex. And reports good compliance with HEP.   There.ex.:    Reassessment of R sh./elbow/wrist AROM (no limitations).  Supine bicep/tricep/sh. IR/ER MMT (4+)- no pain today.  Reviewed HEP (GTB)- instructed to avoid pain provoking movement patterns.   Seated B UBE 2.5 min. F/b.  Consistent cadence (2 lights).    Supine R shoulder flexion (3# dumbbell), horizontal abduction/ adduction (3#) 20x each.  Supine chest press (4#)/ serratus punches (4#)/ tricep extension (4#)- 20x each.  Supine bicep curls (5#) 20x.  Seated red therabar (supination/ pronation)- 10x2.  Grip strength with dynamometer.    Manual tx.:  No charge  STM to R distal biceps/ triceps and forearm.    Instructed to ice R elbow after tx at home.       PATIENT EDUCATION:  Education details: Teacher, music of condition, POC, HEP, exercise form/rationale Person educated: Patient Education method: Explanation, Demonstration, Tactile cues, Verbal cues, and Handouts Education comprehension: verbalized understanding, returned demonstration, verbal cues required, tactile cues required, and needs further education  HOME EXERCISE PROGRAM: Access Code: [email or web address] URL: [email or web address] Date: 02/09/2024 Prepared by: Harlene Cordon  Program Notes keep the shoulder moving  Exercises - Seated Scapular Retraction  - Standing Upper Trapezius Stretch  - 2-3 x daily - 7 x weekly - 2 sets - 3 breaths hold - Gentle Levator Scapulae Stretch  - 2-3 x daily - 7 x weekly - 2 sets - 3 breaths hold - Seated Elbow  Flexion AAROM  - 3-5 x daily - 7 x weekly - 1 sets - 10 reps - Seated Forearm Pronation and Supination AROM  - 3-5 x daily - 7 x weekly - 1 sets - 10 reps  Access Code: [email or web address] URL: [email or web address] Date: 02/20/2024 Prepared by: Ozell Sero  Exercises - Seated Wrist Flexion with Anchored Resistance  - 1 x daily - 4 x weekly - 2 sets - 12 reps - Wrist Extension with Resistance  - 1 x daily - 4 x weekly - 2 sets - 12 reps - Shoulder External Rotation and Scapular Retraction with Resistance  - 1 x daily - 4 x weekly - 2 sets - 12 reps - Standing Single Arm Elbow Flexion with Resistance  - 1 x daily - 4 x weekly - 2 sets - 12 reps - Seated Elbow Extension with Self-Anchored Resistance  - 1 x daily - 4 x weekly - 2 sets - 12 reps   ASSESSMENT:  CLINICAL IMPRESSION: Pt. s/p Rt elbow arthroscopy with extensor carpi radialis brevis release 3 weeks ago and reports 0/10 R elbow pain currently.  Pt. Had minimal muscle soreness with addition of resisted ex. And able to progress to GTB without limitations/ pain. Pt. presents with excellent incision healing.  Good R elbow/wrist ROM all planes with no increase c/o pain.  PT focused on issuing a progressive resistive ex. Program.  Overall doing very well and will ice after ex./ work-related tasks.    REHAB POTENTIAL: Good  CLINICAL DECISION MAKING: Stable/uncomplicated  EVALUATION COMPLEXITY: Low   GOALS: Goals reviewed with patient? Yes  SHORT TERM GOALS: Target date: 7/5  Full extension AROM without pain in elbow Baseline: 7/8: full elbow/wrist AROM (all planes) Goal status: Goal met   LONG TERM GOALS: Target date: POC date  Grip strength 85% of opposite hand Baseline: not appropriate to test at eval Goal status: INITIAL  2.  UEFI to reach ceiling score Baseline:  Goal status: INITIAL  3.  Independent in long term HEP for full UE Baseline:  Goal status: INITIAL  4.  Complete work activities pain  <2/10 Baseline:  Goal status: INITIAL  PLAN:  PT FREQUENCY: 1-2x/week  PT DURATION: POC date  PLANNED INTERVENTIONS: 97164- PT Re-evaluation, 97750- Physical Performance Testing, 97110-Therapeutic exercises, 97530- Therapeutic activity, [id number]- Neuromuscular re-education, 97535- Self Care, [id number]- Manual therapy, [id number]- Electrical stimulation (unattended), 20560 (1-2 muscles), 20561 (3+ muscles)- Dry Needling, Patient/Family education, Taping, Joint mobilization, Spinal mobilization, Scar mobilization, Cryotherapy, and Moist heat.  PLAN FOR NEXT SESSION: Progress R UE/forearm strength  as tolerated with no pain  Ozell JAYSON Sero, PT, DPT # [phone] 02/29/2024, 7:04 AM

## 2024-03-01 ENCOUNTER — Encounter (HOSPITAL_BASED_OUTPATIENT_CLINIC_OR_DEPARTMENT_OTHER): Admitting: Physical Therapy

## 2024-03-01 ENCOUNTER — Ambulatory Visit: Admitting: Physical Therapy

## 2024-03-05 ENCOUNTER — Encounter (HOSPITAL_BASED_OUTPATIENT_CLINIC_OR_DEPARTMENT_OTHER)

## 2024-03-06 ENCOUNTER — Ambulatory Visit

## 2024-03-06 ENCOUNTER — Encounter: Payer: Self-pay | Admitting: Physical Therapy

## 2024-03-06 DIAGNOSIS — M6281 Muscle weakness (generalized): Secondary | ICD-10-CM

## 2024-03-06 DIAGNOSIS — M7711 Lateral epicondylitis, right elbow: Secondary | ICD-10-CM | POA: Diagnosis not present

## 2024-03-06 DIAGNOSIS — M25521 Pain in right elbow: Secondary | ICD-10-CM

## 2024-03-06 NOTE — Therapy (Signed)
 OUTPATIENT PHYSICAL THERAPY DISCHARGE  Patient Name: [name] MRN: [id number] DOB:Apr 18, 1961, 64 y.o., female Today's Date: 03/06/2024  END OF SESSION:  PT End of Session - 03/06/24 1727     Visit Number 5    Number of Visits 6    Date for PT Re-Evaluation 03/24/24    PT Start Time 1727    PT Stop Time 1750    PT Time Calculation (min) 23 min    Activity Tolerance Patient tolerated treatment well    Behavior During Therapy WFL for tasks assessed/performed          Past Medical History:  Diagnosis Date   Allergy    Arthritis    osteoarthritis - fingers   Asthma    Chicken pox    Chronic UTI    Cystocele    2nd degree   Endometriosis    Family history of polyps in the colon    Graves disease    History of heavy periods    Hole in the ear drum, right    Hyperlipemia    IBS (irritable bowel syndrome)    Perimenopausal    PONV (postoperative nausea and vomiting)    nausea only   Rectocele    mild   S/P endometrial ablation    Scoliosis    Skin cancer    SUI (stress urinary incontinence, female)    SVT (supraventricular tachycardia) (HCC)    Urine incontinence    Wears contact lenses    Wears hearing aid in both ears    Past Surgical History:  Procedure Laterality Date   CATARACT EXTRACTION W/PHACO Right 11/03/2021   Procedure: CATARACT EXTRACTION PHACO AND INTRAOCULAR LENS PLACEMENT (IOC) RIGHT;  Surgeon: Jaye Fallow, MD;  Location: [facility];  Service: Ophthalmology;  Laterality: Right;  2.98 0:27.0   CATARACT EXTRACTION W/PHACO Left 12/22/2021   Procedure: CATARACT EXTRACTION PHACO AND INTRAOCULAR LENS PLACEMENT (IOC) LEFT 2.38 00:26.0;  Surgeon: Jaye Fallow, MD;  Location: [facility];  Service: Ophthalmology;  Laterality: Left;   COLONOSCOPY WITH PROPOFOL  N/A 07/10/2018   Procedure: COLONOSCOPY WITH PROPOFOL ;  Surgeon: Viktoria Lamar DASEN, MD;  Location: [facility];  Service: Endoscopy;  Laterality: N/A;   COLONOSCOPY  WITH PROPOFOL  N/A 07/11/2023   Procedure: COLONOSCOPY WITH PROPOFOL ;  Surgeon: Therisa Bi, MD;  Location: [facility] ENDOSCOPY;  Service: Gastroenterology;  Laterality: N/A;   ELBOW ARTHROSCOPY Right 02/06/2024   Procedure: ARTHROSCOPY, ELBOW, WITH OPEN SURGERY IF INDICATED;  Surgeon: Genelle Standing, MD;  Location: [facility];  Service: Orthopedics;  Laterality: Right;  RIGHT ELBOW ARTHROSCOPY WITH PLICA EXCISION AND LATERAL EPICONDYLE RELEASE   endometrrial ablation     EYE SURGERY     knee athroscopy Left 2013   laprascopic surgery     endometreosis   NASOPHARYNGOSCOPY EUSTATION TUBE BALLOON DILATION Bilateral 05/18/2018   Procedure: NASOPHARYNGOSCOPY EUSTATION TUBE BALLOON DILATION OUTFRACTURE BILATERAL INFERIOR TURBINATES;  Surgeon: Edda Mt, MD;  Location: [facility];  Service: ENT;  Laterality: Bilateral;   NASOPHARYNGOSCOPY EUSTATION TUBE BALLOON DILATION     nova-sure     POLYPECTOMY  07/11/2023   Procedure: POLYPECTOMY;  Surgeon: Therisa Bi, MD;  Location: [facility] ENDOSCOPY;  Service: Gastroenterology;;   MERRILEE Right 02/06/2024   Procedure: MERRILEE;  Surgeon: Genelle Standing, MD;  Location: [facility];  Service: Orthopedics;  Laterality: Right;   THYROIDECTOMY, PARTIAL     TONSILLECTOMY AND ADENOIDECTOMY     tubes in ears     Patient Active Problem  List   Diagnosis Date Noted   Plica syndrome of right elbow 02/06/2024   Lateral epicondylitis of right elbow 02/06/2024   Adenomatous polyp of colon 07/11/2023   Family history of colon cancer in father 07/11/2023   Allergy to environmental factors 08/22/2019   Disorder of urinary tract 08/22/2019   Hypertensive disorder 08/22/2019   Irregular heart beat 08/22/2019   Osteoarthritis 07/24/2019   Generalized osteoarthritis of hand 07/18/2019   Encounter for orthopedic follow-up care 10/10/2018   Digital mucous cyst 07/12/2018   History of Graves' disease 06/14/2018   Pure  hypercholesterolemia 06/14/2018   Rhinitis 06/14/2018   Rosacea 06/14/2018   Personal history of colon polyps, unspecified 05/09/2018   Midline cystocele 04/19/2018   Chest pain with low risk for cardiac etiology 10/26/2017   Borderline diabetes mellitus 11/05/2015   Chronic UTI 03/21/2015   SVT (supraventricular tachycardia) (HCC) 03/21/2015   Endometriosis 03/21/2015   Asthma 03/21/2015   Graves disease 03/21/2015   Irritable bowel syndrome 03/21/2015   Family history of breast cancer 03/21/2015   Stool incontinence 03/21/2015   Gastrocnemius tear 01/18/2014   Rupture of patellar tendon 01/18/2014   Bladder infection, chronic 05/24/2012   Incomplete emptying of bladder 05/24/2012   Mixed urge and stress incontinence 05/24/2012   REFERRING PROVIDER: Elspeth LITTIE Parker MD   REFERRING DIAG:  [id number] (ICD-10-CM) - Plica syndrome of right elbow     PROCEDURE: 1. Right elbow arthroscopy with extensor carpi radialis brevis release  Rationale for Evaluation and Treatment: Rehabilitation  THERAPY DIAG:  Lateral epicondylitis of right elbow  Pain in right elbow  Muscle weakness (generalized)  ONSET DATE: DOS 02/06/24  SUBJECTIVE:                                                                                                                                                                                           SUBJECTIVE STATEMENT: Not really sure what happened. I noticed popping/clicking with any rotation. Plica removed and released tennis elbow. 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   PERTINENT HISTORY:  N/a  PAIN:  Are you having pain? Yes: NPRS scale: 2/10 Pain location: Rt elbow Pain description: achey Aggravating factors: end range ext Relieving factors: rest  PRECAUTIONS:  None  RED FLAGS: None   WEIGHT BEARING RESTRICTIONS:  She will be activity as tolerated once her nerve block wears off.  She will begin elbow range of motion  FALLS:  Has patient  fallen in last 6 months? No  OCCUPATION:  Occupational nurse- blood pressure, first aid, bandages, eventual return to CPR teaching  PLOF:  Independent  PATIENT GOALS:  Return to work (2wk per MD),   OBJECTIVE:  Note: Objective measures were completed at Evaluation unless otherwise noted.  PATIENT SURVEYS:  UEFS  Extreme difficulty/unable (0), Quite a bit of difficulty (1), Moderate difficulty (2), Little difficulty (3), No difficulty (4) Survey date:  6/19 EVAL 7/15:  Any of your usual work, household or school activities 1  4  2. Your usual hobbies, recreational/sport activities 0 4   3. Lifting a bag of groceries to waist level 0 4   4. Lifting a bag of groceries above your head 0 4  5. Grooming your hair 1 4  6. Pushing up on your hands (I.e. from bathtub or chair) 0 4  7. Preparing food (I.e. peeling/cutting) 0 4  8. Driving  0 4  9. Vacuuming, sweeping, or raking 0 4  10. Dressing  2 4  11. Doing up buttons 0 4  12. Using tools/appliances 1 4  13. Opening doors 1 4  14. Cleaning  0 4  15. Tying or lacing shoes 0 4  16. Sleeping  3 4  17. Laundering clothes (I.e. washing, ironing, folding) 0 4  18. Opening a jar 0 4  19. Throwing a ball 0 4  20. Carrying a small suitcase with your affected limb.  0 4  Score total:  9 80     COGNITIVE STATUS: Within functional limits for tasks assessed   SENSATION: WFL  EDEMA:  EVAL: minimal edema in elbow and hand as expected post op  POSTURE:  Eval: tends for forward rounded rt shoulder since she was in a sling  HAND DOMINANCE:  Right   Body Part #1 Rt Elbow  PALPATION: Eval: Full ROM stretchy end feel  Not appropriate to test MMT due to recent surgery, full PROM with some discomfort at end range with AROM in elbow ext and wrist extension                                            L/R shoulder abduction (4-/4), flexion (4/4+)- R elbow pain reported, bicep (5/4), tricep (4/5), supination (5/5), pronation (5/5)- pain  in R forearm, sh. ER (5/4), sh. IR (5/5), wrist extension (5/4), flexion (5/4-)- pain in proximal forearm.  5/5 on B hand intrinsics.     L/R grip strength (#3 setting):  45.1#/ 50.1#.                                                                                   TREATMENT DATE: 03/06/2024   Subjective: patient reports she feels back to normal with no pain in her elbow. She feels that she can manage on her own at home.   Physical therapy treatment session today consisted of completing assessment of goals and administration of testing as demonstrated and documented in flow sheet, treatment, and goals section of this note. Addition treatments may be found below.      PATIENT EDUCATION:  Education details: Teacher, music of condition, POC, HEP, exercise form/rationale Person educated: Patient Education method: Explanation, Demonstration, Tactile cues, Verbal cues, and Handouts Education  comprehension: verbalized understanding, returned demonstration, verbal cues required, tactile cues required, and needs further education  HOME EXERCISE PROGRAM: Access Code: [email or web address] URL: [email or web address] Date: 02/09/2024 Prepared by: Harlene Cordon  Program Notes keep the shoulder moving  Exercises - Seated Scapular Retraction  - Standing Upper Trapezius Stretch  - 2-3 x daily - 7 x weekly - 2 sets - 3 breaths hold - Gentle Levator Scapulae Stretch  - 2-3 x daily - 7 x weekly - 2 sets - 3 breaths hold - Seated Elbow Flexion AAROM  - 3-5 x daily - 7 x weekly - 1 sets - 10 reps - Seated Forearm Pronation and Supination AROM  - 3-5 x daily - 7 x weekly - 1 sets - 10 reps  Access Code: [email or web address] URL: [email or web address] Date: 02/20/2024 Prepared by: Ozell Sero  Exercises - Seated Wrist Flexion with Anchored Resistance  - 1 x daily - 4 x weekly - 2 sets - 12 reps - Wrist Extension with Resistance  - 1 x daily - 4 x weekly - 2 sets - 12 reps - Shoulder External  Rotation and Scapular Retraction with Resistance  - 1 x daily - 4 x weekly - 2 sets - 12 reps - Standing Single Arm Elbow Flexion with Resistance  - 1 x daily - 4 x weekly - 2 sets - 12 reps - Seated Elbow Extension with Self-Anchored Resistance  - 1 x daily - 4 x weekly - 2 sets - 12 reps   ASSESSMENT:  CLINICAL IMPRESSION:   Pt. s/p Rt elbow arthroscopy with extensor carpi radialis brevis release 4 weeks ago and reports 0/10 R elbow pain currently.  Patient has met all PT goals and is having no pain with daily activities and work activities. R elbow/wrist ROM WFL with no increase in pain. Grip strength nearly symmetrical with R>L. Patient agreeable to discharge from PT to independent ability to perform HEP as tolerated.   REHAB POTENTIAL: Good  CLINICAL DECISION MAKING: Stable/uncomplicated  EVALUATION COMPLEXITY: Low   GOALS: Goals reviewed with patient? Yes  SHORT TERM GOALS: Target date: 7/5  Full extension AROM without pain in elbow Baseline: 7/8: full elbow/wrist AROM (all planes) Goal status: Goal met   LONG TERM GOALS: Target date: POC date  Grip strength 85% of opposite hand Baseline: not appropriate to test at eval; 7/15: 50.9 L/53.4 R - fairly symmetrical  Goal status: INITIAL  2.  UEFI to reach ceiling score Baseline: 7/15: 80/80 Goal status: INITIAL  3.  Independent in long term HEP for full UE Baseline:  7/15: independent with HEP  Goal status: INITIAL  4.  Complete work activities pain <2/10 Baseline: 7/15: 0/10 pain with work  Goal status: INITIAL  PLAN:  PT FREQUENCY: 1-2x/week  PT DURATION: POC date  PLANNED INTERVENTIONS: 97164- PT Re-evaluation, 97750- Physical Performance Testing, 97110-Therapeutic exercises, 97530- Therapeutic activity, [id number]- Neuromuscular re-education, [phone]- Self Care, 02859- Manual therapy, [id number]- Electrical stimulation (unattended), [phone] (1-2 muscles), 20561 (3+ muscles)- Dry Needling, Patient/Family education, Taping,  Joint mobilization, Spinal mobilization, Scar mobilization, Cryotherapy, and Moist heat.   Maryanne Finder, PT, DPT Physical Therapist - [facility] 03/06/2024, 5:52 PM

## 2024-03-08 ENCOUNTER — Encounter: Admitting: Physical Therapy

## 2024-03-08 ENCOUNTER — Encounter (HOSPITAL_BASED_OUTPATIENT_CLINIC_OR_DEPARTMENT_OTHER): Admitting: Physical Therapy

## 2024-03-12 ENCOUNTER — Encounter (HOSPITAL_BASED_OUTPATIENT_CLINIC_OR_DEPARTMENT_OTHER)

## 2024-03-15 ENCOUNTER — Encounter (HOSPITAL_BASED_OUTPATIENT_CLINIC_OR_DEPARTMENT_OTHER): Admitting: Physical Therapy

## 2024-03-22 ENCOUNTER — Ambulatory Visit (INDEPENDENT_AMBULATORY_CARE_PROVIDER_SITE_OTHER): Admitting: Orthopaedic Surgery

## 2024-03-22 DIAGNOSIS — M67821 Other specified disorders of synovium, right elbow: Secondary | ICD-10-CM

## 2024-03-22 NOTE — Progress Notes (Signed)
 Post Operative Evaluation    Procedure/Date of Surgery: Right shoulder arthroscopic lateral epicondylar release with plica excision 6/16  Interval History:   Presents 6 weeks status post the above procedure.  Overall she is doing very well.  She has no pain at today's visit.  Seldom has an occasional click is knowledgeable to do all things without pain   PMH/PSH/Family History/Social History/Meds/Allergies:    Past Medical History:  Diagnosis Date   Allergy    Arthritis    osteoarthritis - fingers   Asthma    Chicken pox    Chronic UTI    Cystocele    2nd degree   Endometriosis    Family history of polyps in the colon    Graves disease    History of heavy periods    Hole in the ear drum, right    Hyperlipemia    IBS (irritable bowel syndrome)    Perimenopausal    PONV (postoperative nausea and vomiting)    nausea only   Rectocele    mild   S/P endometrial ablation    Scoliosis    Skin cancer    SUI (stress urinary incontinence, female)    SVT (supraventricular tachycardia) (HCC)    Urine incontinence    Wears contact lenses    Wears hearing aid in both ears    Past Surgical History:  Procedure Laterality Date   CATARACT EXTRACTION W/PHACO Right 11/03/2021   Procedure: CATARACT EXTRACTION PHACO AND INTRAOCULAR LENS PLACEMENT (IOC) RIGHT;  Surgeon: Jaye Fallow, MD;  Location: [facility];  Service: Ophthalmology;  Laterality: Right;  2.98 0:27.0   CATARACT EXTRACTION W/PHACO Left 12/22/2021   Procedure: CATARACT EXTRACTION PHACO AND INTRAOCULAR LENS PLACEMENT (IOC) LEFT 2.38 00:26.0;  Surgeon: Jaye Fallow, MD;  Location: [facility];  Service: Ophthalmology;  Laterality: Left;   COLONOSCOPY WITH PROPOFOL  N/A 07/10/2018   Procedure: COLONOSCOPY WITH PROPOFOL ;  Surgeon: Viktoria Lamar DASEN, MD;  Location: [facility];  Service: Endoscopy;  Laterality: N/A;   COLONOSCOPY WITH PROPOFOL  N/A 07/11/2023    Procedure: COLONOSCOPY WITH PROPOFOL ;  Surgeon: Therisa Bi, MD;  Location: [facility] END[facility]COPY;  Service: Gastroenterology;  Laterality: N/A;   ELBOW ARTHROSCOPY Right 02/06/2024   Procedure: ARTHROSCOPY, ELBOW, WITH OPEN SURGERY IF INDICATED;  Surgeon: Genelle Standing, MD;  Location: [facility];  Service: Orthopedics;  Laterality: Right;  RIGHT ELBOW ARTHROSCOPY WITH PLICA EXCISION AND LATERAL EPICONDYLE RELEASE   endometrrial ablation     EYE SURGERY     knee athroscopy Left 2013   laprascopic surgery     endometreosis   NASOPHARYNGOSCOPY EUSTATION TUBE BALLOON DILATION Bilateral 05/18/2018   Procedure: NASOPHARYNGOSCOPY EUSTATION TUBE BALLOON DILATION OUTFRACTURE BILATERAL INFERIOR TURBINATES;  Surgeon: Edda Mt, MD;  Location: [facility];  Service: ENT;  Laterality: Bilateral;   NASOPHARYNGOSCOPY EUSTATION TUBE BALLOON DILATION     nova-sure     POLYPECTOMY  07/11/2023   Procedure: POLYPECTOMY;  Surgeon: Therisa Bi, MD;  Location: [facility] ENDOSCOPY;  Service: Gastroenterology;;   MERRILEE Right 02/06/2024   Procedure: MERRILEE;  Surgeon: Genelle Standing, MD;  Location: [facility];  Service: Orthopedics;  Laterality: Right;   THYROIDECTOMY, PARTIAL     TONSILLECTOMY AND ADENOIDECTOMY     tubes in ears     Social History   Socioeconomic History  Marital status: Married    Spouse name: Not on file   Number of children: Not on file   Years of education: Not on file   Highest education level: Not on file  Occupational History   Occupation: [employer]  Tobacco Use   Smoking status: Never    Passive exposure: Never   Smokeless tobacco: Never  Vaping Use   Vaping status: Never Used  Substance and Sexual Activity   Alcohol use: No    Alcohol/week: 0.0 standard drinks of alcohol   Drug use: No   Sexual activity: Yes    Birth control/protection: Post-menopausal  Other Topics Concern   Not on file  Social History Narrative   Patient  is a Engineer, civil (consulting) :)   Social Drivers of Health   Financial Resource Strain: Low Risk  (12/17/2023)   Received from [facility]   Overall Financial Resource Strain (CARDIA)    Difficulty of Paying Living Expenses: Not hard at all  Food Insecurity: No Food Insecurity (12/17/2023)   [facility] from [facility]   Hunger Vital Sign    Within the past 12 months, you worried that your food would run out before you got the money to buy more.: Never true    Within the past 12 months, the food you bought just didn't last and you didn't have money to get more.: Never true  Transportation Needs: No Transportation Needs (12/17/2023)   [facility] - Transportation    In the past 12 months, has lack of transportation kept you from medical appointments or from getting medications?: No    Lack of Transportation (Non-Medical): No  Physical Activity: Sufficiently Active (04/19/2018)   Exercise Vital Sign    Days of Exercise per Week: 4 days    Minutes of Exercise per Session: 40 min  Stress: Not on file  Social Connections: Not on file   Family History  Problem Relation Age of Onset   Cancer Mother    Hyperlipidemia Mother    Heart disease Mother    Hypertension Mother    Breast cancer Mother 7   Kidney failure Mother    Stroke Mother    Cancer Father        colon   Hyperlipidemia Father    Heart disease Father    Hypertension Father    Breast cancer Maternal Aunt 4   Breast cancer Paternal Aunt 91   Kidney cancer Neg Hx    Bladder Cancer Neg Hx    Prostate cancer Neg Hx    Ovarian cancer Neg Hx    Diabetes Neg Hx    Allergies  Allergen Reactions   Ciprofloxacin Anaphylaxis   Floxin [Ofloxacin] Anaphylaxis   Levofloxacin Anaphylaxis   Quinolones Anaphylaxis   Amoxicillin     fever   Bextra [Valdecoxib]    Gyne-Lotrimin [Clotrimazole]     redness   Penicillins Rash   Septra [Sulfamethoxazole-Trimethoprim ] Rash     Flushed and fever.   Sulfa Antibiotics Rash    fever   Current Outpatient Medications  Medication Sig Dispense Refill   albuterol  (VENTOLIN  HFA) 108 (90 Base) MCG/ACT inhaler Inhale 2 puffs into the lungs every 4 (four) hours as needed for wheezing. 18 g 1   aspirin  EC 325 MG tablet Take 1 tablet (325 mg total) by mouth daily. 14 tablet 0   cholecalciferol (VITAMIN D) 1000 UNITS tablet Take 1,000 Units by mouth daily.  EPINEPHrine  0.3 mg/0.3 mL IJ SOAJ injection Inject 0.3 mLs (0.3 mg total) into the muscle once as needed for up to 1 dose. 2 each 1   fexofenadine (ALLEGRA) 180 MG tablet Allegra Allergy 180 mg tablet  Take 1 tablet every day by oral route.     fluticasone  (FLONASE ) 50 MCG/ACT nasal spray Spray 2 sprays into both nostrils once daily as needed. 48 g 1   imipramine  (TOFRANIL ) 25 MG tablet Take 2 tablets (50 mg total) by mouth at bedtime. 200 tablet 1   levothyroxine  (SYNTHROID ) 100 MCG tablet Take 1 tablet (100 mcg total) by mouth daily. 90 tablet 1   lovastatin  (MEVACOR ) 20 MG tablet Take 1 tablet (20 mg total) by mouth daily with supper. 100 tablet 1   metoprolol  succinate (TOPROL -XL) 50 MG 24 hr tablet Take 1 tablet (50 mg total) by mouth daily. 90 tablet 3   montelukast  (SINGULAIR ) 10 MG tablet Take 1 tablet (10 mg total) by mouth at bedtime 90 tablet 1   Multiple Vitamins-Minerals (MULTIVITAMIN ADULT) CHEW Chew 1 tablet by mouth daily.     nitrofurantoin  (MACRODANTIN ) 50 MG capsule Take 1 capsule (50 mg total) by mouth daily. 90 capsule 1   oxyCODONE  (ROXICODONE ) 5 MG immediate release tablet Take 1 tablet (5 mg total) by mouth every 4 (four) hours as needed for severe pain (pain score 7-10) or breakthrough pain. 5 tablet 0   No current facility-administered medications for this visit.   No results found.  Review of Systems:   A ROS was performed including pertinent positives and negatives as documented in the HPI.   Musculoskeletal Exam:     Right elbow  incisions are well-appearing without erythema or drainage.  Distal neurosensory exam is intact.  Full range of motion about the elbow with full pro supination and no pain about the lateral epicondyle  Imaging:      I personally reviewed and interpreted the radiographs.   Assessment:   6 weeks status post right arthroscopic tennis elbow release overall doing extremely well.  This time she will return to full activity and I will plan to see her back as needed  Plan :    - Return to clinic as needed      I personally saw and evaluated the patient, and participated in the management and treatment plan.  Elspeth Parker, MD Attending Physician, Orthopedic Surgery  This document was dictated using Dragon voice recognition software. A reasonable attempt at proof reading has been made to minimize errors.

## 2024-03-26 ENCOUNTER — Encounter (HOSPITAL_BASED_OUTPATIENT_CLINIC_OR_DEPARTMENT_OTHER): Payer: Self-pay

## 2024-04-08 ENCOUNTER — Other Ambulatory Visit: Payer: Self-pay

## 2024-04-09 ENCOUNTER — Other Ambulatory Visit: Payer: Self-pay

## 2024-04-09 MED ORDER — LOVASTATIN 20 MG PO TABS
20.0000 mg | ORAL_TABLET | Freq: Every day | ORAL | 1 refills | Status: AC
  Filled 2024-04-09: qty 90, 90d supply, fill #0
  Filled 2024-07-07: qty 90, 90d supply, fill #1

## 2024-06-12 DIAGNOSIS — E7800 Pure hypercholesterolemia, unspecified: Secondary | ICD-10-CM | POA: Diagnosis not present

## 2024-06-12 DIAGNOSIS — R7303 Prediabetes: Secondary | ICD-10-CM | POA: Diagnosis not present

## 2024-06-12 DIAGNOSIS — Z8639 Personal history of other endocrine, nutritional and metabolic disease: Secondary | ICD-10-CM | POA: Diagnosis not present

## 2024-06-20 ENCOUNTER — Other Ambulatory Visit: Payer: Self-pay

## 2024-06-20 DIAGNOSIS — E7800 Pure hypercholesterolemia, unspecified: Secondary | ICD-10-CM | POA: Diagnosis not present

## 2024-06-20 DIAGNOSIS — Z8639 Personal history of other endocrine, nutritional and metabolic disease: Secondary | ICD-10-CM | POA: Diagnosis not present

## 2024-06-20 DIAGNOSIS — R7303 Prediabetes: Secondary | ICD-10-CM | POA: Diagnosis not present

## 2024-06-20 MED ORDER — LEVOTHYROXINE SODIUM 88 MCG PO TABS
88.0000 ug | ORAL_TABLET | Freq: Every morning | ORAL | 1 refills | Status: AC
  Filled 2024-06-20: qty 90, 90d supply, fill #0
  Filled 2024-09-10: qty 90, 90d supply, fill #1

## 2024-06-25 ENCOUNTER — Encounter: Payer: Self-pay | Admitting: Radiology

## 2024-07-04 DIAGNOSIS — D2262 Melanocytic nevi of left upper limb, including shoulder: Secondary | ICD-10-CM | POA: Diagnosis not present

## 2024-07-04 DIAGNOSIS — D2261 Melanocytic nevi of right upper limb, including shoulder: Secondary | ICD-10-CM | POA: Diagnosis not present

## 2024-07-04 DIAGNOSIS — D2272 Melanocytic nevi of left lower limb, including hip: Secondary | ICD-10-CM | POA: Diagnosis not present

## 2024-07-04 DIAGNOSIS — D225 Melanocytic nevi of trunk: Secondary | ICD-10-CM | POA: Diagnosis not present

## 2024-07-04 DIAGNOSIS — R208 Other disturbances of skin sensation: Secondary | ICD-10-CM | POA: Diagnosis not present

## 2024-07-04 DIAGNOSIS — L820 Inflamed seborrheic keratosis: Secondary | ICD-10-CM | POA: Diagnosis not present

## 2024-07-04 DIAGNOSIS — Z85828 Personal history of other malignant neoplasm of skin: Secondary | ICD-10-CM | POA: Diagnosis not present

## 2024-07-04 DIAGNOSIS — R58 Hemorrhage, not elsewhere classified: Secondary | ICD-10-CM | POA: Diagnosis not present

## 2024-07-04 DIAGNOSIS — L538 Other specified erythematous conditions: Secondary | ICD-10-CM | POA: Diagnosis not present

## 2024-07-07 ENCOUNTER — Other Ambulatory Visit: Payer: Self-pay

## 2024-07-08 ENCOUNTER — Other Ambulatory Visit: Payer: Self-pay

## 2024-07-09 ENCOUNTER — Other Ambulatory Visit: Payer: Self-pay

## 2024-07-09 MED ORDER — MONTELUKAST SODIUM 10 MG PO TABS
10.0000 mg | ORAL_TABLET | Freq: Every day | ORAL | 1 refills | Status: AC
  Filled 2024-07-09: qty 90, 90d supply, fill #0

## 2024-07-11 ENCOUNTER — Ambulatory Visit (HOSPITAL_BASED_OUTPATIENT_CLINIC_OR_DEPARTMENT_OTHER): Admitting: Orthopaedic Surgery

## 2024-07-11 ENCOUNTER — Other Ambulatory Visit: Payer: Self-pay | Admitting: Family Medicine

## 2024-07-11 DIAGNOSIS — M3501 Sicca syndrome with keratoconjunctivitis: Secondary | ICD-10-CM | POA: Diagnosis not present

## 2024-07-11 DIAGNOSIS — H43813 Vitreous degeneration, bilateral: Secondary | ICD-10-CM | POA: Diagnosis not present

## 2024-07-11 DIAGNOSIS — Z1231 Encounter for screening mammogram for malignant neoplasm of breast: Secondary | ICD-10-CM

## 2024-07-11 DIAGNOSIS — H26493 Other secondary cataract, bilateral: Secondary | ICD-10-CM | POA: Diagnosis not present

## 2024-07-31 ENCOUNTER — Other Ambulatory Visit: Payer: Self-pay

## 2024-08-01 ENCOUNTER — Other Ambulatory Visit: Payer: Self-pay

## 2024-08-01 DIAGNOSIS — M159 Polyosteoarthritis, unspecified: Secondary | ICD-10-CM | POA: Diagnosis not present

## 2024-08-02 ENCOUNTER — Other Ambulatory Visit: Payer: Self-pay

## 2024-08-02 MED ORDER — IMIPRAMINE HCL 25 MG PO TABS
50.0000 mg | ORAL_TABLET | Freq: Every day | ORAL | 1 refills | Status: AC
  Filled 2024-08-02: qty 180, 90d supply, fill #0

## 2024-08-21 ENCOUNTER — Ambulatory Visit (INDEPENDENT_AMBULATORY_CARE_PROVIDER_SITE_OTHER): Admitting: Certified Nurse Midwife

## 2024-08-21 ENCOUNTER — Encounter: Payer: Self-pay | Admitting: Certified Nurse Midwife

## 2024-08-21 VITALS — BP 137/86 | HR 99 | Ht 62.0 in | Wt 129.1 lb

## 2024-08-21 DIAGNOSIS — N8111 Cystocele, midline: Secondary | ICD-10-CM

## 2024-08-21 DIAGNOSIS — Z01419 Encounter for gynecological examination (general) (routine) without abnormal findings: Secondary | ICD-10-CM

## 2024-08-21 DIAGNOSIS — Z1231 Encounter for screening mammogram for malignant neoplasm of breast: Secondary | ICD-10-CM

## 2024-08-21 NOTE — Progress Notes (Signed)
 "  ANNUAL EXAM Patient name: [name] MRN [id number]  Date of birth: [birth year] Chief Complaint:   Gynecologic Exam (No concerns)  History of Present Illness:   [name] is a 63 y.o. G39P2002 Caucasian female being seen today for a routine annual exam.  Current complaints: none, continues with macrodantin  three times weekly for recurrent UTIs (has tried estrogen cream in past without relief & experienced side effects). Mammogram scheduled tomorrow. Colonoscopy & pap up to date. Has completed screening labwork with PCP. Occupational health RN Exercises regularly, 4-5x/w Received flu vaccine this season.   Patient's last menstrual period was 07/01/2014.       Upstream - 08/21/24 9147       Pregnancy Intention Screening   Does the patient want to become pregnant in the next year? No    Does the patient's partner want to become pregnant in the next year? No    Would the patient like to discuss contraceptive options today? No      Contraception Wrap Up   Current Method Post-Menopause    End Method Post-Menopause    Contraception Counseling Provided No    How was the end contraceptive method provided? N/A         The pregnancy intention screening data noted above was reviewed. Potential methods of contraception were discussed. The patient elected to proceed with Post-Menopause.      Component Value Date/Time   DIAGPAP  08/10/2023 1403    - Negative for Intraepithelial Lesions or Malignancy (NILM)   DIAGPAP - Benign reactive/reparative changes 08/10/2023 1403   DIAGPAP  05/07/2020 1459    - Negative for intraepithelial lesion or malignancy (NILM)   HPVHIGH Negative 08/10/2023 1403   HPVHIGH Negative 05/07/2020 1459   ADEQPAP  08/10/2023 1403    Satisfactory for evaluation; transformation zone component PRESENT.   ADEQPAP  05/07/2020 1459    Satisfactory for evaluation; transformation zone component PRESENT.      Last pap 08/09/28. Results were: NILM w/  HRHPV negative. H/O abnormal pap: no Last mammogram: 08/03/2023. Results were: normal. Family h/o breast cancer: yes Mother, Paternal and Maternal Aunts Last colonoscopy: 07/11/23. Results were: normal. Family h/o colorectal cancer: yes Father     08/21/2024    8:53 AM 09/27/2023    1:58 PM 08/03/2022    8:07 AM 05/13/2021    2:12 PM  Depression screen PHQ 2/9  Decreased Interest 0 0 0 0  Down, Depressed, Hopeless 0 0 0 0  PHQ - 2 Score 0 0 0 0         Past Medical History:  Diagnosis Date   Allergy 1970   Seasonal   Arthritis 2020   osteoarthritis - fingers   Asthma 1980   Chicken pox    Chronic UTI    Cystocele    2nd degree   Endometriosis    Family history of polyps in the colon    Graves disease    History of heavy periods    Hole in the ear drum, right    Hyperlipemia    IBS (irritable bowel syndrome)    Perimenopausal    PONV (postoperative nausea and vomiting)    nausea only   Rectocele    mild   S/P endometrial ablation    Scoliosis    Skin cancer    SUI (stress urinary incontinence, female)    SVT (supraventricular tachycardia)    Urine incontinence    Wears contact lenses    Wears  hearing aid in both ears     Family History  Problem Relation Age of Onset   Cancer Mother    Hyperlipidemia Mother    Heart disease Mother    Hypertension Mother    Breast cancer Mother 70   Kidney failure Mother    Stroke Mother    Arthritis Mother    Kidney disease Mother    Cancer Father        colon   Hyperlipidemia Father    Heart disease Father    Hypertension Father    COPD Father    Breast cancer Maternal Aunt 28   Breast cancer Paternal Aunt 38   Kidney cancer Neg Hx    Bladder Cancer Neg Hx    Prostate cancer Neg Hx    Ovarian cancer Neg Hx    Diabetes Neg Hx    Review of Systems:   Pertinent items are noted in HPI Denies any headaches, blurred vision, fatigue, shortness of breath, chest pain, abdominal pain, abnormal vaginal  discharge/itching/odor/irritation, problems with periods, bowel movements, urination, or intercourse unless otherwise stated above. Pertinent History Reviewed:  Reviewed past medical,surgical, social and family history.  Reviewed problem list, medications and allergies. Physical Assessment:   Vitals:   08/21/24 0856  BP: 137/86  Pulse: 99  Weight: 129 lb 1.6 oz (58.6 kg)  Height: 5' 2 (1.575 m)  Body mass index is 23.61 kg/m.       Physical Exam Vitals reviewed.  Constitutional:      General: She is not in acute distress.    Appearance: Normal appearance.  HENT:     Head: Normocephalic.  Neck:     Thyroid : No thyroid  mass or thyromegaly.  Cardiovascular:     Rate and Rhythm: Normal rate and regular rhythm.     Heart sounds: Normal heart sounds.  Pulmonary:     Effort: Pulmonary effort is normal.     Breath sounds: Normal breath sounds.  Chest:  Breasts:    Tanner Score is 5.     Right: Normal.     Left: Normal.  Abdominal:     General: Abdomen is flat.     Palpations: Abdomen is soft.     Tenderness: There is no abdominal tenderness.  Musculoskeletal:     Cervical back: Neck supple. No tenderness.  Lymphadenopathy:     Upper Body:     Right upper body: No axillary adenopathy.     Left upper body: No axillary adenopathy.  Skin:    General: Skin is warm and dry.  Neurological:     General: No focal deficit present.     Mental Status: She is alert and oriented to person, place, and time.  Psychiatric:        Mood and Affect: Mood normal.        Behavior: Behavior normal.      No results found for this or any previous visit (from the past 24 hours).  Assessment & Plan:  1. Well woman exam (Primary)  2. Breast cancer screening by mammogram  3. Midline cystocele  Pap: Due 07/2028, reviewed cessation of screening at 63yo without history of significant abnormal results. Mammogram: scheduled 08/22/24 Colonoscopy: 06/2028, q5y d/t family history, or sooner if  problems  No orders of the defined types were placed in this encounter.   Meds: No orders of the defined types were placed in this encounter.   Follow-up: Return in about 1 year (around 08/21/2025) for Annual exam.  Harlene  LITTIE Cisco, CNM 08/21/2024 8:58 AM  "

## 2024-08-21 NOTE — Patient Instructions (Signed)
 How to Do a Breast Self-Exam Doing breast self-exams can help you stay healthy. They're one way to know what's normal for your breasts. They can help you catch a problem while it's still small and can be treated. You need to: Check your breasts often. Tell your doctor about any changes. You should do breast self-exams even if you have breast implants. What you need: A mirror. A well-lit room. A pillow or other soft object. How to do a breast self-exam Look for changes  Take off all the clothes above your waist. Stand in front of a mirror in a room with good lighting. Put your hands down at your sides. Compare your breasts in the mirror. Look for difference between them, such as: Differences in shape. Differences in size. Wrinkles, dips, and bumps in one breast and not the other. Look at each breast for skin changes, such as: Redness. Scaly spots. Spots where your skin is thicker. Dimpling. Open sores. Look for changes in your nipples, such as: Fluid coming out of a nipple. Fluid around a nipple. Bleeding. Dimpling. Redness. A nipple that looks pushed in or that has changed position. Feel for changes Lie on your back. Feel each breast. To do this: Pick a breast to feel. Place a pillow under the shoulder closest to that breast. Put the arm closest to that breast behind your head. Feel the breast using the hand of your other arm. Use the pads of your three middle fingers to make small circles starting near the nipple. Use light, medium, and firm pressure. Keep making circles, moving down over the breast. Stop when you feel your ribs. Start making circles with your fingers again, this time going up until you reach your collarbone. Then, make circles out across your breast and into your armpit area. Squeeze your nipple. Check for fluid and lumps. Do these steps again to check your other breast. Sit or stand in the tub or shower. With soapy water on your skin, feel each breast  the same way you did when you were lying down. Write down what you find Writing down what you find can help you keep track of what you want to tell your doctor. Write down: What's normal for each breast. Any changes you find. Write down: The kind of change. If your breast feels tender or painful. Any lump you find. Write down its size and where it is. When you last had your period. General tips If you're breastfeeding, the best time to check your breasts is after you feed your baby or after you use a breast pump. If you get a period, the best time to check your breasts is 5-7 days after your period ends. With time, you'll get more used to doing the self-exam. You'll also start to know if there are changes in your breasts. Contact a doctor if: You see a change in the shape or size of your breasts or nipples. You see a change in the skin of your breast or nipples. You have fluid coming from your nipples that isn't normal. You find a new lump or thick area. You have breast pain. You have any concerns about your breast health. This information is not intended to replace advice given to you by your health care provider. Make sure you discuss any questions you have with your health care provider. Document Revised: 10/19/2023 Document Reviewed: 10/19/2023 Elsevier Patient Education  2025 [employer].  Preventive Care 40-58 Years Old, Female Preventive care refers to lifestyle choices  and visits with your health care provider that can promote health and wellness. Preventive care visits are also called wellness exams. What can I expect for my preventive care visit? Counseling Your health care provider may ask you questions about your: Medical history, including: Past medical problems. Family medical history. Pregnancy history. Current health, including: Menstrual cycle. Method of birth control. Emotional well-being. Home life and relationship well-being. Sexual activity and sexual  health. Lifestyle, including: Alcohol, nicotine or tobacco, and drug use. Access to firearms. Diet, exercise, and sleep habits. Work and work Astronomer. Sunscreen use. Safety issues such as seatbelt and bike helmet use. Physical exam Your health care provider will check your: Height and weight. These may be used to calculate your BMI (body mass index). BMI is a measurement that tells if you are at a healthy weight. Waist circumference. This measures the distance around your waistline. This measurement also tells if you are at a healthy weight and may help predict your risk of certain diseases, such as type 2 diabetes and high blood pressure. Heart rate and blood pressure. Body temperature. Skin for abnormal spots. What immunizations do I need?  Vaccines are usually given at various ages, according to a schedule. Your health care provider will recommend vaccines for you based on your age, medical history, and lifestyle or other factors, such as travel or where you work. What tests do I need? Screening Your health care provider may recommend screening tests for certain conditions. This may include: Lipid and cholesterol levels. Diabetes screening. This is done by checking your blood sugar (glucose) after you have not eaten for a while (fasting). Pelvic exam and Pap test. Hepatitis B test. Hepatitis C test. HIV (human immunodeficiency virus) test. STI (sexually transmitted infection) testing, if you are at risk. Lung cancer screening. Colorectal cancer screening. Mammogram. Talk with your health care provider about when you should start having regular mammograms. This may depend on whether you have a family history of breast cancer. BRCA-related cancer screening. This may be done if you have a family history of breast, ovarian, tubal, or peritoneal cancers. Bone density scan. This is done to screen for osteoporosis. Talk with your health care provider about your test results,  treatment options, and if necessary, the need for more tests. Follow these instructions at home: Eating and drinking  Eat a diet that includes fresh fruits and vegetables, whole grains, lean protein, and low-fat dairy products. Take vitamin and mineral supplements as recommended by your health care provider. Do not drink alcohol if: Your health care provider tells you not to drink. You are pregnant, may be pregnant, or are planning to become pregnant. If you drink alcohol: Limit how much you have to 0-1 drink a day. Know how much alcohol is in your drink. In the [place] one drink equals one 12 oz bottle of beer (355 mL), one 5 oz glass of wine (148 mL), or one 1 oz glass of hard liquor (44 mL). Lifestyle Brush your teeth every morning and night with fluoride toothpaste. Floss one time each day. Exercise for at least 30 minutes 5 or more days each week. Do not use any products that contain nicotine or tobacco. These products include cigarettes, chewing tobacco, and vaping devices, such as e-cigarettes. If you need help quitting, ask your health care provider. Do not use drugs. If you are sexually active, practice safe sex. Use a condom or other form of protection to prevent STIs. If you do not wish to become pregnant, use  a form of birth control. If you plan to become pregnant, see your health care provider for a prepregnancy visit. Take aspirin only as told by your health care provider. Make sure that you understand how much to take and what form to take. Work with your health care provider to find out whether it is safe and beneficial for you to take aspirin daily. Find healthy ways to manage stress, such as: Meditation, yoga, or listening to music. Journaling. Talking to a trusted person. Spending time with friends and family. Minimize exposure to UV radiation to reduce your risk of skin cancer. Safety Always wear your seat belt while driving or riding in a vehicle. Do not drive: If you  have been drinking alcohol. Do not ride with someone who has been drinking. When you are tired or distracted. While texting. If you have been using any mind-altering substances or drugs. Wear a helmet and other protective equipment during sports activities. If you have firearms in your house, make sure you follow all gun safety procedures. Seek help if you have been physically or sexually abused. What's next? Visit your health care provider once a year for an annual wellness visit. Ask your health care provider how often you should have your eyes and teeth checked. Stay up to date on all vaccines. This information is not intended to replace advice given to you by your health care provider. Make sure you discuss any questions you have with your health care provider. Document Revised: 02/04/2021 Document Reviewed: 02/04/2021 Elsevier Patient Education  2024 [employer].

## 2024-08-22 ENCOUNTER — Ambulatory Visit
Admission: RE | Admit: 2024-08-22 | Discharge: 2024-08-22 | Disposition: A | Discharge status: Home / Self Care | Source: Ambulatory Visit | Attending: Family Medicine | Admitting: Family Medicine

## 2024-08-22 DIAGNOSIS — Z1231 Encounter for screening mammogram for malignant neoplasm of breast: Secondary | ICD-10-CM | POA: Diagnosis present

## 2024-09-10 ENCOUNTER — Other Ambulatory Visit: Payer: Self-pay

## 2024-09-10 ENCOUNTER — Other Ambulatory Visit (HOSPITAL_COMMUNITY): Payer: Self-pay

## 2024-09-10 ENCOUNTER — Encounter: Payer: Self-pay | Admitting: Pharmacist

## 2024-09-10 ENCOUNTER — Other Ambulatory Visit (HOSPITAL_BASED_OUTPATIENT_CLINIC_OR_DEPARTMENT_OTHER): Payer: Self-pay

## 2024-09-11 ENCOUNTER — Other Ambulatory Visit: Payer: Self-pay

## 2025-01-02 ENCOUNTER — Ambulatory Visit: Admitting: Urology
